# Patient Record
Sex: Female | Born: 1945
Health system: Southern US, Community
[De-identification: ages and names within clinical notes are randomized; demographics above are authoritative.]

## PROBLEM LIST (undated history)

## (undated) DIAGNOSIS — E785 Hyperlipidemia, unspecified: Secondary | ICD-10-CM

## (undated) DIAGNOSIS — I728 Aneurysm of other specified arteries: Secondary | ICD-10-CM

## (undated) DIAGNOSIS — E119 Type 2 diabetes mellitus without complications: Secondary | ICD-10-CM

## (undated) DIAGNOSIS — H269 Unspecified cataract: Secondary | ICD-10-CM

## (undated) DIAGNOSIS — G2581 Restless legs syndrome: Secondary | ICD-10-CM

## (undated) DIAGNOSIS — T7840XA Allergy, unspecified, initial encounter: Secondary | ICD-10-CM

## (undated) DIAGNOSIS — I10 Essential (primary) hypertension: Secondary | ICD-10-CM

## (undated) HISTORY — DX: Hyperlipidemia, unspecified: E78.5

## (undated) HISTORY — DX: Type 2 diabetes mellitus without complications: E11.9

## (undated) HISTORY — DX: Allergy, unspecified, initial encounter: T78.40XA

## (undated) HISTORY — DX: Aneurysm of other specified arteries: I72.8

## (undated) HISTORY — DX: Restless legs syndrome: G25.81

## (undated) HISTORY — DX: Unspecified cataract: H26.9

## (undated) HISTORY — DX: Essential (primary) hypertension: I10

## (undated) HISTORY — PX: OVARIAN CYST REMOVAL: SHX89

---

## 2012-11-11 ENCOUNTER — Ambulatory Visit (INDEPENDENT_AMBULATORY_CARE_PROVIDER_SITE_OTHER): Payer: BC Managed Care – PPO | Admitting: Family Medicine

## 2012-11-11 ENCOUNTER — Encounter: Payer: Self-pay | Admitting: Family Medicine

## 2012-11-11 VITALS — BP 124/76 | HR 94 | Temp 97.6°F | Ht 62.5 in | Wt 179.6 lb

## 2012-11-11 DIAGNOSIS — E785 Hyperlipidemia, unspecified: Secondary | ICD-10-CM | POA: Insufficient documentation

## 2012-11-11 DIAGNOSIS — I152 Hypertension secondary to endocrine disorders: Secondary | ICD-10-CM | POA: Insufficient documentation

## 2012-11-11 DIAGNOSIS — K219 Gastro-esophageal reflux disease without esophagitis: Secondary | ICD-10-CM | POA: Insufficient documentation

## 2012-11-11 DIAGNOSIS — G2581 Restless legs syndrome: Secondary | ICD-10-CM | POA: Insufficient documentation

## 2012-11-11 DIAGNOSIS — I10 Essential (primary) hypertension: Secondary | ICD-10-CM

## 2012-11-11 DIAGNOSIS — E119 Type 2 diabetes mellitus without complications: Secondary | ICD-10-CM

## 2012-11-11 DIAGNOSIS — E1159 Type 2 diabetes mellitus with other circulatory complications: Secondary | ICD-10-CM | POA: Insufficient documentation

## 2012-11-11 LAB — BASIC METABOLIC PANEL WITH GFR
BUN: 16 mg/dL (ref 6–23)
CO2: 26 mEq/L (ref 19–32)
Calcium: 9.9 mg/dL (ref 8.4–10.5)
Chloride: 104 mEq/L (ref 96–112)
Creat: 0.87 mg/dL (ref 0.50–1.10)
GFR, Est African American: 81 mL/min
GFR, Est Non African American: 71 mL/min
Glucose, Bld: 90 mg/dL (ref 70–99)
Potassium: 4.4 mEq/L (ref 3.5–5.3)
Sodium: 140 mEq/L (ref 135–145)

## 2012-11-11 LAB — HEPATIC FUNCTION PANEL
ALT: 18 U/L (ref 0–35)
AST: 17 U/L (ref 0–37)
Albumin: 4.3 g/dL (ref 3.5–5.2)
Alkaline Phosphatase: 61 U/L (ref 39–117)
Bilirubin, Direct: <0.1 mg/dL (ref 0.0–0.3)
Total Bilirubin: 0.3 mg/dL (ref 0.3–1.2)
Total Protein: 7 g/dL (ref 6.0–8.3)

## 2012-11-11 LAB — POCT UA - MICROALBUMIN: Microalbumin Ur, POC: NEGATIVE mg/dL

## 2012-11-11 LAB — POCT GLYCOSYLATED HEMOGLOBIN (HGB A1C): Hemoglobin A1C: 6.1

## 2012-11-11 MED ORDER — LISINOPRIL 5 MG PO TABS: 5.0000 mg | ORAL_TABLET | Freq: Every day | ORAL | Status: DC

## 2012-11-11 MED ORDER — PRAMIPEXOLE DIHYDROCHLORIDE 0.25 MG PO TABS: 0.2500 mg | ORAL_TABLET | Freq: Every day | ORAL | Status: DC

## 2012-11-11 MED ORDER — METFORMIN HCL 500 MG PO TABS: 500.0000 mg | ORAL_TABLET | Freq: Every day | ORAL | Status: DC

## 2012-11-11 MED ORDER — RANITIDINE HCL 150 MG PO TABS: 150.0000 mg | ORAL_TABLET | Freq: Two times a day (BID) | ORAL | Status: DC

## 2012-11-11 MED ORDER — TRAZODONE HCL 50 MG PO TABS: 50.0000 mg | ORAL_TABLET | Freq: Every day | ORAL | Status: DC

## 2012-11-11 MED ORDER — SIMVASTATIN 20 MG PO TABS: 20.0000 mg | ORAL_TABLET | Freq: Every evening | ORAL | Status: DC

## 2012-11-11 NOTE — Progress Notes (Signed)
Patient ID: Theresa Wilson, female   DOB: 02/03/1946, 67 y.o.   MRN: [id number] SUBJECTIVE: HPI: Patient is here for follow up of hypertension, hyperlipidemia/ diabetes/restless legs syndrome/GERD: denies Headache;deniesChest Pain;denies weakness;denies Shortness of Breath or Orthopnea;denies Visual changes;denies palpitations;denies cough;denies pedal edema;denies symptoms of TIA or stroke; admits to Compliance with medications. admits to Problems with medications.Blood pressure was low SBP was 80s. D/c the hctz. And  Reduced the lisinopril. Feels fine now. Came to get labs, and recheck BP.   PMH/PSH: reviewed/updated in Epic  SH/FH: reviewed/updated in Epic  Allergies: reviewed/updated in Epic  Medications: reviewed/updated in Epic  Immunizations: reviewed/updated in Epic  ROS: As above in the HPI. All other systems are stable or negative.  OBJECTIVE: APPEARANCE:  Patient in no acute distress.The patient appeared well nourished and normally developed. Acyanotic. Waist: VITAL SIGNS:BP 124/76  Pulse 94  Temp(Src) 97.6 F (36.4 C) (Oral)  Ht 5' 2.5" (1.588 m)  Wt 179 lb 9.6 oz (81.466 kg)  BMI 32.31 kg/m2  BP is tilt negative  SKIN: warm and  Dry without overt rashes, tattoos and scars  HEAD and Neck: without JVD, Head and scalp: normal Eyes:No scleral icterus. Fundi normal, eye movements normal. Ears: Auricle normal, canal normal, Tympanic membranes normal, insufflation normal. Nose: normal Throat: normal Neck & thyroid: normal  CHEST & LUNGS: Chest wall: normal Lungs: Clear  CVS: Reveals the PMI to be normally located. Regular rhythm, First and Second Heart sounds are normal,  absence of murmurs, rubs or gallops. Peripheral vasculature: Radial pulses: normal Dorsal pedis pulses: normal Posterior pulses: normal  ABDOMEN:  Appearance: normal Benign,, no organomegaly, no masses, no Abdominal Aortic enlargement. No Guarding , no rebound. No Bruits. Bowel  sounds: normal  RECTAL: N/A GU: N/A  EXTREMETIES: nonedematous. Both Femoral and Pedal pulses are normal.  MUSCULOSKELETAL:  Spine: normal Joints: intact  NEUROLOGIC: oriented to time,place and person; nonfocal. Strength is normal Sensory is normal Reflexes are normal Cranial Nerves are normal.  ASSESSMENT: HTN (hypertension) - Plan: lisinopril (PRINIVIL,ZESTRIL) 5 MG tablet, BASIC METABOLIC PANEL WITH GFR  HLD (hyperlipidemia) - Plan: simvastatin (ZOCOR) 20 MG tablet, Hepatic function panel, NMR Lipoprofile with Lipids  DM (diabetes mellitus) - Plan: metFORMIN (GLUCOPHAGE) 500 MG tablet, POCT glycosylated hemoglobin (Hb A1C), POCT UA - Microalbumin  Restless legs syndrome - Plan: traZODone (DESYREL) 50 MG tablet, pramipexole (MIRAPEX) 0.25 MG tablet  GERD (gastroesophageal reflux disease) - Plan: ranitidine (ZANTAC) 150 MG tablet    PLAN:      Dr Woodroe Mode Recommendations  Diet and Exercise discussed with patient.  For nutrition information, I recommend books:  1).Eat to Live by Dr Monico Hoar. 2).Prevent and Reverse Heart Disease by Dr Suzzette Righter.  Exercise recommendations are:  If unable to walk, then the patient can exercise in a chair 3 times a day. By flapping arms like a bird gently and raising legs outwards to the front.  If ambulatory, the patient can go for walks for 30 minutes 3 times a week. Then increase the intensity and duration as tolerated.  Goal is to try to attain exercise frequency to 5 times a week.  If applicable: Best to perform resistance exercises (machines or weights) 2 days a week and cardio type exercises 3 days per week. Orders Placed This Encounter  Procedures  . BASIC METABOLIC PANEL WITH GFR  . Hepatic function panel  . NMR Lipoprofile with Lipids  . POCT glycosylated hemoglobin (Hb A1C)  . POCT UA - Microalbumin  Results for orders placed in visit on 11/11/12 (from the past 24 hour(s))  POCT GLYCOSYLATED  HEMOGLOBIN (HGB A1C)     Status: None   Collection Time    11/11/12  4:57 PM      Result Value Range   Hemoglobin A1C 6.1    POCT UA - MICROALBUMIN     Status: None   Collection Time    11/11/12  4:58 PM      Result Value Range   Microalbumin Ur, POC neg     Meds ordered this encounter  Medications  . DISCONTD: lisinopril (PRINIVIL,ZESTRIL) 10 MG tablet    Sig: Take 5 mg by mouth daily.  Marland Kitchen DISCONTD: traZODone (DESYREL) 50 MG tablet    Sig: Take 50 mg by mouth at bedtime.  Marland Kitchen DISCONTD: pramipexole (MIRAPEX) 0.25 MG tablet    Sig: Take 0.25 mg by mouth at bedtime.  Marland Kitchen DISCONTD: ranitidine (ZANTAC) 150 MG tablet    Sig: Take 150 mg by mouth 2 (two) times daily.  Marland Kitchen DISCONTD: simvastatin (ZOCOR) 20 MG tablet    Sig: Take 20 mg by mouth every evening.  Marland Kitchen DISCONTD: metFORMIN (GLUCOPHAGE) 500 MG tablet    Sig: Take 500 mg by mouth at bedtime.  . traZODone (DESYREL) 50 MG tablet    Sig: Take 1 tablet (50 mg total) by mouth at bedtime.    Dispense:  30 tablet    Refill:  5  . simvastatin (ZOCOR) 20 MG tablet    Sig: Take 1 tablet (20 mg total) by mouth every evening.    Dispense:  30 tablet    Refill:  11  . ranitidine (ZANTAC) 150 MG tablet    Sig: Take 1 tablet (150 mg total) by mouth 2 (two) times daily.    Dispense:  30 tablet    Refill:  11  . pramipexole (MIRAPEX) 0.25 MG tablet    Sig: Take 1 tablet (0.25 mg total) by mouth at bedtime.    Dispense:  30 tablet    Refill:  11  . metFORMIN (GLUCOPHAGE) 500 MG tablet    Sig: Take 1 tablet (500 mg total) by mouth at bedtime.    Dispense:  30 tablet    Refill:  11  . lisinopril (PRINIVIL,ZESTRIL) 5 MG tablet    Sig: Take 1 tablet (5 mg total) by mouth daily.    Dispense:  90 tablet    Refill:  3  RTC in 4 months.  Coraleigh Sheeran P. Modesto Charon, M.D.

## 2012-11-11 NOTE — Patient Instructions (Addendum)
      Dr Yashica Sterbenz's Recommendations  Diet and Exercise discussed with patient.  For nutrition information, I recommend books:  1).Eat to Live by Dr Joel Fuhrman. 2).Prevent and Reverse Heart Disease by Dr Caldwell Esselstyn.  Exercise recommendations are:  If unable to walk, then the patient can exercise in a chair 3 times a day. By flapping arms like a bird gently and raising legs outwards to the front.  If ambulatory, the patient can go for walks for 30 minutes 3 times a week. Then increase the intensity and duration as tolerated.  Goal is to try to attain exercise frequency to 5 times a week.  If applicable: Best to perform resistance exercises (machines or weights) 2 days a week and cardio type exercises 3 days per week.  

## 2012-11-12 LAB — NMR LIPOPROFILE WITH LIPIDS
Cholesterol, Total: 178 mg/dL (ref <200)
HDL Particle Number: 36.6 umol/L (ref >=30.5)
HDL Size: 8.4 nm — ABNORMAL LOW (ref >=9.2)
HDL-C: 51 mg/dL (ref >=40)
LDL (calc): 77 mg/dL (ref <100)
LDL Particle Number: 1608 nmol/L — ABNORMAL HIGH (ref <1000)
LDL Size: 20 nm — ABNORMAL LOW (ref >20.5)
LP-IR Score: 84 — ABNORMAL HIGH (ref <=45)
Large HDL-P: 1.6 umol/L — ABNORMAL LOW (ref >=4.8)
Large VLDL-P: 9.7 nmol/L — ABNORMAL HIGH (ref <=2.7)
Small LDL Particle Number: 1125 nmol/L — ABNORMAL HIGH (ref <=527)
Triglycerides: 252 mg/dL — ABNORMAL HIGH (ref <150)
VLDL Size: 53.1 nm — ABNORMAL HIGH (ref <=46.6)

## 2012-12-13 ENCOUNTER — Telehealth: Payer: Self-pay | Admitting: Family Medicine

## 2012-12-13 ENCOUNTER — Ambulatory Visit (INDEPENDENT_AMBULATORY_CARE_PROVIDER_SITE_OTHER): Payer: BC Managed Care – PPO | Admitting: General Practice

## 2012-12-13 ENCOUNTER — Encounter: Payer: Self-pay | Admitting: General Practice

## 2012-12-13 VITALS — BP 130/76 | HR 86 | Temp 97.1°F | Ht 63.0 in | Wt 179.0 lb

## 2012-12-13 DIAGNOSIS — L259 Unspecified contact dermatitis, unspecified cause: Secondary | ICD-10-CM

## 2012-12-13 DIAGNOSIS — L237 Allergic contact dermatitis due to plants, except food: Secondary | ICD-10-CM

## 2012-12-13 DIAGNOSIS — L255 Unspecified contact dermatitis due to plants, except food: Secondary | ICD-10-CM

## 2012-12-13 MED ORDER — PREDNISONE (PAK) 10 MG PO TABS: ORAL_TABLET | ORAL | Status: DC

## 2012-12-13 MED ORDER — METHYLPREDNISOLONE ACETATE 40 MG/ML IJ SUSP
40.0000 mg | Freq: Once | INTRAMUSCULAR | Status: AC
  Administered 2012-12-13: 40 mg via INTRAMUSCULAR

## 2012-12-13 NOTE — Telephone Encounter (Signed)
appt given  

## 2012-12-13 NOTE — Progress Notes (Signed)
  Subjective:    Patient ID: Theresa Wilson, female    DOB: Apr 02, 1946, 67 y.o.   MRN: [id number]  Poison [place] This is a new problem. The current episode started in the past 7 days. The problem has been gradually worsening since onset. The affected locations include the left upper leg, right upper leg, left arm and right arm. The rash is characterized by itchiness, redness and blistering. She was exposed to plant contact. Pertinent negatives include no congestion, cough, fever, rhinorrhea, shortness of breath or sore throat. Past treatments include antihistamine. There is no history of allergies or asthma.  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 Itching and rash was noticed on Tuesday or Wednesday.     Review of Systems  Constitutional: Negative for fever and chills.  HENT: Negative for congestion, sore throat and rhinorrhea.   Respiratory: Negative for cough, chest tightness and shortness of breath.   Cardiovascular: Negative for chest pain and palpitations.  Skin: Positive for rash.       Red rash to upper legs and upper arms  Neurological: Negative for dizziness and headaches.  Psychiatric/Behavioral: Negative.        Objective:   Physical Exam  Constitutional: She is oriented to person, place, and time. She appears well-developed and well-nourished.  Cardiovascular: Normal rate, regular rhythm and normal heart sounds.   Pulmonary/Chest: Effort normal and breath sounds normal. No respiratory distress. She exhibits no tenderness.  Neurological: She is alert and oriented to person, place, and time.  Skin: Skin is warm and dry. Rash noted.  Macular rash noted to upper legs and arms  Psychiatric: She has a normal mood and affect.          Assessment & Plan:  1. Poison oak dermatitis  - methylPREDNISolone acetate (DEPO-MEDROL) injection 40 mg; Inject 1 mL (40 mg total) into the muscle once. - predniSONE (STERAPRED UNI-PAK) 10 MG tablet; Take as directed. Start on  tomorrow, 12/14/12  Dispense: 21 tablet; Refill: 0  Avoid irritants Discussed prevention of spreading RTO is symptoms worsen or go to nearest emergency department Patient verbalized understanding Coralie Keens, FNP-C

## 2012-12-13 NOTE — Patient Instructions (Signed)
Poison Oak Poison oak is an inflammation of the skin (contact dermatitis). It is caused by contact with the allergens on the leaves of the oak (toxicodendron) plants. Depending on your sensitivity, the rash may consist simply of redness and itching, or it may also progress to blisters which may break open (rupture). These must be well cared for to prevent secondary germ (bacterial) infection as these infections can lead to scarring. The eyes may also get puffy. The puffiness is worst in the morning and gets better as the day progresses. Healing is best accomplished by keeping any open areas dry, clean, covered with a bandage, and covered with an antibacterial ointment if needed. Without secondary infection, this dermatitis usually heals without scarring within 2 to 3 weeks without treatment. HOME CARE INSTRUCTIONS When you have been exposed to poison oak, it is very important to thoroughly wash with soap and water as soon as the exposure has been discovered. You have about one half hour to remove the plant resin before it will cause the rash. This cleaning will quickly destroy the oil or antigen on the skin (the antigen is what causes the rash). Wash aggressively under the fingernails as any plant resin still there will continue to spread the rash. Do not rub skin vigorously when washing affected area. Poison oak cannot spread if no oil from the plant remains on your body. Rash that has progressed to weeping sores (lesions) will not spread the rash unless you have not washed thoroughly. It is also important to clean any clothes you have been wearing as they may carry active allergens which will spread the rash, even several days later. Avoidance of the plant in the future is the best measure. Poison oak plants can be recognized by the number of leaves. Generally, poison oak has three leaves with flowering branches on a single stem. Diphenhydramine may be purchased over the counter and used as needed for  itching. Do not drive with this medication if it makes you drowsy. Ask your caregiver about medication for children. SEEK IMMEDIATE MEDICAL CARE IF:   Open areas of the rash develop.  You notice redness extending beyond the area of the rash.  There is a pus like discharge.  There is increased pain.  Other signs of infection develop (such as fever). Document Released: 01/14/2003 Document Revised: 10/02/2011 Document Reviewed: 05/26/2009 [employer] Patient Information 2014 [employer].  

## 2013-03-13 ENCOUNTER — Encounter: Payer: Self-pay | Admitting: Family Medicine

## 2013-03-13 ENCOUNTER — Ambulatory Visit (INDEPENDENT_AMBULATORY_CARE_PROVIDER_SITE_OTHER): Payer: BC Managed Care – PPO

## 2013-03-13 ENCOUNTER — Ambulatory Visit (INDEPENDENT_AMBULATORY_CARE_PROVIDER_SITE_OTHER): Payer: BC Managed Care – PPO | Admitting: Family Medicine

## 2013-03-13 VITALS — BP 128/76 | HR 100 | Temp 98.1°F | Wt 178.0 lb

## 2013-03-13 DIAGNOSIS — I10 Essential (primary) hypertension: Secondary | ICD-10-CM

## 2013-03-13 DIAGNOSIS — Z7722 Contact with and (suspected) exposure to environmental tobacco smoke (acute) (chronic): Secondary | ICD-10-CM

## 2013-03-13 DIAGNOSIS — G2581 Restless legs syndrome: Secondary | ICD-10-CM

## 2013-03-13 DIAGNOSIS — E785 Hyperlipidemia, unspecified: Secondary | ICD-10-CM

## 2013-03-13 DIAGNOSIS — E119 Type 2 diabetes mellitus without complications: Secondary | ICD-10-CM

## 2013-03-13 DIAGNOSIS — M549 Dorsalgia, unspecified: Secondary | ICD-10-CM

## 2013-03-13 DIAGNOSIS — K219 Gastro-esophageal reflux disease without esophagitis: Secondary | ICD-10-CM

## 2013-03-13 DIAGNOSIS — Z9189 Other specified personal risk factors, not elsewhere classified: Secondary | ICD-10-CM

## 2013-03-13 LAB — POCT GLYCOSYLATED HEMOGLOBIN (HGB A1C): Hemoglobin A1C: 5.9

## 2013-03-13 MED ORDER — TRAZODONE HCL 50 MG PO TABS: 50.0000 mg | ORAL_TABLET | Freq: Every day | ORAL | Status: DC

## 2013-03-13 MED ORDER — MELOXICAM 15 MG PO TABS: 15.0000 mg | ORAL_TABLET | Freq: Every day | ORAL | Status: DC

## 2013-03-13 NOTE — Progress Notes (Signed)
Patient ID: Theresa Wilson, female   DOB: 03-19-1946, 67 y.o.   MRN: [id number] SUBJECTIVE: CC: Chief Complaint  Patient presents with  . Follow-up    4 month follow up refill trazadone  now sewing and c/o back pain and wants to discuss metformin    HPI:  Patient is here for follow up of Diabetes Mellitus: Symptoms evaluated: Denies Nocturia ,Denies Urinary Frequency , denies Blurred vision ,deniesDizziness,denies.Dysuria,denies paresthesias, denies extremity pain or ulcers.Marland Kitchendenies chest pain. has had an annual eye exam. do check the feet. Does check CBGs. Average CBG:98 Denies episodes of hypoglycemia. Does have an emergency hypoglycemic plan. admits toCompliance with medications. Denies Problems with medications.  Past Medical History  Diagnosis Date  . Diabetes mellitus without complication   . Hypertension   . Hyperlipidemia   . Restless leg syndrome    Past Surgical History  Procedure Laterality Date  . Ovarian cyst removal     History   Social History  . Marital Status: Divorced    Spouse Name: N/A    Number of Children: N/A  . Years of Education: N/A   Occupational History  . Not on file.   Social History Main Topics  . Smoking status: Former Smoker    Quit date: 11/11/2008  . Smokeless tobacco: Not on file  . Alcohol Use: No  . Drug Use: No  . Sexual Activity: Not on file   Other Topics Concern  . Not on file   Social History Narrative  . No narrative on file   Family History  Problem Relation Age of Onset  . Heart attack Mother   . Hypertension Father   . Heart disease Father    Current Outpatient Prescriptions on File Prior to Visit  Medication Sig Dispense Refill  . lisinopril (PRINIVIL,ZESTRIL) 5 MG tablet Take 1 tablet (5 mg total) by mouth daily.  90 tablet  3  . metFORMIN (GLUCOPHAGE) 500 MG tablet Take 1 tablet (500 mg total) by mouth at bedtime.  30 tablet  11  . pramipexole (MIRAPEX) 0.25 MG tablet Take 1 tablet (0.25 mg total) by  mouth at bedtime.  30 tablet  11  . ranitidine (ZANTAC) 150 MG tablet Take 1 tablet (150 mg total) by mouth 2 (two) times daily.  30 tablet  11  . simvastatin (ZOCOR) 20 MG tablet Take 1 tablet (20 mg total) by mouth every evening.  30 tablet  11  . traZODone (DESYREL) 50 MG tablet Take 1 tablet (50 mg total) by mouth at bedtime.  30 tablet  5   No current facility-administered medications on file prior to visit.   Allergies  Allergen Reactions  . Sulfa Antibiotics     There is no immunization history on file for this patient. Prior to Admission medications   Medication Sig Start Date End Date Taking? Authorizing Provider  lisinopril (PRINIVIL,ZESTRIL) 5 MG tablet Take 1 tablet (5 mg total) by mouth daily. 11/11/12  Yes Ileana Ladd, MD  metFORMIN (GLUCOPHAGE) 500 MG tablet Take 1 tablet (500 mg total) by mouth at bedtime. 11/11/12  Yes Ileana Ladd, MD  pramipexole (MIRAPEX) 0.25 MG tablet Take 1 tablet (0.25 mg total) by mouth at bedtime. 11/11/12  Yes Ileana Ladd, MD  ranitidine (ZANTAC) 150 MG tablet Take 1 tablet (150 mg total) by mouth 2 (two) times daily. 11/11/12  Yes Ileana Ladd, MD  simvastatin (ZOCOR) 20 MG tablet Take 1 tablet (20 mg total) by mouth every evening. 11/11/12  Yes  Ileana Ladd, MD  traZODone (DESYREL) 50 MG tablet Take 1 tablet (50 mg total) by mouth at bedtime. 11/11/12  Yes Ileana Ladd, MD     ROS: As above in the HPI. All other systems are stable or negative.  OBJECTIVE: APPEARANCE:  Patient in no acute distress.The patient appeared well nourished and normally developed. Acyanotic. Waist: VITAL SIGNS:BP 128/76  Pulse 100  Temp(Src) 98.1 F (36.7 C) (Oral)  Wt 178 lb (80.74 kg)  BMI 31.54 kg/m2  WF  SKIN: warm and  Dry without overt rashes, tattoos and scars  HEAD and Neck: without JVD, Head and scalp: normal Eyes:No scleral icterus. Fundi normal, eye movements normal. Ears: Auricle normal, canal normal, Tympanic membranes normal,  insufflation normal. Nose: normal Throat: normal Neck & thyroid: normal  CHEST & LUNGS: Chest wall: normal Lungs: Clear  CVS: Reveals the PMI to be normally located. Regular rhythm, First and Second Heart sounds are normal,  absence of murmurs, rubs or gallops. Peripheral vasculature: Radial pulses: normal Dorsal pedis pulses: normal Posterior pulses: normal  ABDOMEN:  Appearance: overweight Benign, no organomegaly, no masses, no Abdominal Aortic enlargement. No Guarding , no rebound. No Bruits. Bowel sounds: normal  RECTAL: N/A GU: N/A  EXTREMETIES: nonedematous.  MUSCULOSKELETAL:  Spine: normal Joints: intact  NEUROLOGIC: oriented to time,place and person; nonfocal. Strength is normal Sensory is normal Reflexes are normal Cranial Nerves are normal.  ASSESSMENT: Second hand smoke exposure - Plan: DG Chest 2 View  GERD (gastroesophageal reflux disease)  HLD (hyperlipidemia) - Plan: CMP14+EGFR, NMR, lipoprofile  HTN (hypertension)  Restless legs syndrome - Plan: traZODone (DESYREL) 50 MG tablet  DM (diabetes mellitus) - Plan: POCT glycosylated hemoglobin (Hb A1C), CMP14+EGFR  Back pain - Plan: meloxicam (MOBIC) 15 MG tablet  PLAN: [facility] reading (PRIMARY) by  Dr. Modesto Charon: no acute disease seen.  Orders Placed This Encounter  Procedures  . DG Chest 2 View    Standing Status: Future     Number of Occurrences: 1     Standing Expiration Date: 05/13/2014    Order Specific Question:  Reason for Exam (SYMPTOM  OR DIAGNOSIS REQUIRED)    Answer:  second hand smoke    Order Specific Question:  Preferred imaging location?    Answer:  Internal  . CMP14+EGFR  . NMR, lipoprofile  . POCT glycosylated hemoglobin (Hb A1C)    Meds ordered this encounter  Medications  . meloxicam (MOBIC) 15 MG tablet    Sig: Take 1 tablet (15 mg total) by mouth daily.    Dispense:  30 tablet    Refill:  0  . traZODone (DESYREL) 50 MG tablet    Sig: Take 1 tablet (50 mg total) by  mouth at bedtime.    Dispense:  30 tablet    Refill:  5    Medications Discontinued During This Encounter  Medication Reason  . predniSONE (STERAPRED UNI-PAK) 10 MG tablet Completed Course  . traZODone (DESYREL) 50 MG tablet Reorder    Results for orders placed in visit on 03/13/13  POCT GLYCOSYLATED HEMOGLOBIN (HGB A1C)      Result Value Range   Hemoglobin A1C 5.9%           Dr Woodroe Mode Recommendations  For nutrition information, I recommend books:  1).Eat to Live by Dr Monico Hoar. 2).Prevent and Reverse Heart Disease by Dr Suzzette Righter. 3) Dr Katherina Right Book:  Program to Reverse Diabetes  Exercise recommendations are:  If unable to walk, then the patient  can exercise in a chair 3 times a day. By flapping arms like a bird gently and raising legs outwards to the front.  If ambulatory, the patient can go for walks for 30 minutes 3 times a week. Then increase the intensity and duration as tolerated.  Goal is to try to attain exercise frequency to 5 times a week.  If applicable: Best to perform resistance exercises (machines or weights) 2 days a week and cardio type exercises 3 days per week.  Return in about 4 months (around 07/13/2013) for Recheck medical problems.  Jesi Jurgens P. Modesto Charon, M.D.

## 2013-03-13 NOTE — Patient Instructions (Addendum)
      Dr Francis Wong's Recommendations  For nutrition information, I recommend books:  1).Eat to Live by Dr Joel Fuhrman. 2).Prevent and Reverse Heart Disease by Dr Caldwell Esselstyn. 3) Dr Neal Barnard's Book:  Program to Reverse Diabetes  Exercise recommendations are:  If unable to walk, then the patient can exercise in a chair 3 times a day. By flapping arms like a bird gently and raising legs outwards to the front.  If ambulatory, the patient can go for walks for 30 minutes 3 times a week. Then increase the intensity and duration as tolerated.  Goal is to try to attain exercise frequency to 5 times a week.  If applicable: Best to perform resistance exercises (machines or weights) 2 days a week and cardio type exercises 3 days per week.  

## 2013-03-14 LAB — CMP14+EGFR
ALT: 19 IU/L (ref 0–32)
AST: 18 IU/L (ref 0–40)
Albumin/Globulin Ratio: 2.3 (ref 1.1–2.5)
Albumin: 4.5 g/dL (ref 3.6–4.8)
Alkaline Phosphatase: 70 IU/L (ref 39–117)
BUN/Creatinine Ratio: 19 (ref 11–26)
BUN: 16 mg/dL (ref 8–27)
CO2: 23 mmol/L (ref 18–29)
Calcium: 9.9 mg/dL (ref 8.6–10.2)
Chloride: 100 mmol/L (ref 97–108)
Creatinine, Ser: 0.85 mg/dL (ref 0.57–1.00)
GFR calc Af Amer: 84 mL/min/{1.73_m2} (ref >59)
GFR calc non Af Amer: 73 mL/min/{1.73_m2} (ref >59)
Globulin, Total: 2 g/dL (ref 1.5–4.5)
Glucose: 136 mg/dL — ABNORMAL HIGH (ref 65–99)
Potassium: 4.2 mmol/L (ref 3.5–5.2)
Sodium: 139 mmol/L (ref 134–144)
Total Bilirubin: 0.2 mg/dL (ref 0.0–1.2)
Total Protein: 6.5 g/dL (ref 6.0–8.5)

## 2013-03-14 LAB — NMR, LIPOPROFILE
Cholesterol: 178 mg/dL (ref <200)
HDL Cholesterol by NMR: 48 mg/dL (ref >=40)
HDL Particle Number: 29.9 umol/L — ABNORMAL LOW (ref >=30.5)
LDL Particle Number: 1364 nmol/L — ABNORMAL HIGH (ref <1000)
LDL Size: 20 nm — ABNORMAL LOW (ref >20.5)
LDLC SERPL CALC-MCNC: 78 mg/dL (ref <100)
LP-IR Score: 78 — ABNORMAL HIGH (ref <=45)
Small LDL Particle Number: 986 nmol/L — ABNORMAL HIGH (ref <=527)
Triglycerides by NMR: 259 mg/dL — ABNORMAL HIGH (ref <150)

## 2013-03-14 NOTE — Progress Notes (Signed)
Quick Note:  Lab result at or close to goal. Can reduce the metformin to 1/2 tablet daily The triglycerides were very high and that is due to carbohydrates and sugars. We can wait to see the effect of dieting does or we can start on omega-3 fish oil 2 gm twice a Day OTC. No Change in plans for follow up. ______

## 2013-03-18 ENCOUNTER — Telehealth: Payer: Self-pay | Admitting: Family Medicine

## 2013-03-19 NOTE — Telephone Encounter (Signed)
Called several times and called this am and left her VM on her cell phone with Dr Nash Dimmer recommendations and copy of labs mailed to pt. Advised to return call after receiving labs if any questions

## 2013-04-08 ENCOUNTER — Other Ambulatory Visit: Payer: Self-pay | Admitting: Family Medicine

## 2013-04-09 NOTE — Telephone Encounter (Signed)
LAST OV 03/13/13

## 2013-04-09 NOTE — Telephone Encounter (Signed)
Prescription renewed in EPIC. 

## 2013-05-07 ENCOUNTER — Other Ambulatory Visit: Payer: Self-pay | Admitting: Family Medicine

## 2013-05-29 ENCOUNTER — Other Ambulatory Visit: Payer: Self-pay

## 2013-07-15 ENCOUNTER — Ambulatory Visit (INDEPENDENT_AMBULATORY_CARE_PROVIDER_SITE_OTHER): Payer: Medicare PPO | Admitting: Family Medicine

## 2013-07-15 ENCOUNTER — Encounter: Payer: Self-pay | Admitting: Family Medicine

## 2013-07-15 VITALS — BP 149/84 | HR 82 | Temp 97.4°F | Ht 63.0 in | Wt 171.2 lb

## 2013-07-15 DIAGNOSIS — G2581 Restless legs syndrome: Secondary | ICD-10-CM

## 2013-07-15 DIAGNOSIS — Z23 Encounter for immunization: Secondary | ICD-10-CM | POA: Insufficient documentation

## 2013-07-15 DIAGNOSIS — E785 Hyperlipidemia, unspecified: Secondary | ICD-10-CM

## 2013-07-15 DIAGNOSIS — K219 Gastro-esophageal reflux disease without esophagitis: Secondary | ICD-10-CM

## 2013-07-15 DIAGNOSIS — E119 Type 2 diabetes mellitus without complications: Secondary | ICD-10-CM

## 2013-07-15 DIAGNOSIS — I10 Essential (primary) hypertension: Secondary | ICD-10-CM

## 2013-07-15 LAB — POCT GLYCOSYLATED HEMOGLOBIN (HGB A1C): Hemoglobin A1C: 5.7

## 2013-07-15 MED ORDER — PNEUMOCOCCAL 13-VAL CONJ VACC IM SUSP: 0.5000 mL | Freq: Once | INTRAMUSCULAR | Status: DC

## 2013-07-15 NOTE — Patient Instructions (Signed)

## 2013-07-15 NOTE — Progress Notes (Signed)
Patient ID: Theresa Wilson, female   DOB: October 25, 1945, 67 y.o.   MRN: [id number] SUBJECTIVE: CC: Chief Complaint  Patient presents with  . Follow-up    states stopped taking bp med  ck fiingers     HPI: Patient is here for follow up of Diabetes Mellitus/HLD/HTN: Symptoms evaluated: Denies Nocturia ,Denies Urinary Frequency , denies Blurred vision ,deniesDizziness,denies.Dysuria,denies paresthesias, denies extremity pain or ulcers.Marland Kitchendenies chest pain. has had an annual eye exam. do check the feet. Does check CBGs. Average CBG:106 Denies episodes of hypoglycemia. Does have an emergency hypoglycemic plan. admits toCompliance with medications. Problems with medications.was dizzy and sick every morning with a low BP, so she stopped the lisinopril   Past Medical History  Diagnosis Date  . Diabetes mellitus without complication   . Hypertension   . Hyperlipidemia   . Restless leg syndrome    Past Surgical History  Procedure Laterality Date  . Ovarian cyst removal     History   Social History  . Marital Status: Divorced    Spouse Name: N/A    Number of Children: N/A  . Years of Education: N/A   Occupational History  . Not on file.   Social History Main Topics  . Smoking status: Former Smoker    Quit date: 11/11/2008  . Smokeless tobacco: Not on file  . Alcohol Use: No  . Drug Use: No  . Sexual Activity: Not on file   Other Topics Concern  . Not on file   Social History Narrative  . No narrative on file   Family History  Problem Relation Age of Onset  . Heart attack Mother   . Hypertension Father   . Heart disease Father    Current Outpatient Prescriptions on File Prior to Visit  Medication Sig Dispense Refill  . meloxicam (MOBIC) 15 MG tablet TAKE 1 TABLET (15 MG TOTAL) BY MOUTH DAILY.  30 tablet  3  . pramipexole (MIRAPEX) 0.25 MG tablet Take 1 tablet (0.25 mg total) by mouth at bedtime.  30 tablet  11  . ranitidine (ZANTAC) 150 MG tablet Take 1 tablet (150 mg  total) by mouth 2 (two) times daily.  30 tablet  11  . simvastatin (ZOCOR) 20 MG tablet Take 1 tablet (20 mg total) by mouth every evening.  30 tablet  11  . traZODone (DESYREL) 50 MG tablet Take 1 tablet (50 mg total) by mouth at bedtime.  30 tablet  5   No current facility-administered medications on file prior to visit.   Allergies  Allergen Reactions  . Sulfa Antibiotics    Immunization History  Administered Date(s) Administered  . Influenza-Unspecified 04/23/2013  . Pneumococcal Conjugate-13 07/15/2013   Prior to Admission medications   Medication Sig Start Date End Date Taking? Authorizing Provider  lisinopril (PRINIVIL,ZESTRIL) 5 MG tablet Take 1 tablet (5 mg total) by mouth daily. 11/11/12   Ileana Ladd, MD  meloxicam (MOBIC) 15 MG tablet TAKE 1 TABLET (15 MG TOTAL) BY MOUTH DAILY. 04/08/13   Ileana Ladd, MD  metFORMIN (GLUCOPHAGE) 500 MG tablet Take 1 tablet (500 mg total) by mouth at bedtime. 11/11/12   Ileana Ladd, MD  pramipexole (MIRAPEX) 0.25 MG tablet Take 1 tablet (0.25 mg total) by mouth at bedtime. 11/11/12   Ileana Ladd, MD  ranitidine (ZANTAC) 150 MG tablet Take 1 tablet (150 mg total) by mouth 2 (two) times daily. 11/11/12   Ileana Ladd, MD  simvastatin (ZOCOR) 20 MG tablet Take 1 tablet (  20 mg total) by mouth every evening. 11/11/12   Ileana Ladd, MD  traZODone (DESYREL) 50 MG tablet Take 1 tablet (50 mg total) by mouth at bedtime. 03/13/13   Ileana Ladd, MD     ROS: As above in the HPI. All other systems are stable or negative.  OBJECTIVE: APPEARANCE:  Patient in no acute distress.The patient appeared well nourished and normally developed. Acyanotic. Waist: VITAL SIGNS:BP 149/84  Pulse 82  Temp(Src) 97.4 F (36.3 C) (Oral)  Ht 5\' 3"  (1.6 m)  Wt 171 lb 3.2 oz (77.656 kg)  BMI 30.33 kg/m2 WF  SKIN: warm and  Dry without overt rashes, tattoos and scars  HEAD and Neck: without JVD, Head and scalp: normal Eyes:No scleral icterus. Fundi  normal, eye movements normal. Ears: Auricle normal, canal normal, Tympanic membranes normal, insufflation normal. Nose: normal Throat: normal Neck & thyroid: normal  CHEST & LUNGS: Chest wall: normal Lungs: Clear  CVS: Reveals the PMI to be normally located. Regular rhythm, First and Second Heart sounds are normal,  absence of murmurs, rubs or gallops. Peripheral vasculature: Radial pulses: normal Dorsal pedis pulses: normal Posterior pulses: normal  ABDOMEN:  Appearance: obese Benign, no organomegaly, no masses, no Abdominal Aortic enlargement. No Guarding , no rebound. No Bruits. Bowel sounds: normal  RECTAL: N/A GU: N/A  EXTREMETIES: nonedematous.  MUSCULOSKELETAL:  Spine: normal Joints: intact  NEUROLOGIC: oriented to time,place and person; nonfocal. Strength is normal Sensory is normal Reflexes are normal Cranial Nerves are normal.  ASSESSMENT: DM (diabetes mellitus) - Plan: POCT glycosylated hemoglobin (Hb A1C)  HTN (hypertension) - Plan: CMP14+EGFR  HLD (hyperlipidemia) - Plan: CMP14+EGFR, NMR, lipoprofile  Restless legs syndrome  GERD (gastroesophageal reflux disease)  Need for pneumococcal vaccination - Plan: pneumococcal 13-valent conjugate vaccine (PREVNAR 13) injection 0.5 mL  Off of lisinopril.  PLAN: Discussed diet and exercise.  Monitor BP daily.  Orders Placed This Encounter  Procedures  . CMP14+EGFR  . NMR, lipoprofile  . POCT glycosylated hemoglobin (Hb A1C)   Meds ordered this encounter  Medications  . metFORMIN (GLUCOPHAGE) 500 MG tablet    Sig: Take 250 mg by mouth.  . pneumococcal 13-valent conjugate vaccine (PREVNAR 13) injection 0.5 mL    Sig:    Medications Discontinued During This Encounter  Medication Reason  . metFORMIN (GLUCOPHAGE) 500 MG tablet   . lisinopril (PRINIVIL,ZESTRIL) 5 MG tablet Error   Return in about 4 months (around 11/13/2013), or if symptoms worsen or fail to improve, for Recheck medical  problems.  Linzy Darling P. [place] Charon, M.D.

## 2013-07-16 LAB — NMR, LIPOPROFILE
Cholesterol: 175 mg/dL (ref <200)
HDL Cholesterol by NMR: 52 mg/dL (ref >=40)
HDL Particle Number: 30.6 umol/L (ref >=30.5)
LDL Particle Number: 1337 nmol/L — ABNORMAL HIGH (ref <1000)
LDL Size: 20.4 nm — ABNORMAL LOW (ref >20.5)
LDLC SERPL CALC-MCNC: 95 mg/dL (ref <100)
LP-IR Score: 57 — ABNORMAL HIGH (ref <=45)
Small LDL Particle Number: 716 nmol/L — ABNORMAL HIGH (ref <=527)
Triglycerides by NMR: 140 mg/dL (ref <150)

## 2013-07-16 LAB — CMP14+EGFR
ALT: 18 IU/L (ref 0–32)
AST: 16 IU/L (ref 0–40)
Albumin/Globulin Ratio: 2.1 (ref 1.1–2.5)
Albumin: 4.8 g/dL (ref 3.6–4.8)
Alkaline Phosphatase: 99 IU/L (ref 39–117)
BUN/Creatinine Ratio: 15 (ref 11–26)
BUN: 14 mg/dL (ref 8–27)
CO2: 24 mmol/L (ref 18–29)
Calcium: 10.1 mg/dL (ref 8.6–10.2)
Chloride: 98 mmol/L (ref 97–108)
Creatinine, Ser: 0.93 mg/dL (ref 0.57–1.00)
GFR calc Af Amer: 75 mL/min/{1.73_m2} (ref >59)
GFR calc non Af Amer: 65 mL/min/{1.73_m2} (ref >59)
Globulin, Total: 2.3 g/dL (ref 1.5–4.5)
Glucose: 81 mg/dL (ref 65–99)
Potassium: 4.1 mmol/L (ref 3.5–5.2)
Sodium: 140 mmol/L (ref 134–144)
Total Bilirubin: 0.3 mg/dL (ref 0.0–1.2)
Total Protein: 7.1 g/dL (ref 6.0–8.5)

## 2013-08-08 ENCOUNTER — Other Ambulatory Visit: Payer: Self-pay | Admitting: Family Medicine

## 2013-09-30 ENCOUNTER — Other Ambulatory Visit: Payer: Self-pay | Admitting: Family Medicine

## 2013-10-27 ENCOUNTER — Other Ambulatory Visit: Payer: Self-pay | Admitting: Family Medicine

## 2013-10-28 LAB — HM DIABETES EYE EXAM

## 2013-10-31 NOTE — Telephone Encounter (Signed)
Call patient : Prescription refilled & sent to pharmacy in [place]. 

## 2013-11-17 ENCOUNTER — Other Ambulatory Visit: Payer: Self-pay | Admitting: Family Medicine

## 2013-11-18 NOTE — Telephone Encounter (Signed)
Last seen and last lipid 07/15/13  [facility]

## 2013-11-19 NOTE — Telephone Encounter (Signed)
Call patient : Prescription refilled & sent to pharmacy in [place]. 

## 2014-01-20 ENCOUNTER — Telehealth: Payer: Self-pay | Admitting: Family Medicine

## 2014-01-20 NOTE — Telephone Encounter (Signed)
Pt aware.

## 2014-01-20 NOTE — Telephone Encounter (Signed)
Patient states that her insurance sent her a letter stating that Dr.moore would take over her care she wants an appointment with him and all of her medicines she is out of. I called  To an appointment for diabetes intaitive. And she wanted it with Dr.Moore because of this letter.

## 2014-01-21 ENCOUNTER — Ambulatory Visit (INDEPENDENT_AMBULATORY_CARE_PROVIDER_SITE_OTHER): Payer: Medicare PPO | Admitting: Family Medicine

## 2014-01-21 ENCOUNTER — Encounter: Payer: Self-pay | Admitting: Family Medicine

## 2014-01-21 VITALS — BP 138/83 | HR 90 | Temp 98.1°F | Ht 63.0 in | Wt 182.0 lb

## 2014-01-21 DIAGNOSIS — M25549 Pain in joints of unspecified hand: Secondary | ICD-10-CM

## 2014-01-21 DIAGNOSIS — Z13820 Encounter for screening for osteoporosis: Secondary | ICD-10-CM

## 2014-01-21 DIAGNOSIS — I10 Essential (primary) hypertension: Secondary | ICD-10-CM

## 2014-01-21 DIAGNOSIS — M6530 Trigger finger, unspecified finger: Secondary | ICD-10-CM | POA: Insufficient documentation

## 2014-01-21 DIAGNOSIS — E119 Type 2 diabetes mellitus without complications: Secondary | ICD-10-CM

## 2014-01-21 DIAGNOSIS — M67919 Unspecified disorder of synovium and tendon, unspecified shoulder: Secondary | ICD-10-CM

## 2014-01-21 DIAGNOSIS — K219 Gastro-esophageal reflux disease without esophagitis: Secondary | ICD-10-CM

## 2014-01-21 DIAGNOSIS — E559 Vitamin D deficiency, unspecified: Secondary | ICD-10-CM

## 2014-01-21 DIAGNOSIS — M778 Other enthesopathies, not elsewhere classified: Secondary | ICD-10-CM

## 2014-01-21 DIAGNOSIS — Z87891 Personal history of nicotine dependence: Secondary | ICD-10-CM

## 2014-01-21 DIAGNOSIS — M719 Bursopathy, unspecified: Secondary | ICD-10-CM

## 2014-01-21 DIAGNOSIS — M7581 Other shoulder lesions, right shoulder: Secondary | ICD-10-CM

## 2014-01-21 DIAGNOSIS — M25541 Pain in joints of right hand: Secondary | ICD-10-CM | POA: Insufficient documentation

## 2014-01-21 DIAGNOSIS — M25542 Pain in joints of left hand: Secondary | ICD-10-CM

## 2014-01-21 DIAGNOSIS — Z801 Family history of malignant neoplasm of trachea, bronchus and lung: Secondary | ICD-10-CM

## 2014-01-21 DIAGNOSIS — E785 Hyperlipidemia, unspecified: Secondary | ICD-10-CM

## 2014-01-21 LAB — POCT CBC
GRANULOCYTE PERCENT: 54.9 % (ref 37–80)
HCT, POC: 45.5 % (ref 37.7–47.9)
Hemoglobin: 15.1 g/dL (ref 12.2–16.2)
LYMPH, POC: 3.3 (ref 0.6–3.4)
MCH, POC: 29.5 pg (ref 27–31.2)
MCHC: 33.1 g/dL (ref 31.8–35.4)
MCV: 89 fL (ref 80–97)
MPV: 7.7 fL (ref 0–99.8)
POC GRANULOCYTE: 4.3 (ref 2–6.9)
POC LYMPH %: 42 % (ref 10–50)
Platelet Count, POC: 171 10*3/uL (ref 142–424)
RBC: 5.1 M/uL (ref 4.04–5.48)
RDW, POC: 13.2 %
WBC: 7.9 10*3/uL (ref 4.6–10.2)

## 2014-01-21 LAB — POCT GLYCOSYLATED HEMOGLOBIN (HGB A1C): Hemoglobin A1C: 7.3

## 2014-01-21 LAB — POCT UA - MICROALBUMIN: Microalbumin Ur, POC: POSITIVE mg/L

## 2014-01-21 MED ORDER — MELOXICAM 15 MG PO TABS: ORAL_TABLET | ORAL | Status: DC

## 2014-01-21 MED ORDER — RANITIDINE HCL 150 MG PO TABS: 150.0000 mg | ORAL_TABLET | Freq: Two times a day (BID) | ORAL | Status: DC

## 2014-01-21 MED ORDER — TRAZODONE HCL 50 MG PO TABS
ORAL_TABLET | ORAL | Status: DC
Start: 2014-01-21 — End: 2014-06-17

## 2014-01-21 MED ORDER — PRAMIPEXOLE DIHYDROCHLORIDE 0.25 MG PO TABS: ORAL_TABLET | ORAL | Status: DC

## 2014-01-21 NOTE — Patient Instructions (Addendum)
Medicare Annual Wellness Visit  [facility] and the medical providers at [facility] strive to bring you the best medical care.  In doing so we not only want to address your current medical conditions and concerns but also to detect new conditions early and prevent illness, disease and health-related problems.    Medicare offers a yearly Wellness Visit which allows our clinical staff to assess your need for preventative services including immunizations, lifestyle education, counseling to decrease risk of preventable diseases and screening for fall risk and other medical concerns.    This visit is provided free of charge (no copay) for all Medicare recipients. The clinical pharmacists at [facility] have begun to conduct these Wellness Visits which will also include a thorough review of all your medications.    As you primary medical provider recommend that you make an appointment for your Annual Wellness Visit if you have not done so already this year.  You may set up this appointment before you leave today or you may call back ([phone]) and schedule an appointment.  Please make sure when you call that you mention that you are scheduling your Annual Wellness Visit with the clinical pharmacist so that the appointment may be made for the proper length of time.      Continue current medications. Continue good therapeutic lifestyle changes which include good diet and exercise. Fall precautions discussed with patient. If an FOBT was given today- please return it to our front desk. If you are over 74 years old - you may need Prevnar 108 or the adult Pneumonia vaccine.  Check blood sugars regularly Walk and exercise as much as possible Drink plenty of water Avoid NSAIDs as much as possible--- Tylenol is safer  Return the FOBT  Return to clinic for pelvic and Pap exam with Shelah Lewandowsky We will arrange to get a chest CT because of  your smoking history and family history.  continue to decrease sodium intake as much as possible Wear a Band-Aid on the trigger finger at nighttime as directed Use warm wet compresses to painful area right arm 20 minutes 3 or 4 times daily

## 2014-01-21 NOTE — Progress Notes (Signed)
Subjective:    Patient ID: Theresa Wilson, female    DOB: [birth year], 68 y.o.   MRN: [id number]  HPI Pt here for follow up and management of chronic medical problems. See the patient's active problem list. She complains today of right arm pain and anxiety. Home health maintenance issues she is due to get a mammogram and a pelvic exam. A DEXA scan will be ordered. She is to return in FOBT. She will get lab work today and a urine microalbumin. Patient has a family history of lung cancer. Her sister expired from lung cancer. The patient is a past  smoker.        Patient Active Problem List   Diagnosis Date Noted  . Need for pneumococcal vaccination 07/15/2013  . HTN (hypertension) 11/11/2012  . HLD (hyperlipidemia) 11/11/2012  . DM (diabetes mellitus) 11/11/2012  . Restless legs syndrome 11/11/2012  . GERD (gastroesophageal reflux disease) 11/11/2012   Outpatient Encounter Prescriptions as of 01/21/2014  Medication Sig  . meloxicam (MOBIC) 15 MG tablet TAKE ONE TABLET BY MOUTH ONCE EVERY DAY  . pramipexole (MIRAPEX) 0.25 MG tablet TAKE ONE(1) TABLET BY MOUTH AT BEDTIME  . ranitidine (ZANTAC) 150 MG tablet Take 1 tablet (150 mg total) by mouth 2 (two) times daily.  . simvastatin (ZOCOR) 20 MG tablet TAKE ONE TABLET BY MOUTH DAILY IN THE EVENING  . traZODone (DESYREL) 50 MG tablet TAKE ONE(1) TABLET BY MOUTH AT BEDTIME  . metFORMIN (GLUCOPHAGE) 500 MG tablet Take 250 mg by mouth.  . [DISCONTINUED] pneumococcal 13-valent conjugate vaccine (PREVNAR 13) injection 0.5 mL     Review of Systems  Constitutional: Negative.   HENT: Negative.   Eyes: Negative.   Respiratory: Negative.   Cardiovascular: Negative.   Gastrointestinal: Negative.   Endocrine: Negative.   Genitourinary: Negative.   Musculoskeletal: Myalgias: right upper arm pain - at times.  Skin: Negative.   Allergic/Immunologic: Negative.   Neurological: Negative.   Hematological: Negative.   Psychiatric/Behavioral: The  patient is nervous/anxious (loss of job).        Objective:   Physical Exam  Nursing note and vitals reviewed. Constitutional: She is oriented to person, place, and time. She appears well-developed and well-nourished. No distress.  HENT:  Head: Normocephalic and atraumatic.  Right Ear: External ear normal.  Left Ear: External ear normal.  Mouth/Throat: Oropharynx is clear and moist.  Nasal congestion  Eyes: Conjunctivae and EOM are normal. Pupils are equal, round, and reactive to light. Right eye exhibits no discharge. Left eye exhibits no discharge. No scleral icterus.  Neck: Normal range of motion. Neck supple. No thyromegaly present.  Cardiovascular: Normal rate, regular rhythm, normal heart sounds and intact distal pulses.  Exam reveals no gallop and no friction rub.   No murmur heard. At 72 per minute  Pulmonary/Chest: Effort normal and breath sounds normal. No respiratory distress. She has no wheezes. She has no rales. She exhibits no tenderness.  Abdominal: Soft. Bowel sounds are normal. She exhibits no mass. There is no tenderness. There is no rebound and no guarding.  Musculoskeletal: Normal range of motion. She exhibits no edema and no tenderness.  Tenderness distal deltoid insertion right arm Trigger finger ring finger left hand  Lymphadenopathy:    She has no cervical adenopathy.  Neurological: She is alert and oriented to person, place, and time. She has normal reflexes. No cranial nerve deficit.  Skin: Skin is warm and dry. No rash noted. No erythema.  Psychiatric: She has a normal  mood and affect. Her behavior is normal. Judgment and thought content normal.   BP 157/87  Pulse 90  Temp(Src) 98.1 F (36.7 C) (Oral)  Ht 5' 3" (1.6 m)  Wt 182 lb (82.555 kg)  BMI 32.25 kg/m2        Assessment & Plan:  1. Type 2 diabetes mellitus without complication - POCT UA - Microalbumin - POCT CBC - POCT glycosylated hemoglobin (Hb A1C) - Microalbumin, urine  2. HLD  (hyperlipidemia) - POCT CBC - NMR, lipoprofile  3. Essential hypertension - POCT CBC - BMP8+EGFR - Hepatic function panel  4. Gastroesophageal reflux disease, esophagitis presence not specified - POCT CBC  5. Vitamin D deficiency - POCT CBC - Vit D  25 hydroxy (rtn osteoporosis monitoring)  6. Screening for osteoporosis - DG Bone Density; Future  7. Trigger finger of left hand  8. Arthralgia of hands, bilateral  9. Deltoid tendinitis of right shoulder  All prescriptions will be refilled Patient Instructions                       Medicare Annual Wellness Visit  [facility] and the medical providers at [facility] strive to bring you the best medical care.  In doing so we not only want to address your current medical conditions and concerns but also to detect new conditions early and prevent illness, disease and health-related problems.    Medicare offers a yearly Wellness Visit which allows our clinical staff to assess your need for preventative services including immunizations, lifestyle education, counseling to decrease risk of preventable diseases and screening for fall risk and other medical concerns.    This visit is provided free of charge (no copay) for all Medicare recipients. The clinical pharmacists at [facility] have begun to conduct these Wellness Visits which will also include a thorough review of all your medications.    As you primary medical provider recommend that you make an appointment for your Annual Wellness Visit if you have not done so already this year.  You may set up this appointment before you leave today or you may call back [phone]) and schedule an appointment.  Please make sure when you call that you mention that you are scheduling your Annual Wellness Visit with the clinical pharmacist so that the appointment may be made for the proper length of time.      Continue current medications. Continue good  therapeutic lifestyle changes which include good diet and exercise. Fall precautions discussed with patient. If an FOBT was given today- please return it to our front desk. If you are over 37 years old - you may need Prevnar 81 or the adult Pneumonia vaccine.  Check blood sugars regularly Walk and exercise as much as possible Drink plenty of water Avoid NSAIDs as much as possible--- Tylenol is safer  Return the FOBT  Return to clinic for pelvic and Pap exam with Shelah Lewandowsky We will arrange to get a chest CT because of your smoking history and family history.  continue to decrease sodium intake as much as possible Wear a Band-Aid on the trigger finger at nighttime as directed Use warm wet compresses to painful area right arm 20 minutes 3 or 4 times daily     Arrie Senate MD

## 2014-01-22 LAB — BMP8+EGFR
BUN / CREAT RATIO: 18 (ref 11–26)
BUN: 14 mg/dL (ref 8–27)
CO2: 17 mmol/L — ABNORMAL LOW (ref 18–29)
CREATININE: 0.8 mg/dL (ref 0.57–1.00)
Calcium: 9.7 mg/dL (ref 8.7–10.3)
Chloride: 101 mmol/L (ref 97–108)
GFR calc Af Amer: 89 mL/min/{1.73_m2} (ref >59)
GFR, EST NON AFRICAN AMERICAN: 78 mL/min/{1.73_m2} (ref >59)
Glucose: 112 mg/dL — ABNORMAL HIGH (ref 65–99)
Potassium: 4.3 mmol/L (ref 3.5–5.2)
Sodium: 141 mmol/L (ref 134–144)

## 2014-01-22 LAB — NMR, LIPOPROFILE
CHOLESTEROL: 324 mg/dL — AB (ref 100–199)
HDL CHOLESTEROL BY NMR: 51 mg/dL (ref >39)
HDL Particle Number: 33.1 umol/L (ref >=30.5)
LDL PARTICLE NUMBER: 3069 nmol/L — AB (ref <1000)
LDL SIZE: 20.9 nm (ref >20.5)
LDLC SERPL CALC-MCNC: 221 mg/dL — AB (ref 0–99)
LP-IR Score: 84 — ABNORMAL HIGH (ref <=45)
Small LDL Particle Number: 1409 nmol/L — ABNORMAL HIGH (ref <=527)
TRIGLYCERIDES BY NMR: 262 mg/dL — AB (ref 0–149)

## 2014-01-22 LAB — HEPATIC FUNCTION PANEL
ALBUMIN: 4.5 g/dL (ref 3.6–4.8)
ALT: 15 IU/L (ref 0–32)
AST: 21 IU/L (ref 0–40)
Alkaline Phosphatase: 110 IU/L (ref 39–117)
Bilirubin, Direct: 0.07 mg/dL (ref 0.00–0.40)
Total Bilirubin: 0.3 mg/dL (ref 0.0–1.2)
Total Protein: 7 g/dL (ref 6.0–8.5)

## 2014-01-22 LAB — VITAMIN D 25 HYDROXY (VIT D DEFICIENCY, FRACTURES): Vit D, 25-Hydroxy: 21.8 ng/mL — ABNORMAL LOW (ref 30.0–100.0)

## 2014-01-22 LAB — MICROALBUMIN, URINE: MICROALBUM., U, RANDOM: 61.3 ug/mL — AB (ref 0.0–17.0)

## 2014-01-22 MED ORDER — VITAMIN D (ERGOCALCIFEROL) 1.25 MG (50000 UNIT) PO CAPS: 50000.0000 [IU] | ORAL_CAPSULE | ORAL | Status: DC

## 2014-01-22 MED ORDER — SIMVASTATIN 20 MG PO TABS: ORAL_TABLET | ORAL | Status: DC

## 2014-01-22 MED ORDER — LISINOPRIL 5 MG PO TABS: 5.0000 mg | ORAL_TABLET | Freq: Every day | ORAL | Status: DC

## 2014-01-22 MED ORDER — METFORMIN HCL 500 MG PO TABS: 250.0000 mg | ORAL_TABLET | Freq: Every day | ORAL | Status: DC

## 2014-01-22 NOTE — Addendum Note (Signed)
Addended by: Zannie Cove on: 01/22/2014 02:30 PM   Modules accepted: Orders

## 2014-02-06 ENCOUNTER — Encounter: Payer: Self-pay | Admitting: Pharmacist

## 2014-02-06 ENCOUNTER — Other Ambulatory Visit: Payer: Medicare PPO

## 2014-02-06 ENCOUNTER — Ambulatory Visit (INDEPENDENT_AMBULATORY_CARE_PROVIDER_SITE_OTHER): Payer: Medicare PPO | Admitting: Pharmacist

## 2014-02-06 VITALS — BP 110/78 | HR 85 | Ht 63.0 in | Wt 184.5 lb

## 2014-02-06 DIAGNOSIS — I10 Essential (primary) hypertension: Secondary | ICD-10-CM

## 2014-02-06 DIAGNOSIS — E785 Hyperlipidemia, unspecified: Secondary | ICD-10-CM

## 2014-02-06 DIAGNOSIS — Z1212 Encounter for screening for malignant neoplasm of rectum: Secondary | ICD-10-CM

## 2014-02-06 DIAGNOSIS — E119 Type 2 diabetes mellitus without complications: Secondary | ICD-10-CM

## 2014-02-06 MED ORDER — METFORMIN HCL ER 500 MG PO TB24: ORAL_TABLET | ORAL | Status: DC

## 2014-02-06 NOTE — Patient Instructions (Signed)
Diabetes and Standards of Medical Care  Diabetes is complicated. You may find that your diabetes team includes a dietitian, nurse, diabetes educator, eye doctor, and more. To help everyone know what is going on and to help you get the care you deserve, the following schedule of care was developed to help keep you on track. Below are the tests, exams, vaccines, medicines, education, and plans you will need.  Blood Glucose Goals Prior to meals = 80 - 130 Within 2 hours of the start of a meal = less than 180  HbA1c test (goal is less than 7.0% - your last value was 7.3%) This test shows how well you have controlled your glucose over the past 2 3 months. It is used to see if your diabetes management plan needs to be adjusted.   It is performed at least 2 times a year if you are meeting treatment goals.  It is performed 4 times a year if therapy has changed or if you are not meeting treatment goals.   Blood pressure test  This test is performed at every routine medical visit. The goal is less than 140/90 mmHg for most people, but 130/80 mmHg in some cases. Ask your health care provider about your goal. Dental exam  Follow up with the dentist regularly. Eye exam  If you are diagnosed with type 1 diabetes as a child, get an exam upon reaching the age of 10 years or older and have had diabetes for 3 5 years. Yearly eye exams are recommended after that initial eye exam.  If you are diagnosed with type 1 diabetes as an adult, get an exam within 5 years of diagnosis and then yearly.  If you are diagnosed with type 2 diabetes, get an exam as soon as possible after the diagnosis and then yearly. Foot care exam  Visual foot exams are performed at every routine medical visit. The exams check for cuts, injuries, or other problems with the feet.  A comprehensive foot exam should be done yearly. This includes visual inspection as well as assessing foot pulses and testing for loss of sensation.  Check  your feet nightly for cuts, injuries, or other problems with your feet. Tell your health care provider if anything is not healing. Kidney function test (urine microalbumin)  This test is performed once a year.  Type 1 diabetes: The first test is performed 5 years after diagnosis.  Type 2 diabetes: The first test is performed at the time of diagnosis.  A serum creatinine and estimated glomerular filtration rate (eGFR) test is done once a year to assess the level of chronic kidney disease (CKD), if present. Lipid profile (cholesterol, HDL, LDL, triglycerides)  Performed every 5 years for most people.  The goal for LDL is less than 100 mg/dL. If you are at high risk, the goal is less than 70 mg/dL.  The goal for HDL is 40 mg/dL 50 mg/dL for men and 50 mg/dL 60 mg/dL for women. An HDL cholesterol of 60 mg/dL or higher gives some protection against heart disease.  The goal for triglycerides is less than 150 mg/dL. Influenza vaccine, pneumococcal vaccine, and hepatitis B vaccine  The influenza vaccine is recommended yearly.  The pneumococcal vaccine is generally given once in a lifetime. However, there are some instances when another vaccination is recommended. Check with your health care provider.  The hepatitis B vaccine is also recommended for adults with diabetes. Diabetes self-management education  Education is recommended at diagnosis and ongoing   as needed. Treatment plan  Your treatment plan is reviewed at every medical visit. Document Released: 05/07/2009 Document Revised: 03/12/2013 Document Reviewed: 12/10/2012 [facility] Patient Information 2014 [place].

## 2014-02-06 NOTE — Progress Notes (Signed)
Subjective:    Theresa Wilson is a 68 y.o. female who presents for an initial evaluation of Type 2 diabetes mellitus and diabetes education. Current symptoms/problems include hyperglycemia and have been worsening. Symptoms have been present for 2 months.  The patient was initially diagnosed with Type 2 diabetes mellitus 3-4 years ago  Known diabetic complications: none Cardiovascular risk factors: advanced age (older than 68 for men, 9 for women), diabetes mellitus, dyslipidemia, family history of premature cardiovascular disease, hypertension and obesity (BMI >= 30 kg/m2) Current diabetic medications include oral agent (monotherapy): metformin (generic). 250mg  daily.  Patient states she occasionally has gas and bloating she relates to metformin  Eye exam current (within one year): unknown Weight trend: stable Prior visit with dietician: no Current diet: in general, an "unhealthy" diet - 6 months ago patient was eating better b/c she was living with her daughter whol eats very little meat and lots of vegetables. Current exercise: none  Current monitoring regimen: home blood tests - one times daily Home blood sugar records: fasting range: 140 - 170 Any episodes of hypoglycemia? no  Is She on ACE inhibitor or angiotensin II receptor blocker?  Yes  lisinopril (Prinivil) 5mg  daily - started 2 weeks ago    The following portions of the patient's history were reviewed and updated as appropriate: allergies, current medications, past family history, past medical history, past social history, past surgical history and problem list.  Review of Systems Pertinent items are noted in HPI.    Objective:    BP 110/78  Pulse 85  Ht 5\' 3"  (1.6 m)  Wt 184 lb 8 oz (83.689 kg)  BMI 32.69 kg/m2  General:  alert, cooperative, appears stated age, mild distress and mildly obese  Oropharynx: lips, mucosa, and tongue normal; teeth and gums normal   Lab Review Glucose (mg/dL)  Date Value  01/21/2014  112*  07/15/2013 81   03/13/2013 136*     Glucose, Bld (mg/dL)  Date Value  11/11/2012 90      CO2 (mmol/L)  Date Value  01/21/2014 17*  07/15/2013 24   03/13/2013 23      BUN (mg/dL)  Date Value  01/21/2014 14   07/15/2013 14   03/13/2013 16   11/11/2012 16      Creat (mg/dL)  Date Value  11/11/2012 0.87      Creatinine, Ser (mg/dL)  Date Value  01/21/2014 0.80   07/15/2013 0.93   03/13/2013 0.85    A1c was 7.3% (01/21/2014)    Assessment:    Diabetes Mellitus type II, under fair control.  Hyperlipidemia - LDL has doubled since move from daughter's home - key is better nutrition / diet microabluminuria   Plan:    1.  Rx changes: Change metformin XR 500mg  to 1 tablet daily with evening meal. 2.  Education: Reviewed 'ABCs' of diabetes management (respective goals in parentheses):  A1C (<7), blood pressure (<130/80), and cholesterol (LDL <100). 3.  Compliance at present is estimated to be good. Efforts to improve compliance (if necessary) will be directed at dietary modifications:  Increase non-starchy vegetables - carrots, green bean, squash, zucchini, tomatoes, onions, peppers, spinach and other green leafy vegetables, cabbage, lettuce, cucumbers, asparagus, okra (not fried), eggplant limit sugar and processed foods (cakes, cookies, ice cream, crackers and chips) Increase fresh fruit but limit serving sizes 1/2 cup or about the size of tennis or baseball limit red meat to now more than 1-2 times per week (serving size about the size of  your palm) Choose whole grains / leans proteins - whole wheat bread, quinoa, whole grain rice (1/2 cup), fish, chicken, [place] , increased exercise and regular blood sugar monitoring: check once daily at varing times - reviewed BG goal and also printed for patient. 4. Follow up: 1 month   Cherre Robins, PharmD, CPP, CDE

## 2014-02-06 NOTE — Progress Notes (Signed)
Pt came in for lab  only 

## 2014-02-07 LAB — BMP8+EGFR
BUN / CREAT RATIO: 15 (ref 11–26)
BUN: 13 mg/dL (ref 8–27)
CO2: 21 mmol/L (ref 18–29)
CREATININE: 0.87 mg/dL (ref 0.57–1.00)
Calcium: 9.4 mg/dL (ref 8.7–10.3)
Chloride: 97 mmol/L (ref 97–108)
GFR, EST AFRICAN AMERICAN: 81 mL/min/{1.73_m2} (ref >59)
GFR, EST NON AFRICAN AMERICAN: 70 mL/min/{1.73_m2} (ref >59)
Glucose: 114 mg/dL — ABNORMAL HIGH (ref 65–99)
Potassium: 4.2 mmol/L (ref 3.5–5.2)
Sodium: 138 mmol/L (ref 134–144)

## 2014-02-08 LAB — FECAL OCCULT BLOOD, IMMUNOCHEMICAL: Fecal Occult Bld: NEGATIVE

## 2014-02-09 ENCOUNTER — Encounter: Payer: Self-pay | Admitting: Pharmacist

## 2014-02-10 ENCOUNTER — Ambulatory Visit (HOSPITAL_COMMUNITY)
Admission: RE | Admit: 2014-02-10 | Discharge: 2014-02-10 | Disposition: A | Discharge status: Home / Self Care | Payer: Medicare PPO | Source: Ambulatory Visit | Attending: Family Medicine | Admitting: Family Medicine

## 2014-02-10 DIAGNOSIS — Z122 Encounter for screening for malignant neoplasm of respiratory organs: Secondary | ICD-10-CM | POA: Insufficient documentation

## 2014-02-10 DIAGNOSIS — Z801 Family history of malignant neoplasm of trachea, bronchus and lung: Secondary | ICD-10-CM | POA: Diagnosis not present

## 2014-02-10 DIAGNOSIS — Z87891 Personal history of nicotine dependence: Secondary | ICD-10-CM | POA: Diagnosis not present

## 2014-02-10 DIAGNOSIS — I10 Essential (primary) hypertension: Secondary | ICD-10-CM | POA: Diagnosis present

## 2014-02-10 DIAGNOSIS — E785 Hyperlipidemia, unspecified: Secondary | ICD-10-CM | POA: Diagnosis not present

## 2014-02-10 DIAGNOSIS — I728 Aneurysm of other specified arteries: Secondary | ICD-10-CM | POA: Insufficient documentation

## 2014-02-12 ENCOUNTER — Telehealth: Payer: Self-pay

## 2014-02-12 ENCOUNTER — Other Ambulatory Visit: Payer: Self-pay | Admitting: *Deleted

## 2014-02-12 DIAGNOSIS — I722 Aneurysm of renal artery: Secondary | ICD-10-CM

## 2014-02-12 NOTE — Telephone Encounter (Signed)
Message copied by Koren Bound on Thu Feb 12, 2014  9:36 AM ------      Message from: Chipper Herb      Created: Wed Feb 11, 2014  8:53 PM       As per radiology report------- please repeat this x-ray with a low dose chest CT  without contrast in 12 months----please inform radiologist that this patient's sister had a lung cancer and make sure that a 12 month followup is appropriate versus 6 months      Please confirm with radiologist what further evaluation is needed for the splenic artery aneurysm and when ------

## 2014-02-12 NOTE — Telephone Encounter (Signed)
Pt aware of CT results and need for 1) 6 month f/u and 2) Vascular Surgeon referral

## 2014-02-12 NOTE — Telephone Encounter (Signed)
[facility] to X-ray 

## 2014-03-10 ENCOUNTER — Encounter: Payer: Medicare PPO | Admitting: Vascular Surgery

## 2014-04-07 ENCOUNTER — Other Ambulatory Visit: Payer: Medicare PPO | Admitting: Nurse Practitioner

## 2014-04-13 ENCOUNTER — Encounter: Payer: Self-pay | Admitting: Vascular Surgery

## 2014-04-14 ENCOUNTER — Encounter: Payer: Self-pay | Admitting: Vascular Surgery

## 2014-04-14 ENCOUNTER — Ambulatory Visit (INDEPENDENT_AMBULATORY_CARE_PROVIDER_SITE_OTHER): Payer: Medicare PPO | Admitting: Vascular Surgery

## 2014-04-14 VITALS — BP 119/67 | HR 74 | Ht 63.0 in | Wt 188.0 lb

## 2014-04-14 DIAGNOSIS — I728 Aneurysm of other specified arteries: Secondary | ICD-10-CM

## 2014-04-14 NOTE — Progress Notes (Signed)
Patient name: DEMAYA Wilson MRN: [id number] DOB: 08/27/1945 Sex: female   Referred by: Laurance Flatten  Reason for referral:  Chief Complaint  Patient presents with  . New Evaluation    splenic artery aneurysm     HISTORY OF PRESENT ILLNESS: Patient reports today for discussion of splenic artery aneurysm seen as an incidental finding for recent CT scan of her chest. She for a pleasant 68 year old female who had recent death of her sister with lung cancer. She had a screening CT scan of her chest in the lower cuts of this to her abdomen revealed a 1.3 cm partially calcified splenic artery aneurysm. She is seeing me today for further discussion of this. She had no prior knowledge of this and her prior imaging studies of this area  Past Medical History  Diagnosis Date  . Diabetes mellitus without complication   . Hypertension   . Hyperlipidemia   . Restless leg syndrome     Past Surgical History  Procedure Laterality Date  . Ovarian cyst removal      History   Social History  . Marital Status: Divorced    Spouse Name: N/A    Number of Children: N/A  . Years of Education: N/A   Occupational History  . Not on file.   Social History Main Topics  . Smoking status: Former Smoker    Quit date: 11/11/2008  . Smokeless tobacco: Not on file  . Alcohol Use: No  . Drug Use: No  . Sexual Activity: Not on file   Other Topics Concern  . Not on file   Social History Narrative  . No narrative on file    Family History  Problem Relation Age of Onset  . Heart attack Mother   . Heart disease Mother   . Hypertension Mother   . Hypertension Father   . Heart disease Father   . Cancer Sister   . Hyperlipidemia Sister   . Hypertension Sister     Allergies as of 04/14/2014 - Review Complete 04/14/2014  Allergen Reaction Noted  . Sulfa antibiotics  11/11/2012    Current Outpatient Prescriptions on File Prior to Visit  Medication Sig Dispense Refill  . lisinopril  (PRINIVIL,ZESTRIL) 5 MG tablet Take 1 tablet (5 mg total) by mouth daily.  90 tablet  3  . meloxicam (MOBIC) 15 MG tablet TAKE ONE TABLET BY MOUTH ONCE EVERY DAY  30 tablet  4  . metFORMIN (GLUCOPHAGE XR) 500 MG 24 hr tablet Take 1 tablet daily with evening meal  30 tablet  1  . pramipexole (MIRAPEX) 0.25 MG tablet TAKE ONE(1) TABLET BY MOUTH AT BEDTIME  30 tablet  4  . ranitidine (ZANTAC) 150 MG tablet Take 1 tablet (150 mg total) by mouth 2 (two) times daily.  60 tablet  4  . simvastatin (ZOCOR) 20 MG tablet TAKE ONE TABLET BY MOUTH DAILY IN THE EVENING  30 tablet  4  . traZODone (DESYREL) 50 MG tablet TAKE ONE(1) TABLET BY MOUTH AT BEDTIME  30 tablet  4  . Vitamin D, Ergocalciferol, (DRISDOL) 50000 UNITS CAPS capsule Take 1 capsule (50,000 Units total) by mouth every 7 (seven) days.  12 capsule  1   No current facility-administered medications on file prior to visit.     REVIEW OF SYSTEMS:  Positives indicated with an "X"  CARDIOVASCULAR:  [ ]  chest pain   [ ]  chest pressure   [ ]  palpitations   [ ]  orthopnea   [ ]   dyspnea on exertion   [ ]  claudication   [ ]  rest pain   [ ]  DVT   [ ]  phlebitis PULMONARY:   [ ]  productive cough   [ ]  asthma   [ ]  wheezing NEUROLOGIC:   [ ]  weakness  [ ]  paresthesias  [ ]  aphasia  [ ]  amaurosis  [ ]  dizziness HEMATOLOGIC:   [ ]  bleeding problems   [ ]  clotting disorders MUSCULOSKELETAL:  [ ]  joint pain   [ ]  joint swelling GASTROINTESTINAL: [ ]   blood in stool  [ ]   hematemesis GENITOURINARY:  [ ]   dysuria  [ ]   hematuria PSYCHIATRIC:  [ ]  history of major depression INTEGUMENTARY:  [ ]  rashes  [ ]  ulcers CONSTITUTIONAL:  [ ]  fever   [ ]  chills  PHYSICAL EXAMINATION:  General: The patient is a well-nourished female, in no acute distress. Vital signs are BP 119/67  Pulse 74  Ht 5\' 3"  (1.6 m)  Wt 188 lb (85.276 kg)  BMI 33.31 kg/m2  SpO2 96% Pulmonary: There is a good air exchange  Abdomen: Soft and non-tender with normal pitch bowel sounds. No  abdominal bruits are present Musculoskeletal: There are no major deformities.  There is no significant extremity pain. Neurologic: No focal weakness or paresthesias are detected, Skin: There are no ulcer or rashes noted. Psychiatric: The patient has normal affect. Cardiovascular: There is a regular rate and rhythm without significant murmur appreciated. Pulse status: 2+ radial and 2+ dorsalis pedis pulses bilaterally  I did review her CT scan. This does show 1.3 cm splenic artery aneurysm with partial calcification.  Impression and Plan:  Had long discussion with the patient and her family present. Explained this is an incidental finding. Explained that this output or any significant risk for rupture currently with a small splenic artery aneurysm. I did explain that there is potential for this to grow and would potentially need treatment if it does. I did explain most likely lifelong with slow gross and can be watched with observation only. She tells me that she is going to have a screening CT scan of her chest on annual basis and if so can have continued evaluation of her splenic artery aneurysm at that time with Dr. Laurance Flatten. If the CT scan is discontinued and she would require specific abdominal CT for further evaluation of this. Recommend repeat imaging of the splenic artery aneurysm again in one to 2 years.    EARLY, TODD [facility] Office: [phone]

## 2014-04-15 NOTE — Progress Notes (Signed)
Pt has been put on recall  List at [facility]

## 2014-04-22 ENCOUNTER — Encounter: Payer: Self-pay | Admitting: Pharmacist

## 2014-04-22 ENCOUNTER — Ambulatory Visit (INDEPENDENT_AMBULATORY_CARE_PROVIDER_SITE_OTHER): Payer: Medicare PPO

## 2014-04-22 ENCOUNTER — Ambulatory Visit (INDEPENDENT_AMBULATORY_CARE_PROVIDER_SITE_OTHER): Payer: Medicare PPO | Admitting: Pharmacist

## 2014-04-22 VITALS — Ht 62.5 in | Wt 186.0 lb

## 2014-04-22 DIAGNOSIS — E119 Type 2 diabetes mellitus without complications: Secondary | ICD-10-CM

## 2014-04-22 DIAGNOSIS — Z23 Encounter for immunization: Secondary | ICD-10-CM

## 2014-04-22 DIAGNOSIS — Z13820 Encounter for screening for osteoporosis: Secondary | ICD-10-CM

## 2014-04-22 LAB — HM DEXA SCAN: HM DEXA SCAN: NORMAL

## 2014-04-22 NOTE — Progress Notes (Signed)
Patient ID: Theresa Wilson, female   DOB: 03-01-1946, 68 y.o.   MRN: [id number] Subjective:    Theresa Wilson is a 68 y.o. female who presents for follow up type 2 DM and review of DEXA results. She was last seen by clinical pharmacist 01/2014 at which time metformin XR 500mg  1 tablet daily was started.  Patient states she is tolerating metformin well. The patient was initially diagnosed with Type 2 diabetes mellitus 3-4 years ago  Known diabetic complications: none Cardiovascular risk factors: advanced age (older than 108 for men, 55 for women), diabetes mellitus, dyslipidemia, family history of premature cardiovascular disease, hypertension and obesity (BMI >= 30 kg/m2)  Eye exam current (within one year): yes Weight trend: stable Prior visit with dietician: no Current diet: patient has recently started 2nd shift job.  She is eating late at night.  Some days follows CHO counting diet and other days does not.  Current exercise: none  Current monitoring regimen: home blood tests - one times daily Home blood sugar records:  reports most readings in 130' to 140's which has improved from 140 - 180. Any episodes of hypoglycemia? no  Is She on ACE inhibitor or angiotensin II receptor blocker?  Yes  lisinopril (Prinivil) 5mg  daily   OSTEOPOROSIS:   First DEXA today Back Pain?  Yes       Kyphosis?  No Prior fracture?  No Med(s) for Osteoporosis/Osteopenia:  none Med(s) previously tried for Osteoporosis/Osteopenia:  None  PMH  Age at menopause:  68yo Hysterectomy?  No Oophorectomy?  No HRT? Yes - Former.  Type/duration: less 1 year - premarin Steroid Use?  No Thyroid med?  No History of cancer?  No History of digestive disorders (ie Crohn's)?  Yes - GERD on H2antagonist Current or previous eating disorders?  No Last Vitamin D Result:  21.8 (01/21/2014) - starting Vitamin D supplementation 01/2014 Last GFR Result:  70 (01/21/2014)   The following portions of the patient's history  were reviewed and updated as appropriate: allergies, current medications, past family history, past medical history, past social history, past surgical history and problem list.  Review of Systems Pertinent items are noted in HPI.    Objective:    Ht 5' 2.5" (1.588 m)  Wt 186 lb (84.369 kg)  BMI 33.46 kg/m2   Lab Review Glucose (mg/dL)  Date Value  02/06/2014 114*  01/21/2014 112*  07/15/2013 81      Glucose, Bld (mg/dL)  Date Value  11/11/2012 90      CO2 (mmol/L)  Date Value  02/06/2014 21   01/21/2014 17*  07/15/2013 24      BUN (mg/dL)  Date Value  02/06/2014 13   01/21/2014 14   07/15/2013 14   11/11/2012 16      Creat (mg/dL)  Date Value  11/11/2012 0.87      Creatinine, Ser (mg/dL)  Date Value  02/06/2014 0.87   01/21/2014 0.80   07/15/2013 0.93    A1c was 7.3% (01/21/2014)   Calcium Assessment Calcium Intake  # of servings/day  Calcium mg  Milk (8 oz) 0  x  300  = 0  Yogurt (4 oz) 1 x  200 = 200mg   Cheese (1 oz) 1 x  200 = 200mg   Other Calcium sources   250mg   Ca supplement 0 = 0   Estimated calcium intake per day 650mg     DEXA Results Date of Test T-Score for AP Spine L1-L4 T-Score for Total Left Hip T-Score for  Total Right Hip  04/22/2014 0.6 0.3 0.7                   Assessment:    Diabetes Mellitus type II - improved Normal BMD   Plan:    1.  Rx changes: Continue metformin XR 500mg  to 1 tablet daily with evening meal. 2.  Reviewed BMD / DEXA and discussed fracture risk 3.  recommend calcium 1200mg  daily through supplementation or diet.  4.  recommend weight bearing exercise - 30 minutes at least 4 days per week.   5.  Counseled and educated about fall risk and prevention. 6.   Influenza vaccine given in office today 7. Made appt for today for mammogram  Recheck DEXA:  2 years  Time spent counseling patient:  30 minutes   Theresa Wilson, PharmD, CPP, CDE

## 2014-04-22 NOTE — Patient Instructions (Signed)
Fall Prevention and Home Safety Falls cause injuries and can affect all age groups. It is possible to use preventive measures to significantly decrease the likelihood of falls. There are many simple measures which can make your home safer and prevent falls. OUTDOORS  Repair cracks and edges of walkways and driveways.  Remove high doorway thresholds.  Trim shrubbery on the main path into your home.  Have good outside lighting.  Clear walkways of tools, rocks, debris, and clutter.  Check that handrails are not broken and are securely fastened. Both sides of steps should have handrails.  Have leaves, snow, and ice cleared regularly.  Use sand or salt on walkways during winter months.  In the garage, clean up grease or oil spills. BATHROOM  Install night lights.  Install grab bars by the toilet and in the tub and shower.  Use non-skid mats or decals in the tub or shower.  Place a plastic non-slip stool in the shower to sit on, if needed.  Keep floors dry and clean up all water on the floor immediately.  Remove soap buildup in the tub or shower on a regular basis.  Secure bath mats with non-slip, double-sided rug tape.  Remove throw rugs and tripping hazards from the floors. BEDROOMS  Install night lights.  Make sure a bedside light is easy to reach.  Do not use oversized bedding.  Keep a telephone by your bedside.  Have a firm chair with side arms to use for getting dressed.  Remove throw rugs and tripping hazards from the floor. KITCHEN  Keep handles on pots and pans turned toward the center of the stove. Use back burners when possible.  Clean up spills quickly and allow time for drying.  Avoid walking on wet floors.  Avoid hot utensils and knives.  Position shelves so they are not too high or low.  Place commonly used objects within easy reach.  If necessary, use a sturdy step stool with a grab bar when reaching.  Keep electrical cables out of the  way.  Do not use floor polish or wax that makes floors slippery. If you must use wax, use non-skid floor wax.  Remove throw rugs and tripping hazards from the floor. STAIRWAYS  Never leave objects on stairs.  Place handrails on both sides of stairways and use them. Fix any loose handrails. Make sure handrails on both sides of the stairways are as long as the stairs.  Check carpeting to make sure it is firmly attached along stairs. Make repairs to worn or loose carpet promptly.  Avoid placing throw rugs at the top or bottom of stairways, or properly secure the rug with carpet tape to prevent slippage. Get rid of throw rugs, if possible.  Have an electrician put in a light switch at the top and bottom of the stairs. OTHER FALL PREVENTION TIPS  Wear low-heel or rubber-soled shoes that are supportive and fit well. Wear closed toe shoes.  When using a stepladder, make sure it is fully opened and both spreaders are firmly locked. Do not climb a closed stepladder.  Add color or contrast paint or tape to grab bars and handrails in your home. Place contrasting color strips on first and last steps.  Learn and use mobility aids as needed. Install an electrical emergency response system.  Turn on lights to avoid dark areas. Replace light bulbs that burn out immediately. Get light switches that glow.  Arrange furniture to create clear pathways. Keep furniture in the same place.    Firmly attach carpet with non-skid or double-sided tape.  Eliminate uneven floor surfaces.  Select a carpet pattern that does not visually hide the edge of steps.  Be aware of all pets. OTHER HOME SAFETY TIPS  Set the water temperature for 120 F (48.8 C).  Keep emergency numbers on or near the telephone.  Keep smoke detectors on every level of the home and near sleeping areas. Document Released: 06/30/2002 Document Revised: 01/09/2012 Document Reviewed: 09/29/2011 [facility] Patient Information 2015  [place], [place]. This information is not intended to replace advice given to you by your health care provider. Make sure you discuss any questions you have with your health care provider.               Exercise for Strong Bones  Exercise is important to build and maintain strong bones / bone density.  There are 2 types of exercises that are important to building and maintaining strong bones:  Weight- bearing and muscle-stregthening.  Weight-bearing Exercises  These exercises include activities that make you move against gravity while staying upright. Weight-bearing exercises can be high-impact or low-impact.  High-impact weight-bearing exercises help build bones and keep them strong. If you have broken a bone due to osteoporosis or are at risk of breaking a bone, you may need to avoid high-impact exercises. If you're not sure, you should check with your healthcare provider.  Examples of high-impact weight-bearing exercises are: Dancing  Doing high-impact aerobics  Hiking  Jogging/running  Jumping Rope  Stair climbing  Tennis  Low-impact weight-bearing exercises can also help keep bones strong and are a safe alternative if you cannot do high-impact exercises.   Examples of low-impact weight-bearing exercises are: Using elliptical training machines  Doing low-impact aerobics  Using stair-step machines  Fast walking on a treadmill or outside   Muscle-Strengthening Exercises These exercises include activities where you move your body, a weight or some other resistance against gravity. They are also known as resistance exercises and include: Lifting weights  Using elastic exercise bands  Using weight machines  Lifting your own body weight  Functional movements, such as standing and rising up on your toes  Yoga and Pilates can also improve strength, balance and flexibility. However, certain positions may not be safe for people with osteoporosis or those at increased risk of broken  bones. For example, exercises that have you bend forward may increase the chance of breaking a bone in the spine.   Non-Impact Exercises There are other types of exercises that can help prevent falls.  Non-impact exercises can help you to improve balance, posture and how well you move in everyday activities. Some of these exercises include: Balance exercises that strengthen your legs and test your balance, such as Tai Chi, can decrease your risk of falls.  Posture exercises that improve your posture and reduce rounded or "sloping" shoulders can help you decrease the chance of breaking a bone, especially in the spine.  Functional exercises that improve how well you move can help you with everyday activities and decrease your chance of falling and breaking a bone. For example, if you have trouble getting up from a chair or climbing stairs, you should do these activities as exercises.   **A physical therapist can teach you balance, posture and functional exercises. He/she can also help you learn which exercises are safe and appropriate for you.   has a physical therapy office in [place] in front of our office and referrals can be made for assessments and  treatment as needed and strength and balance training.  If you would like to have an assessment with [place] and our physical therapy team please let a nurse or provider know.

## 2014-04-28 ENCOUNTER — Telehealth: Payer: Self-pay | Admitting: Family Medicine

## 2014-05-01 ENCOUNTER — Other Ambulatory Visit: Payer: Self-pay | Admitting: *Deleted

## 2014-05-01 MED ORDER — METFORMIN HCL ER 500 MG PO TB24: ORAL_TABLET | ORAL | Status: DC

## 2014-05-04 ENCOUNTER — Telehealth: Payer: Self-pay | Admitting: Family Medicine

## 2014-05-04 NOTE — Telephone Encounter (Signed)
Faxed [facility] record per patient's request

## 2014-05-21 ENCOUNTER — Encounter: Payer: Self-pay | Admitting: Family Medicine

## 2014-05-21 ENCOUNTER — Ambulatory Visit (INDEPENDENT_AMBULATORY_CARE_PROVIDER_SITE_OTHER): Payer: Medicare PPO | Admitting: Family Medicine

## 2014-05-21 VITALS — BP 117/75 | HR 80 | Temp 97.7°F | Ht 62.5 in | Wt 189.0 lb

## 2014-05-21 DIAGNOSIS — I10 Essential (primary) hypertension: Secondary | ICD-10-CM

## 2014-05-21 DIAGNOSIS — R141 Gas pain: Secondary | ICD-10-CM

## 2014-05-21 DIAGNOSIS — K219 Gastro-esophageal reflux disease without esophagitis: Secondary | ICD-10-CM

## 2014-05-21 DIAGNOSIS — E119 Type 2 diabetes mellitus without complications: Secondary | ICD-10-CM

## 2014-05-21 DIAGNOSIS — E785 Hyperlipidemia, unspecified: Secondary | ICD-10-CM

## 2014-05-21 DIAGNOSIS — E559 Vitamin D deficiency, unspecified: Secondary | ICD-10-CM

## 2014-05-21 LAB — POCT GLYCOSYLATED HEMOGLOBIN (HGB A1C): Hemoglobin A1C: 6.7

## 2014-05-21 LAB — POCT CBC
Granulocyte percent: 59.5 %G (ref 37–80)
HEMATOCRIT: 41.6 % (ref 37.7–47.9)
Hemoglobin: 13.5 g/dL (ref 12.2–16.2)
Lymph, poc: 1.9 (ref 0.6–3.4)
MCH, POC: 29.8 pg (ref 27–31.2)
MCHC: 32.5 g/dL (ref 31.8–35.4)
MCV: 91.6 fL (ref 80–97)
MPV: 7.9 fL (ref 0–99.8)
POC Granulocyte: 3.3 (ref 2–6.9)
POC LYMPH PERCENT: 34 %L (ref 10–50)
Platelet Count, POC: 262 10*3/uL (ref 142–424)
RBC: 4.5 M/uL (ref 4.04–5.48)
RDW, POC: 14 %
WBC: 5.6 10*3/uL (ref 4.6–10.2)

## 2014-05-21 MED ORDER — METFORMIN HCL ER 500 MG PO TB24: ORAL_TABLET | ORAL | Status: DC

## 2014-05-21 NOTE — Patient Instructions (Addendum)
Medicare Annual Wellness Visit  [facility] and the medical providers at [facility] strive to bring you the best medical care.  In doing so we not only want to address your current medical conditions and concerns but also to detect new conditions early and prevent illness, disease and health-related problems.    Medicare offers a yearly Wellness Visit which allows our clinical staff to assess your need for preventative services including immunizations, lifestyle education, counseling to decrease risk of preventable diseases and screening for fall risk and other medical concerns.    This visit is provided free of charge (no copay) for all Medicare recipients. The clinical pharmacists at [facility] have begun to conduct these Wellness Visits which will also include a thorough review of all your medications.    As you primary medical provider recommend that you make an appointment for your Annual Wellness Visit if you have not done so already this year.  You may set up this appointment before you leave today or you may call back ([phone]) and schedule an appointment.  Please make sure when you call that you mention that you are scheduling your Annual Wellness Visit with the clinical pharmacist so that the appointment may be made for the proper length of time.      Continue current medications. Continue good therapeutic lifestyle changes which include good diet and exercise. Fall precautions discussed with patient. If an FOBT was given today- please return it to our front desk. If you are over 73 years old - you may need Prevnar 79 or the adult Pneumonia vaccine.  Flu Shots will be available at our office starting mid- September. Please call and schedule a FLU CLINIC APPOINTMENT.  Limit milk, cheese, and dairy products We will switch your metformin back to the XR at [facility] monitoring blood sugars closely and blood  pressures when possible This winter drink plenty of fluids

## 2014-05-21 NOTE — Progress Notes (Signed)
Subjective:    Patient ID: Theresa Wilson, female    DOB: 09/11/1945, 68 y.o.   MRN: [id number]  HPI Pt here for follow up and management of chronic medical problems. The patient has a history of smoking and a sister that died with lung cancer. She had a recent CT scan which showed a very small pulmonary nodule in the CT scan will be repeated at some point in the future and she is aware of this. The patient is concerned with increased gas problems and is concerned that this could be coming from one of her medications. She otherwise has no complaints.         Patient Active Problem List   Diagnosis Date Noted  . Splenic artery aneurysm 04/14/2014  . Arthralgia of hands, bilateral 01/21/2014  . Trigger finger of left hand 01/21/2014  . Vitamin D deficiency 01/21/2014  . Need for pneumococcal vaccination 07/15/2013  . HTN (hypertension) 11/11/2012  . HLD (hyperlipidemia) 11/11/2012  . Type 2 diabetes mellitus 11/11/2012  . Restless legs syndrome 11/11/2012  . GERD (gastroesophageal reflux disease) 11/11/2012   Outpatient Encounter Prescriptions as of 05/21/2014  Medication Sig  . lisinopril (PRINIVIL,ZESTRIL) 5 MG tablet Take 1 tablet (5 mg total) by mouth daily.  . meloxicam (MOBIC) 15 MG tablet TAKE ONE TABLET BY MOUTH ONCE EVERY DAY  . metFORMIN (GLUCOPHAGE XR) 500 MG 24 hr tablet Take 1 tablet daily with evening meal  . pramipexole (MIRAPEX) 0.25 MG tablet TAKE ONE(1) TABLET BY MOUTH AT BEDTIME  . ranitidine (ZANTAC) 150 MG tablet Take 1 tablet (150 mg total) by mouth 2 (two) times daily.  . simvastatin (ZOCOR) 20 MG tablet TAKE ONE TABLET BY MOUTH DAILY IN THE EVENING  . traZODone (DESYREL) 50 MG tablet TAKE ONE(1) TABLET BY MOUTH AT BEDTIME  . Vitamin D, Ergocalciferol, (DRISDOL) 50000 UNITS CAPS capsule Take 1 capsule (50,000 Units total) by mouth every 7 (seven) days.     Review of Systems  Constitutional: Negative.   HENT: Negative.   Eyes: Negative.   Respiratory:  Negative.   Cardiovascular: Negative.   Gastrointestinal: Negative.        Increase in gas -? From meds  Endocrine: Negative.   Genitourinary: Negative.   Musculoskeletal: Negative.   Skin: Negative.   Allergic/Immunologic: Negative.   Neurological: Negative.   Hematological: Negative.   Psychiatric/Behavioral: Negative.        Objective:   Physical Exam  Nursing note and vitals reviewed. Constitutional: She is oriented to person, place, and time. She appears well-developed and well-nourished. No distress.  HENT:  Head: Normocephalic and atraumatic.  Right Ear: External ear normal.  Left Ear: External ear normal.  Nose: Nose normal.  Mouth/Throat: Oropharynx is clear and moist.  Eyes: Conjunctivae and EOM are normal. Pupils are equal, round, and reactive to light. Right eye exhibits no discharge. Left eye exhibits no discharge. No scleral icterus.  Neck: Normal range of motion. Neck supple. No JVD present. No thyromegaly present.  No carotid bruits  Cardiovascular: Normal rate, regular rhythm, normal heart sounds and intact distal pulses.  Exam reveals no gallop and no friction rub.   No murmur heard. At 72/m  Pulmonary/Chest: Effort normal and breath sounds normal. No respiratory distress. She has no wheezes. She has no rales. She exhibits no tenderness.  The lungs are clear anteriorly and posteriorly  Abdominal: Soft. Bowel sounds are normal. She exhibits no mass. There is no tenderness. There is no rebound and no guarding.  No abdominal tenderness or organ enlargement  Musculoskeletal: Normal range of motion. She exhibits no edema and no tenderness.  Lymphadenopathy:    She has no cervical adenopathy.  Neurological: She is alert and oriented to person, place, and time. She has normal reflexes. No cranial nerve deficit.  Skin: Skin is warm and dry. No rash noted. No erythema. No pallor.  Psychiatric: She has a normal mood and affect. Her behavior is normal. Judgment and  thought content normal.   BP 117/75  Pulse 80  Temp(Src) 97.7 F (36.5 C) (Oral)  Ht 5' 2.5" (1.588 m)  Wt 189 lb (85.73 kg)  BMI 34.00 kg/m2        Assessment & Plan:  1. Gastroesophageal reflux disease, esophagitis presence not specified - POCT CBC  2. HLD (hyperlipidemia) - POCT CBC - NMR, lipoprofile  3. Essential hypertension - POCT CBC - BMP8+EGFR - Hepatic function panel  4. Type 2 diabetes mellitus without complication - POCT CBC - POCT glycosylated hemoglobin (Hb A1C)  5. Vitamin D deficiency - POCT CBC - Vit D  25 hydroxy (rtn osteoporosis monitoring)  6. Abdominal gas pain -Change metformin preparation to the extended release -Call us in 2-3 weeks if gaseous distention is not improved  Patient Instructions                       Medicare Annual Wellness Visit  [facility] and the medical providers at [facility] strive to bring you the best medical care.  In doing so we not only want to address your current medical conditions and concerns but also to detect new conditions early and prevent illness, disease and health-related problems.    Medicare offers a yearly Wellness Visit which allows our clinical staff to assess your need for preventative services including immunizations, lifestyle education, counseling to decrease risk of preventable diseases and screening for fall risk and other medical concerns.    This visit is provided free of charge (no copay) for all Medicare recipients. The clinical pharmacists at [facility] have begun to conduct these Wellness Visits which will also include a thorough review of all your medications.    As you primary medical provider recommend that you make an appointment for your Annual Wellness Visit if you have not done so already this year.  You may set up this appointment before you leave today or you may call back ([phone]) and schedule an appointment.  Please make sure when  you call that you mention that you are scheduling your Annual Wellness Visit with the clinical pharmacist so that the appointment may be made for the proper length of time.      Continue current medications. Continue good therapeutic lifestyle changes which include good diet and exercise. Fall precautions discussed with patient. If an FOBT was given today- please return it to our front desk. If you are over 60 years old - you may need Prevnar 44 or the adult Pneumonia vaccine.  Flu Shots will be available at our office starting mid- September. Please call and schedule a FLU CLINIC APPOINTMENT.  Limit milk, cheese, and dairy products We will switch your metformin back to the XR at [facility] monitoring blood sugars closely and blood pressures when possible This winter drink plenty of fluids   Arrie Senate MD

## 2014-05-22 LAB — HEPATIC FUNCTION PANEL
ALBUMIN: 4.2 g/dL (ref 3.6–4.8)
ALK PHOS: 88 IU/L (ref 39–117)
ALT: 19 IU/L (ref 0–32)
AST: 16 IU/L (ref 0–40)
Bilirubin, Direct: 0.09 mg/dL (ref 0.00–0.40)
Total Bilirubin: 0.3 mg/dL (ref 0.0–1.2)
Total Protein: 6.6 g/dL (ref 6.0–8.5)

## 2014-05-22 LAB — BMP8+EGFR
BUN/Creatinine Ratio: 15 (ref 11–26)
BUN: 13 mg/dL (ref 8–27)
CO2: 22 mmol/L (ref 18–29)
Calcium: 9.5 mg/dL (ref 8.7–10.3)
Chloride: 99 mmol/L (ref 97–108)
Creatinine, Ser: 0.85 mg/dL (ref 0.57–1.00)
GFR calc non Af Amer: 72 mL/min/{1.73_m2} (ref >59)
GFR, EST AFRICAN AMERICAN: 83 mL/min/{1.73_m2} (ref >59)
GLUCOSE: 155 mg/dL — AB (ref 65–99)
POTASSIUM: 4.6 mmol/L (ref 3.5–5.2)
Sodium: 136 mmol/L (ref 134–144)

## 2014-05-22 LAB — NMR, LIPOPROFILE
CHOLESTEROL: 191 mg/dL (ref 100–199)
HDL Cholesterol by NMR: 50 mg/dL (ref >39)
HDL Particle Number: 33.8 umol/L (ref >=30.5)
LDL PARTICLE NUMBER: 1540 nmol/L — AB (ref <1000)
LDL Size: 20.8 nm (ref >20.5)
LDL-C: 98 mg/dL (ref 0–99)
LP-IR SCORE: 89 — AB (ref <=45)
Small LDL Particle Number: 641 nmol/L — ABNORMAL HIGH (ref <=527)
Triglycerides by NMR: 213 mg/dL — ABNORMAL HIGH (ref 0–149)

## 2014-05-22 LAB — VITAMIN D 25 HYDROXY (VIT D DEFICIENCY, FRACTURES): Vit D, 25-Hydroxy: 40.6 ng/mL (ref 30.0–100.0)

## 2014-05-29 ENCOUNTER — Telehealth: Payer: Self-pay | Admitting: *Deleted

## 2014-05-29 ENCOUNTER — Encounter: Payer: Self-pay | Admitting: Nurse Practitioner

## 2014-05-29 ENCOUNTER — Ambulatory Visit (INDEPENDENT_AMBULATORY_CARE_PROVIDER_SITE_OTHER): Payer: Medicare PPO | Admitting: Nurse Practitioner

## 2014-05-29 DIAGNOSIS — Z01419 Encounter for gynecological examination (general) (routine) without abnormal findings: Secondary | ICD-10-CM

## 2014-05-29 NOTE — Telephone Encounter (Signed)
Pt came by today for appt with MMM -  she came by to let us know that the change in metformin from plain to XR - has not helped her flatulence or excessive loose stools.  She states that her last colon was 5 yrs ago and that it was WNL - (repeat in 10 yrs) She uses [facility] in [place], [place]  Do you think this med can be changed to something else- or do we need further GI workup? She has been on Metformin for years.

## 2014-05-29 NOTE — Telephone Encounter (Signed)
Please have Tammy discussed this issue with the patient

## 2014-05-29 NOTE — Patient Instructions (Signed)
Pap Test A Pap test is a procedure done in a clinic office to evaluate cells that are on the surface of the cervix. The cervix is the lower portion of the uterus and upper portion of the vagina. For some women, the cervical region has the potential to form cancer. With consistent evaluations by your caregiver, this type of cancer can be prevented.  If a Pap test is abnormal, it is most often a result of a previous exposure to human papillomavirus (HPV). HPV is a virus that can infect the cells of the cervix and cause dysplasia. Dysplasia is where the cells no longer look normal. If a woman has been diagnosed with high-grade or severe dysplasia, they are at higher risk of developing cervical cancer. People diagnosed with low-grade dysplasia should still be seen by their caregiver because there is a small chance that low-grade dysplasia could develop into cancer.  LET YOUR CAREGIVER KNOW ABOUT:  Recent sexually transmitted infection (STI) you have had.  Any new sex partners you have had.  History of previous abnormal Pap tests results.  History of previous cervical procedures you have had (colposcopy, biopsy, loop electrosurgical excision procedure [LEEP]).  Concerns you have had regarding unusual vaginal discharge.  History of pelvic pain.  Your use of birth control. BEFORE THE PROCEDURE  Ask your caregiver when to schedule your Pap test. It is best not to be on your period if your caregiver uses a wooden spatula to collect cells or applies cells to a glass slide. Newer techniques are not so sensitive to the timing of a menstrual cycle.  Do not douche or have sexual intercourse for 24 hours before the test.   Do not use vaginal creams or tampons for 24 hours before the test.   Empty your bladder just before the test to lessen any discomfort.  PROCEDURE You will lie on an exam table with your feet in stirrups. A warm metal or plastic instrument (speculum) is placed in your vagina. This  instrument allows your caregiver to see the inside of your vagina and look at your cervix. A small, plastic brush or wooden spatula is then used to collect cervical cells. These cells are placed in a lab specimen container. The cells are looked at under a microscope. A specialist will determine if the cells are normal.  AFTER THE PROCEDURE Make sure to get your test results.If your results come back abnormal, you may need further testing.  Document Released: 09/30/2002 Document Revised: 10/02/2011 Document Reviewed: 07/06/2011 Exit[employer] Patient Information 2015 [employer]. This information is not intended to replace advice given to you by your health care provider. Make sure you discuss any questions you have with your health care provider.  

## 2014-05-29 NOTE — Progress Notes (Signed)
   Subjective:    Patient ID: Theresa Wilson, female    DOB: 11-Apr-1946, 67 y.o.   MRN: [id number]  HPI Ms. Boot is a regular patient of Dr. Laurance Flatten that is here today for pelvic and breast exam only- SHe is doing well today without complaints.    Review of Systems  Constitutional: Negative.   HENT: Negative.   Respiratory: Negative.   Cardiovascular: Negative.   Genitourinary: Negative.   Neurological: Negative.   Psychiatric/Behavioral: Negative.   All other systems reviewed and are negative.      Objective:   Physical Exam  Constitutional: She is oriented to person, place, and time. She appears well-developed and well-nourished. No distress.  HENT:  Head: Normocephalic.  Right Ear: Hearing, tympanic membrane, external ear and ear canal normal.  Left Ear: Hearing, tympanic membrane, external ear and ear canal normal.  Nose: Nose normal.  Mouth/Throat: Uvula is midline and oropharynx is clear and moist.  Eyes: Conjunctivae and EOM are normal. Pupils are equal, round, and reactive to light.  Neck: Normal range of motion and full passive range of motion without pain. Neck supple. No JVD present. Carotid bruit is not present. No thyroid mass and no thyromegaly present.  Cardiovascular: Normal rate, normal heart sounds and intact distal pulses.   No murmur heard. Pulmonary/Chest: Effort normal and breath sounds normal. Right breast exhibits no inverted nipple, no mass, no nipple discharge, no skin change and no tenderness. Left breast exhibits no inverted nipple, no mass, no nipple discharge, no skin change and no tenderness.  Abdominal: Soft. Bowel sounds are normal. She exhibits no mass. There is no tenderness.  Genitourinary: Vagina normal and uterus normal. No breast swelling, tenderness, discharge or bleeding.  bimanual exam-No adnexal masses or tenderness. Cervix parous and pink no discharge  Musculoskeletal: Normal range of motion.  Lymphadenopathy:    She has no cervical  adenopathy.  Neurological: She is alert and oriented to person, place, and time.  Skin: Skin is warm and dry.  Psychiatric: She has a normal mood and affect. Her behavior is normal. Judgment and thought content normal.    BP 133/70 mmHg  Pulse 82  Temp(Src) 98.2 F (36.8 C) (Oral)  Ht 5' 2.5" (1.588 m)  Wt 189 lb 9.6 oz (86.002 kg)  BMI 34.10 kg/m2       Assessment & Plan:   1. Screening   2. Encounter for routine gynecological examination    Keep follow up appointment with Dr. Laurance Flatten Discuss Metformin with Dr. Mayra Neer, FNP

## 2014-06-01 NOTE — Telephone Encounter (Signed)
Last a1c on 05/21/2014 was 6.7%.  Recommend patient hold metformin for next 1-2 weeks.  Continue to monitor BG daily. If you see BG increase to over 120 fasting or 180 after meals contact office for addition of new diabetic medication. She currently has appt with me in early January 2016.

## 2014-06-02 LAB — PAP IG (IMAGE GUIDED): PAP SMEAR COMMENT: 0

## 2014-06-17 ENCOUNTER — Other Ambulatory Visit: Payer: Self-pay | Admitting: Family Medicine

## 2014-07-08 ENCOUNTER — Other Ambulatory Visit: Payer: Self-pay | Admitting: Family Medicine

## 2014-07-08 NOTE — Telephone Encounter (Signed)
Last seen 05/29/14 MMM   Last Vit D 05/20/14 40.6 normal

## 2014-07-14 ENCOUNTER — Ambulatory Visit (INDEPENDENT_AMBULATORY_CARE_PROVIDER_SITE_OTHER): Payer: Medicare PPO | Admitting: Family Medicine

## 2014-07-14 ENCOUNTER — Encounter: Payer: Self-pay | Admitting: Family Medicine

## 2014-07-14 VITALS — BP 162/85 | HR 89 | Temp 97.4°F | Wt 187.0 lb

## 2014-07-14 DIAGNOSIS — J0101 Acute recurrent maxillary sinusitis: Secondary | ICD-10-CM

## 2014-07-14 MED ORDER — HYDROCODONE-HOMATROPINE 5-1.5 MG/5ML PO SYRP: 5.0000 mL | ORAL_SOLUTION | Freq: Three times a day (TID) | ORAL | Status: DC | PRN

## 2014-07-14 MED ORDER — AMOXICILLIN-POT CLAVULANATE 875-125 MG PO TABS: 1.0000 | ORAL_TABLET | Freq: Two times a day (BID) | ORAL | Status: DC

## 2014-07-14 NOTE — Progress Notes (Signed)
   Subjective:    Patient ID: Theresa Wilson, female    DOB: 1945/12/02, 68 y.o.   MRN: [id number]  HPI  68 year old female complaining of productive cough and lateral-year-old pain drainage. Cough is productive of green sputum with blood-tinged.    Review of Systems  HENT: Positive for congestion and ear pain.        Objective:   Physical Exam  HENT:  Head: Normocephalic.  Right Ear: External ear normal.  Left Ear: External ear normal.  Mouth/Throat: Oropharynx is clear and moist.  Cardiovascular: Normal rate and regular rhythm.   Pulmonary/Chest: Effort normal and breath sounds normal.   BP 162/85 mmHg  Pulse 89  Temp(Src) 97.4 F (36.3 C) (Oral)  Wt 187 lb (84.823 kg)       Assessment & Plan:  1. Acute recurrent maxillary sinusitis Rx Augmentin 875 twice a day Hycodan for cough continue Mucinex in the daytime and drink lots of fluids  Wardell Honour MD

## 2014-07-29 ENCOUNTER — Other Ambulatory Visit: Payer: Self-pay | Admitting: Family Medicine

## 2014-07-29 DIAGNOSIS — R911 Solitary pulmonary nodule: Secondary | ICD-10-CM

## 2014-07-31 ENCOUNTER — Ambulatory Visit (INDEPENDENT_AMBULATORY_CARE_PROVIDER_SITE_OTHER): Payer: Medicare PPO | Admitting: Pharmacist

## 2014-07-31 ENCOUNTER — Encounter: Payer: Self-pay | Admitting: Pharmacist

## 2014-07-31 VITALS — BP 144/78 | HR 76 | Ht 63.0 in | Wt 189.0 lb

## 2014-07-31 DIAGNOSIS — Z0000 Encounter for general adult medical examination without abnormal findings: Secondary | ICD-10-CM

## 2014-07-31 DIAGNOSIS — E119 Type 2 diabetes mellitus without complications: Secondary | ICD-10-CM

## 2014-07-31 MED ORDER — ONETOUCH DELICA LANCETS 33G MISC: Status: DC

## 2014-07-31 MED ORDER — GLUCOSE BLOOD VI STRP: ORAL_STRIP | Status: DC

## 2014-07-31 MED ORDER — CANAGLIFLOZIN 100 MG PO TABS: 100.0000 mg | ORAL_TABLET | Freq: Every day | ORAL | Status: DC

## 2014-07-31 MED ORDER — SIMVASTATIN 20 MG PO TABS: ORAL_TABLET | ORAL | Status: DC

## 2014-07-31 MED ORDER — ASPIRIN 81 MG PO TBEC: 81.0000 mg | DELAYED_RELEASE_TABLET | Freq: Every day | ORAL | Status: AC

## 2014-07-31 NOTE — Patient Instructions (Signed)
Preventive Care for Adults A healthy lifestyle and preventive care can promote health and wellness. Preventive health guidelines for women include the following key practices.  A routine yearly physical is a good way to check with your health care provider about your health and preventive screening. It is a chance to share any concerns and updates on your health and to receive a thorough exam.  Visit your dentist for a routine exam and preventive care every 6 months. Brush your teeth twice a day and floss once a day. Good oral hygiene prevents tooth decay and gum disease.  The frequency of eye exams is based on your age, health, family medical history, use of contact lenses, and other factors. Follow your health care provider's recommendations for frequency of eye exams.  Eat a healthy diet. Foods like vegetables, fruits, whole grains, low-fat dairy products, and lean protein foods contain the nutrients you need without too many calories. Decrease your intake of foods high in solid fats, added sugars, and salt. Eat the right amount of calories for you.Get information about a proper diet from your health care provider, if necessary.  Regular physical exercise is one of the most important things you can do for your health. Most adults should get at least 150 minutes of moderate-intensity exercise (any activity that increases your heart rate and causes you to sweat) each week. In addition, most adults need muscle-strengthening exercises on 2 or more days a week.  Maintain a healthy weight. The body mass index (BMI) is a screening tool to identify possible weight problems. It provides an estimate of body fat based on height and weight. Your health care provider can find your BMI and can help you achieve or maintain a healthy weight.For adults 20 years and older:  A BMI below 18.5 is considered underweight.  A BMI of 18.5 to 24.9 is normal.  A BMI of 25 to 29.9 is considered overweight.  A BMI of  30 and above is considered obese.  Maintain normal blood lipids and cholesterol levels by exercising and minimizing your intake of saturated fat. Eat a balanced diet with plenty of fruit and vegetables. Blood tests for lipids and cholesterol should begin at age 76 and be repeated every 5 years. If your lipid or cholesterol levels are high, you are over 50, or you are at high risk for heart disease, you may need your cholesterol levels checked more frequently.Ongoing high lipid and cholesterol levels should be treated with medicines if diet and exercise are not working.  If you smoke, find out from your health care provider how to quit. If you do not use tobacco, do not start.  Lung cancer screening is recommended for adults aged 22-80 years who are at high risk for developing lung cancer because of a history of smoking. A yearly low-dose CT scan of the lungs is recommended for people who have at least a 30-pack-year history of smoking and are a current smoker or have quit within the past 15 years. A pack year of smoking is smoking an average of 1 pack of cigarettes a day for 1 year (for example: 1 pack a day for 30 years or 2 packs a day for 15 years). Yearly screening should continue until the smoker has stopped smoking for at least 15 years. Yearly screening should be stopped for people who develop a health problem that would prevent them from having lung cancer treatment.  If you are pregnant, do not drink alcohol. If you are breastfeeding,  be very cautious about drinking alcohol. If you are not pregnant and choose to drink alcohol, do not have more than 1 drink per day. One drink is considered to be 12 ounces (355 mL) of beer, 5 ounces (148 mL) of wine, or 1.5 ounces (44 mL) of liquor.  Avoid use of street drugs. Do not share needles with anyone. Ask for help if you need support or instructions about stopping the use of drugs.  High blood pressure causes heart disease and increases the risk of  stroke. Your blood pressure should be checked at least every 1 to 2 years. Ongoing high blood pressure should be treated with medicines if weight loss and exercise do not work.  If you are 75-52 years old, ask your health care provider if you should take aspirin to prevent strokes.  Diabetes screening involves taking a blood sample to check your fasting blood sugar level. This should be done once every 3 years, after age 15, if you are within normal weight and without risk factors for diabetes. Testing should be considered at a younger age or be carried out more frequently if you are overweight and have at least 1 risk factor for diabetes.  Breast cancer screening is essential preventive care for women. You should practice "breast self-awareness." This means understanding the normal appearance and feel of your breasts and may include breast self-examination. Any changes detected, no matter how small, should be reported to a health care provider. Women in their 58s and 30s should have a clinical breast exam (CBE) by a health care provider as part of a regular health exam every 1 to 3 years. After age 16, women should have a CBE every year. Starting at age 53, women should consider having a mammogram (breast X-ray test) every year. Women who have a family history of breast cancer should talk to their health care provider about genetic screening. Women at a high risk of breast cancer should talk to their health care providers about having an MRI and a mammogram every year.  Breast cancer gene (BRCA)-related cancer risk assessment is recommended for women who have family members with BRCA-related cancers. BRCA-related cancers include breast, ovarian, tubal, and peritoneal cancers. Having family members with these cancers may be associated with an increased risk for harmful changes (mutations) in the breast cancer genes BRCA1 and BRCA2. Results of the assessment will determine the need for genetic counseling and  BRCA1 and BRCA2 testing.  Routine pelvic exams to screen for cancer are no longer recommended for nonpregnant women who are considered low risk for cancer of the pelvic organs (ovaries, uterus, and vagina) and who do not have symptoms. Ask your health care provider if a screening pelvic exam is right for you.  If you have had past treatment for cervical cancer or a condition that could lead to cancer, you need Pap tests and screening for cancer for at least 20 years after your treatment. If Pap tests have been discontinued, your risk factors (such as having a new sexual partner) need to be reassessed to determine if screening should be resumed. Some women have medical problems that increase the chance of getting cervical cancer. In these cases, your health care provider may recommend more frequent screening and Pap tests.  The HPV test is an additional test that may be used for cervical cancer screening. The HPV test looks for the virus that can cause the cell changes on the cervix. The cells collected during the Pap test can be  tested for HPV. The HPV test could be used to screen women aged 30 years and older, and should be used in women of any age who have unclear Pap test results. After the age of 30, women should have HPV testing at the same frequency as a Pap test.  Colorectal cancer can be detected and often prevented. Most routine colorectal cancer screening begins at the age of 50 years and continues through age 75 years. However, your health care provider may recommend screening at an earlier age if you have risk factors for colon cancer. On a yearly basis, your health care provider may provide home test kits to check for hidden blood in the stool. Use of a small camera at the end of a tube, to directly examine the colon (sigmoidoscopy or colonoscopy), can detect the earliest forms of colorectal cancer. Talk to your health care provider about this at age 50, when routine screening begins. Direct  exam of the colon should be repeated every 5-10 years through age 75 years, unless early forms of pre-cancerous polyps or small growths are found.  People who are at an increased risk for hepatitis B should be screened for this virus. You are considered at high risk for hepatitis B if:  You were born in a country where hepatitis B occurs often. Talk with your health care provider about which countries are considered high risk.  Your parents were born in a high-risk country and you have not received a shot to protect against hepatitis B (hepatitis B vaccine).  You have HIV or AIDS.  You use needles to inject street drugs.  You live with, or have sex with, someone who has hepatitis B.  You get hemodialysis treatment.  You take certain medicines for conditions like cancer, organ transplantation, and autoimmune conditions.  Hepatitis C blood testing is recommended for all people born from 1945 through 1965 and any individual with known risks for hepatitis C.  Practice safe sex. Use condoms and avoid high-risk sexual practices to reduce the spread of sexually transmitted infections (STIs). STIs include gonorrhea, chlamydia, syphilis, trichomonas, herpes, HPV, and human immunodeficiency virus (HIV). Herpes, HIV, and HPV are viral illnesses that have no cure. They can result in disability, cancer, and death.  You should be screened for sexually transmitted illnesses (STIs) including gonorrhea and chlamydia if:  You are sexually active and are younger than 24 years.  You are older than 24 years and your health care provider tells you that you are at risk for this type of infection.  Your sexual activity has changed since you were last screened and you are at an increased risk for chlamydia or gonorrhea. Ask your health care provider if you are at risk.  If you are at risk of being infected with HIV, it is recommended that you take a prescription medicine daily to prevent HIV infection. This is  called preexposure prophylaxis (PrEP). You are considered at risk if:  You are a heterosexual woman, are sexually active, and are at increased risk for HIV infection.  You take drugs by injection.  You are sexually active with a partner who has HIV.  Talk with your health care provider about whether you are at high risk of being infected with HIV. If you choose to begin PrEP, you should first be tested for HIV. You should then be tested every 3 months for as long as you are taking PrEP.  Osteoporosis is a disease in which the bones lose minerals and strength   with aging. This can result in serious bone fractures or breaks. The risk of osteoporosis can be identified using a bone density scan. Women ages 65 years and over and women at risk for fractures or osteoporosis should discuss screening with their health care providers. Ask your health care provider whether you should take a calcium supplement or vitamin D to reduce the rate of osteoporosis.  Menopause can be associated with physical symptoms and risks. Hormone replacement therapy is available to decrease symptoms and risks. You should talk to your health care provider about whether hormone replacement therapy is right for you.  Use sunscreen. Apply sunscreen liberally and repeatedly throughout the day. You should seek shade when your shadow is shorter than you. Protect yourself by wearing long sleeves, pants, a wide-brimmed hat, and sunglasses year round, whenever you are outdoors.  Once a month, do a whole body skin exam, using a mirror to look at the skin on your back. Tell your health care provider of new moles, moles that have irregular borders, moles that are larger than a pencil eraser, or moles that have changed in shape or color.  Stay current with required vaccines (immunizations).  Influenza vaccine. All adults should be immunized every year.  Tetanus, diphtheria, and acellular pertussis (Td, Tdap) vaccine. Pregnant women should  receive 1 dose of Tdap vaccine during each pregnancy. The dose should be obtained regardless of the length of time since the last dose. Immunization is preferred during the 27th-36th week of gestation. An adult who has not previously received Tdap or who does not know her vaccine status should receive 1 dose of Tdap. This initial dose should be followed by tetanus and diphtheria toxoids (Td) booster doses every 10 years. Adults with an unknown or incomplete history of completing a 3-dose immunization series with Td-containing vaccines should begin or complete a primary immunization series including a Tdap dose. Adults should receive a Td booster every 10 years.  Varicella vaccine. An adult without evidence of immunity to varicella should receive 2 doses or a second dose if she has previously received 1 dose. Pregnant females who do not have evidence of immunity should receive the first dose after pregnancy. This first dose should be obtained before leaving the health care facility. The second dose should be obtained 4-8 weeks after the first dose.  Human papillomavirus (HPV) vaccine. Females aged 13-26 years who have not received the vaccine previously should obtain the 3-dose series. The vaccine is not recommended for use in pregnant females. However, pregnancy testing is not needed before receiving a dose. If a female is found to be pregnant after receiving a dose, no treatment is needed. In that case, the remaining doses should be delayed until after the pregnancy. Immunization is recommended for any person with an immunocompromised condition through the age of 26 years if she did not get any or all doses earlier. During the 3-dose series, the second dose should be obtained 4-8 weeks after the first dose. The third dose should be obtained 24 weeks after the first dose and 16 weeks after the second dose.  Zoster vaccine. One dose is recommended for adults aged 60 years or older unless certain conditions are  present.  Measles, mumps, and rubella (MMR) vaccine. Adults born before 1957 generally are considered immune to measles and mumps. Adults born in 1957 or later should have 1 or more doses of MMR vaccine unless there is a contraindication to the vaccine or there is laboratory evidence of immunity to   each of the three diseases. A routine second dose of MMR vaccine should be obtained at least 28 days after the first dose for students attending postsecondary schools, health care workers, or international travelers. People who received inactivated measles vaccine or an unknown type of measles vaccine during 1963-1967 should receive 2 doses of MMR vaccine. People who received inactivated mumps vaccine or an unknown type of mumps vaccine before 1979 and are at high risk for mumps infection should consider immunization with 2 doses of MMR vaccine. For females of childbearing age, rubella immunity should be determined. If there is no evidence of immunity, females who are not pregnant should be vaccinated. If there is no evidence of immunity, females who are pregnant should delay immunization until after pregnancy. Unvaccinated health care workers born before 1957 who lack laboratory evidence of measles, mumps, or rubella immunity or laboratory confirmation of disease should consider measles and mumps immunization with 2 doses of MMR vaccine or rubella immunization with 1 dose of MMR vaccine.  Pneumococcal 13-valent conjugate (PCV13) vaccine. When indicated, a person who is uncertain of her immunization history and has no record of immunization should receive the PCV13 vaccine. An adult aged 19 years or older who has certain medical conditions and has not been previously immunized should receive 1 dose of PCV13 vaccine. This PCV13 should be followed with a dose of pneumococcal polysaccharide (PPSV23) vaccine. The PPSV23 vaccine dose should be obtained at least 8 weeks after the dose of PCV13 vaccine. An adult aged 19  years or older who has certain medical conditions and previously received 1 or more doses of PPSV23 vaccine should receive 1 dose of PCV13. The PCV13 vaccine dose should be obtained 1 or more years after the last PPSV23 vaccine dose.  Pneumococcal polysaccharide (PPSV23) vaccine. When PCV13 is also indicated, PCV13 should be obtained first. All adults aged 65 years and older should be immunized. An adult younger than age 65 years who has certain medical conditions should be immunized. Any person who resides in a nursing home or long-term care facility should be immunized. An adult smoker should be immunized. People with an immunocompromised condition and certain other conditions should receive both PCV13 and PPSV23 vaccines. People with human immunodeficiency virus (HIV) infection should be immunized as soon as possible after diagnosis. Immunization during chemotherapy or radiation therapy should be avoided. Routine use of PPSV23 vaccine is not recommended for American Indians, [place] Natives, or people younger than 65 years unless there are medical conditions that require PPSV23 vaccine. When indicated, people who have unknown immunization and have no record of immunization should receive PPSV23 vaccine. One-time revaccination 5 years after the first dose of PPSV23 is recommended for people aged 19-64 years who have chronic kidney failure, nephrotic syndrome, asplenia, or immunocompromised conditions. People who received 1-2 doses of PPSV23 before age 65 years should receive another dose of PPSV23 vaccine at age 65 years or later if at least 5 years have passed since the previous dose. Doses of PPSV23 are not needed for people immunized with PPSV23 at or after age 65 years.  Meningococcal vaccine. Adults with asplenia or persistent complement component deficiencies should receive 2 doses of quadrivalent meningococcal conjugate (MenACWY-D) vaccine. The doses should be obtained at least 2 months apart.  Microbiologists working with certain meningococcal bacteria, military recruits, people at risk during an outbreak, and people who travel to or live in countries with a high rate of meningitis should be immunized. A first-year college student up through age   21 years who is living in a residence hall should receive a dose if she did not receive a dose on or after her 16th birthday. Adults who have certain high-risk conditions should receive one or more doses of vaccine.  Hepatitis A vaccine. Adults who wish to be protected from this disease, have certain high-risk conditions, work with hepatitis A-infected animals, work in hepatitis A research labs, or travel to or work in countries with a high rate of hepatitis A should be immunized. Adults who were previously unvaccinated and who anticipate close contact with an international adoptee during the first 60 days after arrival in the [place] from a country with a high rate of hepatitis A should be immunized.  Hepatitis B vaccine. Adults who wish to be protected from this disease, have certain high-risk conditions, may be exposed to blood or other infectious body fluids, are household contacts or sex partners of hepatitis B positive people, are clients or workers in certain care facilities, or travel to or work in countries with a high rate of hepatitis B should be immunized.  Haemophilus influenzae type b (Hib) vaccine. A previously unvaccinated person with asplenia or sickle cell disease or having a scheduled splenectomy should receive 1 dose of Hib vaccine. Regardless of previous immunization, a recipient of a hematopoietic stem cell transplant should receive a 3-dose series 6-12 months after her successful transplant. Hib vaccine is not recommended for adults with HIV infection. Preventive Services / Frequency Ages 64 to 68 years  Blood pressure check.** / Every 1 to 2 years.  Lipid and cholesterol check.** / Every 5 years beginning at age  22.  Clinical breast exam.** / Every 3 years for women in their 88s and 53s.  BRCA-related cancer risk assessment.** / For women who have family members with a BRCA-related cancer (breast, ovarian, tubal, or peritoneal cancers).  Pap test.** / Every 2 years from ages 90 through 51. Every 3 years starting at age 21 through age 56 or 3 with a history of 3 consecutive normal Pap tests.  HPV screening.** / Every 3 years from ages 24 through ages 1 to 46 with a history of 3 consecutive normal Pap tests.  Hepatitis C blood test.** / For any individual with known risks for hepatitis C.  Skin self-exam. / Monthly.  Influenza vaccine. / Every year.  Tetanus, diphtheria, and acellular pertussis (Tdap, Td) vaccine.** / Consult your health care provider. Pregnant women should receive 1 dose of Tdap vaccine during each pregnancy. 1 dose of Td every 10 years.  Varicella vaccine.** / Consult your health care provider. Pregnant females who do not have evidence of immunity should receive the first dose after pregnancy.  HPV vaccine. / 3 doses over 6 months, if 72 and younger. The vaccine is not recommended for use in pregnant females. However, pregnancy testing is not needed before receiving a dose.  Measles, mumps, rubella (MMR) vaccine.** / You need at least 1 dose of MMR if you were born in 1957 or later. You may also need a 2nd dose. For females of childbearing age, rubella immunity should be determined. If there is no evidence of immunity, females who are not pregnant should be vaccinated. If there is no evidence of immunity, females who are pregnant should delay immunization until after pregnancy.  Pneumococcal 13-valent conjugate (PCV13) vaccine.** / Consult your health care provider.  Pneumococcal polysaccharide (PPSV23) vaccine.** / 1 to 2 doses if you smoke cigarettes or if you have certain conditions.  Meningococcal vaccine.** /  1 dose if you are age 19 to 21 years and a first-year college  student living in a residence hall, or have one of several medical conditions, you need to get vaccinated against meningococcal disease. You may also need additional booster doses.  Hepatitis A vaccine.** / Consult your health care provider.  Hepatitis B vaccine.** / Consult your health care provider.  Haemophilus influenzae type b (Hib) vaccine.** / Consult your health care provider. Ages 40 to 64 years  Blood pressure check.** / Every 1 to 2 years.  Lipid and cholesterol check.** / Every 5 years beginning at age 20 years.  Lung cancer screening. / Every year if you are aged 55-80 years and have a 30-pack-year history of smoking and currently smoke or have quit within the past 15 years. Yearly screening is stopped once you have quit smoking for at least 15 years or develop a health problem that would prevent you from having lung cancer treatment.  Clinical breast exam.** / Every year after age 40 years.  BRCA-related cancer risk assessment.** / For women who have family members with a BRCA-related cancer (breast, ovarian, tubal, or peritoneal cancers).  Mammogram.** / Every year beginning at age 40 years and continuing for as long as you are in good health. Consult with your health care provider.  Pap test.** / Every 3 years starting at age 30 years through age 65 or 70 years with a history of 3 consecutive normal Pap tests.  HPV screening.** / Every 3 years from ages 30 years through ages 65 to 70 years with a history of 3 consecutive normal Pap tests.  Fecal occult blood test (FOBT) of stool. / Every year beginning at age 50 years and continuing until age 75 years. You may not need to do this test if you get a colonoscopy every 10 years.  Flexible sigmoidoscopy or colonoscopy.** / Every 5 years for a flexible sigmoidoscopy or every 10 years for a colonoscopy beginning at age 50 years and continuing until age 75 years.  Hepatitis C blood test.** / For all people born from 1945 through  1965 and any individual with known risks for hepatitis C.  Skin self-exam. / Monthly.  Influenza vaccine. / Every year.  Tetanus, diphtheria, and acellular pertussis (Tdap/Td) vaccine.** / Consult your health care provider. Pregnant women should receive 1 dose of Tdap vaccine during each pregnancy. 1 dose of Td every 10 years.  Varicella vaccine.** / Consult your health care provider. Pregnant females who do not have evidence of immunity should receive the first dose after pregnancy.  Zoster vaccine.** / 1 dose for adults aged 60 years or older.  Measles, mumps, rubella (MMR) vaccine.** / You need at least 1 dose of MMR if you were born in 1957 or later. You may also need a 2nd dose. For females of childbearing age, rubella immunity should be determined. If there is no evidence of immunity, females who are not pregnant should be vaccinated. If there is no evidence of immunity, females who are pregnant should delay immunization until after pregnancy.  Pneumococcal 13-valent conjugate (PCV13) vaccine.** / Consult your health care provider.  Pneumococcal polysaccharide (PPSV23) vaccine.** / 1 to 2 doses if you smoke cigarettes or if you have certain conditions.  Meningococcal vaccine.** / Consult your health care provider.  Hepatitis A vaccine.** / Consult your health care provider.  Hepatitis B vaccine.** / Consult your health care provider.  Haemophilus influenzae type b (Hib) vaccine.** / Consult your health care provider. Ages 65   years and over  Blood pressure check.** / Every 1 to 2 years.  Lipid and cholesterol check.** / Every 5 years beginning at age 22 years.  Lung cancer screening. / Every year if you are aged 73-80 years and have a 30-pack-year history of smoking and currently smoke or have quit within the past 15 years. Yearly screening is stopped once you have quit smoking for at least 15 years or develop a health problem that would prevent you from having lung cancer  treatment.  Clinical breast exam.** / Every year after age 4 years.  BRCA-related cancer risk assessment.** / For women who have family members with a BRCA-related cancer (breast, ovarian, tubal, or peritoneal cancers).  Mammogram.** / Every year beginning at age 40 years and continuing for as long as you are in good health. Consult with your health care provider.  Pap test.** / Every 3 years starting at age 9 years through age 34 or 91 years with 3 consecutive normal Pap tests. Testing can be stopped between 65 and 70 years with 3 consecutive normal Pap tests and no abnormal Pap or HPV tests in the past 10 years.  HPV screening.** / Every 3 years from ages 57 years through ages 64 or 45 years with a history of 3 consecutive normal Pap tests. Testing can be stopped between 65 and 70 years with 3 consecutive normal Pap tests and no abnormal Pap or HPV tests in the past 10 years.  Fecal occult blood test (FOBT) of stool. / Every year beginning at age 15 years and continuing until age 17 years. You may not need to do this test if you get a colonoscopy every 10 years.  Flexible sigmoidoscopy or colonoscopy.** / Every 5 years for a flexible sigmoidoscopy or every 10 years for a colonoscopy beginning at age 86 years and continuing until age 71 years.  Hepatitis C blood test.** / For all people born from [birth year] through 1963 and any individual with known risks for hepatitis C.  Osteoporosis screening.** / A one-time screening for women ages 83 years and over and women at risk for fractures or osteoporosis.  Skin self-exam. / Monthly.  Influenza vaccine. / Every year.  Tetanus, diphtheria, and acellular pertussis (Tdap/Td) vaccine.** / 1 dose of Td every 10 years.  Varicella vaccine.** / Consult your health care provider.  Zoster vaccine.** / 1 dose for adults aged 61 years or older.  Pneumococcal 13-valent conjugate (PCV13) vaccine.** / Consult your health care provider.  Pneumococcal  polysaccharide (PPSV23) vaccine.** / 1 dose for all adults aged 28 years and older.  Meningococcal vaccine.** / Consult your health care provider.  Hepatitis A vaccine.** / Consult your health care provider.  Hepatitis B vaccine.** / Consult your health care provider.  Haemophilus influenzae type b (Hib) vaccine.** / Consult your health care provider. ** Family history and personal history of risk and conditions may change your health care provider's recommendations. Document Released: 09/05/2001 Document Revised: 11/24/2013 Document Reviewed: 12/05/2010 [facility] Patient Information 2015 [place], [place]. This information is not intended to replace advice given to you by your health care provider. Make sure you discuss any questions you have with your health care provider.

## 2014-07-31 NOTE — Progress Notes (Signed)
Patient ID: Theresa Wilson, female   DOB: [birth year], 69 y.o.   MRN: [id number] Subjective:    Theresa Wilson is a 69 y.o. female who presents for Medicare Initial Wellness Visit and recheck diabetes.   I last saw patient about 2 months ago when metformin was started.  She states that she took the metformin for about 1 week but stopped due to diarrhea.  She feels that she is doing better without metformin but BG is still not at goal.  She would like to try something different.  She is eating better - more whole grains and vegetables. Fewer sweets and CHOs.   Also patient states she has not had an Rx for simvastatin for the last 3 weeks because Rx ran out.   Preventive Screening-Counseling & Management  Tobacco History  Smoking status  . Former Smoker  . Quit date: 11/11/2008  Smokeless tobacco  . Never Used     Current Problems (verified) Patient Active Problem List   Diagnosis Date Noted  . Splenic artery aneurysm 04/14/2014  . Arthralgia of hands, bilateral 01/21/2014  . Trigger finger of left hand 01/21/2014  . Vitamin D deficiency 01/21/2014  . HTN (hypertension) 11/11/2012  . HLD (hyperlipidemia) 11/11/2012  . Type 2 diabetes mellitus 11/11/2012  . Restless legs syndrome 11/11/2012  . GERD (gastroesophageal reflux disease) 11/11/2012    Medications Prior to Visit Current Outpatient Prescriptions on File Prior to Visit  Medication Sig Dispense Refill  . lisinopril (PRINIVIL,ZESTRIL) 5 MG tablet Take 1 tablet (5 mg total) by mouth daily. 90 tablet 3  . meloxicam (MOBIC) 15 MG tablet TAKE ONE(1) TABLET BY MOUTH DAILY 30 tablet 1  . pramipexole (MIRAPEX) 0.25 MG tablet TAKE ONE(1) TABLET BY MOUTH AT BEDTIME 30 tablet 3  . ranitidine (ZANTAC) 150 MG tablet TAKE ONE TABLET BY MOUTH TWICE DAILY 60 tablet 4  . traZODone (DESYREL) 50 MG tablet TAKE ONE(1) TABLET BY MOUTH AT BEDTIME 30 tablet 1  . Vitamin D, Ergocalciferol, (DRISDOL) 50000 UNITS CAPS capsule TAKE 1 CAPSULE (50,000  UNITS  TOTAL) BY MOUTH EVERY 7 (SEVEN) DAYS. 12 capsule 0  . Blood Glucose Calibration (EMBRACE CONTROL) LOW SOLN      No current facility-administered medications on file prior to visit.    Current Medications (verified) Current Outpatient Prescriptions  Medication Sig Dispense Refill  . Garcinia Cambogia-Chromium 500-200 MG-MCG TABS Take 1 tablet by mouth daily.    Marland Kitchen lisinopril (PRINIVIL,ZESTRIL) 5 MG tablet Take 1 tablet (5 mg total) by mouth daily. 90 tablet 3  . meloxicam (MOBIC) 15 MG tablet TAKE ONE(1) TABLET BY MOUTH DAILY 30 tablet 1  . pramipexole (MIRAPEX) 0.25 MG tablet TAKE ONE(1) TABLET BY MOUTH AT BEDTIME 30 tablet 3  . ranitidine (ZANTAC) 150 MG tablet TAKE ONE TABLET BY MOUTH TWICE DAILY 60 tablet 4  . traZODone (DESYREL) 50 MG tablet TAKE ONE(1) TABLET BY MOUTH AT BEDTIME 30 tablet 1  . Vitamin D, Ergocalciferol, (DRISDOL) 50000 UNITS CAPS capsule TAKE 1 CAPSULE (50,000 UNITS  TOTAL) BY MOUTH EVERY 7 (SEVEN) DAYS. 12 capsule 0  . aspirin 81 MG EC tablet Take 1 tablet (81 mg total) by mouth daily. Swallow whole. 30 tablet 12  . Blood Glucose Calibration (EMBRACE CONTROL) LOW SOLN     . canagliflozin (INVOKANA) 100 MG TABS tablet Take 1 tablet (100 mg total) by mouth daily. 30 tablet 0  . glucose blood (ONETOUCH VERIO) test strip Use to check BG qd to bid.  Dx:  E11.9 type 2 diabetes 200 each 2  . ONETOUCH DELICA LANCETS 69G MISC Use to check BG qd to bid.  Dx: E11.9 type 2 diabetes 200 each 2  . simvastatin (ZOCOR) 20 MG tablet TAKE ONE TABLET BY MOUTH DAILY IN THE EVENING 90 tablet 1   No current facility-administered medications for this visit.     Allergies (verified) Hydrocodone-homatropine and Sulfa antibiotics   PAST HISTORY  Family History Family History  Problem Relation Age of Onset  . Heart attack Mother 18  . Heart disease Mother   . Hypertension Mother   . Hypertension Father   . Heart disease Father   . Cancer Sister   . Hyperlipidemia Sister   .  Hypertension Sister   . Congestive Heart Failure Father     Social History History  Substance Use Topics  . Smoking status: Former Smoker    Quit date: 11/11/2008  . Smokeless tobacco: Never Used  . Alcohol Use: No     Comment: occassional     Are there smokers in your home (other than you)? No  Risk Factors Current exercise habits: The patient has a physically strenuous job, but has no regular exercise apart from work.   Dietary issues discussed: trying to limit CHO intake - has bee doing a better job recently.  Eating more salads.  Eating breakfast more regularly.   Cardiac risk factors: advanced age (older than 64 for men, 63 for women), diabetes mellitus, dyslipidemia, family history of premature cardiovascular disease, hypertension, obesity (BMI >= 30 kg/m2) and smoking/ tobacco exposure.  Depression Screen (Note: if answer to either of the following is "Yes", a more complete depression screening is indicated)   Over the past 2 weeks, have you felt down, depressed or hopeless? Yes  Over the past 2 weeks, have you felt little interest or pleasure in doing things? No  Have you lost interest or pleasure in daily life? No  Do you often feel hopeless? No  Do you cry easily over simple problems? No  Activities of Daily Living In your present state of health, do you have any difficulty performing the following activities?:  Driving? No Managing money?  No Feeding yourself? No Getting from bed to chair? No  Climbing a flight of stairs? No Preparing food and eating?: No Bathing or showering? No Getting dressed: No Getting to the toilet? No Using the toilet:No Moving around from place to place: No In the past year have you fallen or had a near fall?:No   Are you sexually active?  No  Do you have more than one partner?  No  Hearing Difficulties: Yes - hearing soft voices. Hearing last checked about 20 years ago. Do you often ask people to speak up or repeat themselves?  No Do you experience ringing or noises in your ears? No Do you have difficulty understanding soft or whispered voices? Yes   Do you feel that you have a problem with memory? No  Do you often misplace items? Yes  Do you feel safe at home?  Yes  Cognitive Testing  Alert? Yes  Normal Appearance?Yes  Oriented to person? Yes  Place? Yes   Time? Yes  Recall of three objects?  Yes  Can perform simple calculations? Yes  Displays appropriate judgment?Yes  Can read the correct time from a watch face?Yes   Advanced Directives have been discussed with the patient? Yes  List the Names of Other Physician/Practitioners you currently use: 1.  Vascular surgeon - Dr  Todd Early 2. Optometrist - [place]   Indicate any recent Medical Services you may have received from other than [facility] providers in the past year (date may be approximate).  Immunization History  Administered Date(s) Administered  . Influenza,inj,Quad PF,36+ Mos 04/22/2014  . Influenza-Unspecified 04/23/2013  . Pneumococcal Conjugate-13 07/15/2013    Screening Tests Health Maintenance  Topic Date Due  . PNEUMOCOCCAL POLYSACCHARIDE VACCINE AGE 7 AND OVER  08/21/2014 (Originally 05/23/2013)  . OPHTHALMOLOGY EXAM  10/29/2014  . HEMOGLOBIN A1C  11/20/2014  . URINE MICROALBUMIN  01/22/2015  . INFLUENZA VACCINE  02/22/2015  . FOOT EXAM  05/22/2015  . MAMMOGRAM  04/22/2016  . PAP SMEAR  05/29/2016  . TETANUS/TDAP  07/24/2017  . COLONOSCOPY  07/24/2018  . DEXA SCAN  Completed  . ZOSTAVAX  Completed  hemocult was negative this year Glaucoma Screening by tonometry / applanation (10/28/2013):  Rt = 66mmHg and Lt= 47mmHg Negative retinopathy  All answers were reviewed with the patient and necessary referrals were made:  Cherre Robins, [facility]   07/31/2014   History reviewed: allergies, current medications, past family history, past medical history, past social history, past surgical history and problem list   Objective:    Body mass index is 33.49 kg/(m^2). BP 144/78 mmHg  Pulse 76  Ht 5\' 3"  (1.6 m)  Wt 189 lb (85.73 kg)  BMI 33.49 kg/m2  Assessment:     Initial Annual Wellness Visit Type 2 Diabetes     Plan:     During the course of the visit the patient was educated and counseled about appropriate screening and preventive services including:    Pneumococcal vaccine - needs pneumonia 23 - patient wants to wait until appt with PCP next month  Influenza vaccine - UTD  Td vaccine - UTD  Screening mammography - UTD  Screening Pap smear and pelvic exam - UTD  Bone densitometry screening - UTD  Colorectal cancer screening - UTD  Glaucoma screening - UTd  Nutrition counseling - Discussed at this appt - patient is doing well  Advanced directives: no adv directive - information given  Start Invokana 100mg  1 tablet daily  Restart simvastatin - rx sent to [facility].   Start Asa 81mg  1 tablet daily  Patient has flight of steps to climb at her apartment.  She mentioned that she ties to "make every load count"  - discussed getting small rolling cart or bag that she can use so that she would not get overloaded and can still hold on to hand rails.    Patient Instructions (the written plan) was given to the patient.  Medicare Attestation I have personally reviewed: The patient's medical and social history Their use of alcohol, tobacco or illicit drugs Their current medications and supplements The patient's functional ability including ADLs,fall risks, home safety risks, cognitive, and hearing and visual impairment Diet and physical activities Evidence for depression or mood disorders  The patient's weight, height, BMI, and BP/HR have been recorded in the chart.  I have made referrals, counseling, and provided education to the patient based on review of the above and I have provided the patient with a written personalized care plan for preventive services.     Cherre Robins,  [facility]   07/31/2014

## 2014-08-19 ENCOUNTER — Ambulatory Visit (HOSPITAL_COMMUNITY): Payer: Medicare PPO

## 2014-09-08 ENCOUNTER — Other Ambulatory Visit: Payer: Self-pay | Admitting: Family Medicine

## 2014-09-09 NOTE — Telephone Encounter (Signed)
Please make sure that patient is up-to-date on her appointments All prescriptions are okay to refill Please remind the patient that taking meloxicam is not good for the kidneys especially when she has diabetes. She should take as little of the meloxicam as possible

## 2014-09-12 ENCOUNTER — Ambulatory Visit (INDEPENDENT_AMBULATORY_CARE_PROVIDER_SITE_OTHER): Payer: Medicare PPO | Admitting: Family Medicine

## 2014-09-12 ENCOUNTER — Encounter: Payer: Self-pay | Admitting: Family Medicine

## 2014-09-12 VITALS — BP 133/83 | HR 98 | Temp 98.4°F | Ht 63.0 in | Wt 183.2 lb

## 2014-09-12 DIAGNOSIS — K5732 Diverticulitis of large intestine without perforation or abscess without bleeding: Secondary | ICD-10-CM

## 2014-09-12 MED ORDER — CIPROFLOXACIN HCL 500 MG PO TABS
500.0000 mg | ORAL_TABLET | Freq: Two times a day (BID) | ORAL | Status: DC
Start: 2014-09-12 — End: 2015-01-27

## 2014-09-12 MED ORDER — METRONIDAZOLE 500 MG PO TABS: 500.0000 mg | ORAL_TABLET | Freq: Three times a day (TID) | ORAL | Status: DC

## 2014-09-12 NOTE — Progress Notes (Addendum)
Subjective:  Patient ID: Theresa Wilson, female    DOB: January 12, 1946  Age: 69 y.o. MRN: [id number]  CC: Diarrhea and Blood In Stools   HPI Theresa Wilson presents for patient presents for one day actually 2 days starting yesterday morning of blood in the stool. Actually she is passing blood without even stooling. Every time she goes to the bathroom to urinate she'll pass some gas and there will be a couple of tablespoons of blood. This started out yesterday morning when she woke nauseous and feeling faint and dizzy dizzy reasonable referred for that sensation are SYMPTOM past but after 2 hours of diarrhea she was left with recurrent blood from the rectum as she urinated. She is feeling malaise, wiped out  currently. And the bleeding continues to occur about once an hour.  History Theresa Wilson has a past medical history of Diabetes mellitus without complication; Hypertension; Hyperlipidemia; Restless leg syndrome; and Cataract.   She has past surgical history that includes Ovarian cyst removal.   Her family history includes Cancer in her sister; Congestive Heart Failure in her father; Heart attack (age of onset: 67) in her mother; Heart disease in her father and mother; Hyperlipidemia in her sister; Hypertension in her father, mother, and sister.She reports that she quit smoking about 5 years ago. She has never used smokeless tobacco. She reports that she does not drink alcohol or use illicit drugs.  Current Outpatient Prescriptions on File Prior to Visit  Medication Sig Dispense Refill  . aspirin 81 MG EC tablet Take 1 tablet (81 mg total) by mouth daily. Swallow whole. 30 tablet 12  . glucose blood (ONETOUCH VERIO) test strip Use to check BG qd to bid.  Dx:  E11.9 type 2 diabetes 200 each 2  . INVOKANA 100 MG TABS tablet TAKE ONE TABLET BY MOUTH ONCE EVERY DAY 30 tablet 2  . lisinopril (PRINIVIL,ZESTRIL) 5 MG tablet Take 1 tablet (5 mg total) by mouth daily. 90 tablet 3  . meloxicam (MOBIC) 15 MG  tablet TAKE ONE TABLET BY MOUTH ONCE EVERY DAY 30 tablet 0  . ONETOUCH DELICA LANCETS 74Q MISC Use to check BG qd to bid.  Dx: E11.9 type 2 diabetes 200 each 2  . pramipexole (MIRAPEX) 0.25 MG tablet TAKE ONE(1) TABLET BY MOUTH AT BEDTIME 30 tablet 3  . ranitidine (ZANTAC) 150 MG tablet TAKE ONE TABLET BY MOUTH TWICE DAILY 60 tablet 4  . simvastatin (ZOCOR) 20 MG tablet TAKE ONE TABLET BY MOUTH DAILY IN THE EVENING 90 tablet 1  . traZODone (DESYREL) 50 MG tablet TAKE ONE(1) TABLET BY MOUTH AT BEDTIME 30 tablet 0  . Vitamin D, Ergocalciferol, (DRISDOL) 50000 UNITS CAPS capsule TAKE 1 CAPSULE (50,000 UNITS  TOTAL) BY MOUTH EVERY 7 (SEVEN) DAYS. 12 capsule 0  . Blood Glucose Calibration (EMBRACE CONTROL) LOW SOLN      No current facility-administered medications on file prior to visit.    ROS Review of Systems  Constitutional: Negative for fever, chills, diaphoresis, appetite change, fatigue and unexpected weight change.  HENT: Negative for congestion, ear pain, hearing loss, postnasal drip, rhinorrhea, sneezing, sore throat and trouble swallowing.   Eyes: Negative for pain.  Respiratory: Negative for cough, chest tightness and shortness of breath.   Cardiovascular: Negative for chest pain and palpitations.  Gastrointestinal: Positive for nausea, abdominal pain (lower abd bilateral), diarrhea (lasted 2 hours yesterday morning), constipation (mild, a dew days ago. Stools are hard) and anal bleeding. Negative for vomiting.  Genitourinary: Negative  for dysuria, frequency, vaginal bleeding and menstrual problem.  Musculoskeletal: Negative for joint swelling and arthralgias.  Skin: Negative for rash.  Neurological: Negative for dizziness, weakness, numbness and headaches.  Psychiatric/Behavioral: Negative for dysphoric mood and agitation.    Objective:  BP 133/83 mmHg  Pulse 98  Temp(Src) 98.4 F (36.9 C) (Oral)  Ht 5\' 3"  (1.6 m)  Wt 183 lb 3.2 oz (83.099 kg)  BMI 32.46 kg/m2  BP Readings  from Last 3 Encounters:  09/12/14 133/83  07/31/14 144/78  07/14/14 162/85    Wt Readings from Last 3 Encounters:  09/12/14 183 lb 3.2 oz (83.099 kg)  07/31/14 189 lb (85.73 kg)  07/14/14 187 lb (84.823 kg)     Physical Exam  Constitutional: She is oriented to person, place, and time. She appears well-developed and well-nourished. No distress.  HENT:  Head: Normocephalic and atraumatic.  Right Ear: External ear normal.  Left Ear: External ear normal.  Nose: Nose normal.  Mouth/Throat: Oropharynx is clear and moist.  Eyes: Conjunctivae and EOM are normal. Pupils are equal, round, and reactive to light.  Neck: Normal range of motion. Neck supple. No thyromegaly present.  Cardiovascular: Normal rate, regular rhythm and normal heart sounds.   No murmur heard. Pulmonary/Chest: Effort normal and breath sounds normal. No respiratory distress. She has no wheezes. She has no rales.  Abdominal: Soft. Bowel sounds are normal. She exhibits no distension. There is no tenderness.  Lymphadenopathy:    She has no cervical adenopathy.  Neurological: She is alert and oriented to person, place, and time. She has normal reflexes.  Skin: Skin is warm and dry.  Psychiatric: She has a normal mood and affect. Her behavior is normal. Judgment and thought content normal.    Lab Results  Component Value Date   HGBA1C 6.7% 05/21/2014   HGBA1C 7.3% 01/21/2014   HGBA1C 5.7 07/15/2013    Lab Results  Component Value Date   WBC 5.6 05/21/2014   HGB 13.5 05/21/2014   HCT 41.6 05/21/2014   GLUCOSE 155* 05/21/2014   CHOL 191 05/21/2014   TRIG 213* 05/21/2014   HDL 50 05/21/2014   LDLCALC 221* 01/21/2014   ALT 19 05/21/2014   AST 16 05/21/2014   NA 136 05/21/2014   K 4.6 05/21/2014   CL 99 05/21/2014   CREATININE 0.85 05/21/2014   BUN 13 05/21/2014   CO2 22 05/21/2014   HGBA1C 6.7% 05/21/2014    Ct Chest Wo/cm Screening  02/11/2014   CLINICAL DATA:  Former smoker, lung cancer screening.   EXAM: CT CHEST SCREENING WITHOUT CONTRAST  TECHNIQUE: Multidetector CT imaging of the chest was performed following the standard low-dose protocol without IV contrast.  COMPARISON:  None.  FINDINGS: No pathologically enlarged mediastinal or axillary lymph nodes. Hilar regions are difficult to definitively evaluate without IV contrast but appear grossly unremarkable. Heart size normal. No pericardial effusion.  2 mm apical left upper lobe nodule (series 6, image 29). Calcified granuloma in the anterior right lung. No pleural fluid. Airway is unremarkable.  Incidental imaging of the upper abdomen shows the visualized portions of the liver, gallbladder, adrenal glands and kidneys are grossly unremarkable. Peripherally calcified splenic artery aneurysm measures 8 x 13 mm. Probable small adjacent splenule (series 2, image 54). Visualized portions of the spleen, pancreas, stomach and bowel are otherwise grossly unremarkable. No upper abdominal adenopathy. No worrisome lytic or sclerotic lesions.  IMPRESSION: 1. Lung-RADS Category 2, benign appearance or behavior. Continue annual screening with low-dose chest CT  without contrast in 12 months. 2. Splenic artery aneurysm.   Electronically Signed   By: Lorin Picket M.D.   On: 02/11/2014 10:31    Assessment & Plan:   Nickie was seen today for diarrhea and blood in stools.  Diagnoses and all orders for this visit:  Diverticulitis of colon  Other orders -     metroNIDAZOLE (FLAGYL) 500 MG tablet; Take 1 tablet (500 mg total) by mouth 3 (three) times daily. For infection. Take the entire course -     ciprofloxacin (CIPRO) 500 MG tablet; Take 1 tablet (500 mg total) by mouth 2 (two) times daily.  I have discontinued Ms. Kazmi's Garcinia Cambogia-Chromium. I am also having her start on metroNIDAZOLE and ciprofloxacin. Additionally, I am having her maintain her lisinopril, EMBRACE CONTROL, ranitidine, pramipexole, Vitamin D (Ergocalciferol), ONETOUCH DELICA LANCETS  61W, aspirin, simvastatin, glucose blood, meloxicam, traZODone, INVOKANA, and acetaminophen.  Meds ordered this encounter  Medications  . acetaminophen (TYLENOL) 500 MG tablet    Sig: Take 500 mg by mouth every 6 (six) hours as needed.  . metroNIDAZOLE (FLAGYL) 500 MG tablet    Sig: Take 1 tablet (500 mg total) by mouth 3 (three) times daily. For infection. Take the entire course    Dispense:  30 tablet    Refill:  0  . ciprofloxacin (CIPRO) 500 MG tablet    Sig: Take 1 tablet (500 mg total) by mouth 2 (two) times daily.    Dispense:  20 tablet    Refill:  0    Follow-up: Return in about 3 days (around 09/15/2014), or if symptoms worsen or fail to improve.  Claretta Fraise, M.D.

## 2014-09-15 ENCOUNTER — Telehealth: Payer: Self-pay | Admitting: Family Medicine

## 2014-09-15 NOTE — Telephone Encounter (Signed)
Pt needed work note extended [employer] per Dr Livia Snellen Letter entered in [place]

## 2014-09-15 NOTE — Telephone Encounter (Signed)
Okay WS 

## 2014-09-15 NOTE — Telephone Encounter (Signed)
Patient was seen on Saturday 09/12/2014 and needs work note extended through today. Is this ok to write?

## 2014-09-16 NOTE — Telephone Encounter (Signed)
Pt called in needing note faxed to employer at [phone] Note faxed

## 2014-09-21 ENCOUNTER — Encounter: Payer: Self-pay | Admitting: Family Medicine

## 2014-09-21 ENCOUNTER — Ambulatory Visit (INDEPENDENT_AMBULATORY_CARE_PROVIDER_SITE_OTHER): Payer: Medicare PPO | Admitting: Family Medicine

## 2014-09-21 VITALS — BP 134/84 | HR 93 | Temp 97.5°F | Ht 63.0 in | Wt 181.0 lb

## 2014-09-21 DIAGNOSIS — K5732 Diverticulitis of large intestine without perforation or abscess without bleeding: Secondary | ICD-10-CM | POA: Diagnosis not present

## 2014-09-21 DIAGNOSIS — E119 Type 2 diabetes mellitus without complications: Secondary | ICD-10-CM

## 2014-09-21 DIAGNOSIS — N898 Other specified noninflammatory disorders of vagina: Secondary | ICD-10-CM

## 2014-09-21 DIAGNOSIS — Z13820 Encounter for screening for osteoporosis: Secondary | ICD-10-CM | POA: Diagnosis not present

## 2014-09-21 DIAGNOSIS — Z801 Family history of malignant neoplasm of trachea, bronchus and lung: Secondary | ICD-10-CM | POA: Diagnosis not present

## 2014-09-21 DIAGNOSIS — E785 Hyperlipidemia, unspecified: Secondary | ICD-10-CM | POA: Diagnosis not present

## 2014-09-21 DIAGNOSIS — I10 Essential (primary) hypertension: Secondary | ICD-10-CM | POA: Diagnosis not present

## 2014-09-21 DIAGNOSIS — K219 Gastro-esophageal reflux disease without esophagitis: Secondary | ICD-10-CM | POA: Diagnosis not present

## 2014-09-21 DIAGNOSIS — E559 Vitamin D deficiency, unspecified: Secondary | ICD-10-CM | POA: Diagnosis not present

## 2014-09-21 LAB — POCT GLYCOSYLATED HEMOGLOBIN (HGB A1C): HEMOGLOBIN A1C: 7.4

## 2014-09-21 LAB — POCT CBC
Granulocyte percent: 71.7 %G (ref 37–80)
HCT, POC: 45.9 % (ref 37.7–47.9)
Hemoglobin: 14.5 g/dL (ref 12.2–16.2)
LYMPH, POC: 1.6 (ref 0.6–3.4)
MCH: 29.2 pg (ref 27–31.2)
MCHC: 31.7 g/dL — AB (ref 31.8–35.4)
MCV: 92.1 fL (ref 80–97)
MPV: 6.7 fL (ref 0–99.8)
POC Granulocyte: 5.2 (ref 2–6.9)
POC LYMPH PERCENT: 21.7 %L (ref 10–50)
Platelet Count, POC: 326 10*3/uL (ref 142–424)
RBC: 4.98 M/uL (ref 4.04–5.48)
RDW, POC: 13.9 %
WBC: 7.2 10*3/uL (ref 4.6–10.2)

## 2014-09-21 LAB — POCT WET PREP (WET MOUNT)
KOH WET PREP POC: POSITIVE
Trichomonas Wet Prep HPF POC: NEGATIVE

## 2014-09-21 MED ORDER — MELOXICAM 15 MG PO TABS: 15.0000 mg | ORAL_TABLET | Freq: Every day | ORAL | Status: DC

## 2014-09-21 MED ORDER — LISINOPRIL 5 MG PO TABS: 5.0000 mg | ORAL_TABLET | Freq: Every day | ORAL | Status: DC

## 2014-09-21 MED ORDER — PRAMIPEXOLE DIHYDROCHLORIDE 0.25 MG PO TABS: ORAL_TABLET | ORAL | Status: DC

## 2014-09-21 MED ORDER — CANAGLIFLOZIN 100 MG PO TABS: 100.0000 mg | ORAL_TABLET | Freq: Every day | ORAL | Status: DC

## 2014-09-21 MED ORDER — RANITIDINE HCL 150 MG PO TABS: 150.0000 mg | ORAL_TABLET | Freq: Two times a day (BID) | ORAL | Status: DC

## 2014-09-21 MED ORDER — TRAZODONE HCL 50 MG PO TABS: ORAL_TABLET | ORAL | Status: DC

## 2014-09-21 MED ORDER — SIMVASTATIN 20 MG PO TABS: ORAL_TABLET | ORAL | Status: DC

## 2014-09-21 MED ORDER — VITAMIN D (ERGOCALCIFEROL) 1.25 MG (50000 UNIT) PO CAPS: ORAL_CAPSULE | ORAL | Status: DC

## 2014-09-21 NOTE — Progress Notes (Signed)
Subjective:    Patient ID: Theresa Wilson, female    DOB: [birth year], 69 y.o.   MRN: [id number]  HPI Pt here for follow up and management of chronic medical problems which includes diabetes and hyperlipidemia. She is taking medications regularly. The patient is concerned that she may have a yeast infection because of some vaginal discharge. She needs all her [facility] prescriptions printed today. Since the last visit to this office she has been seen by a provider and treated for diverticulitis. She was unable to take the metronidazole and she is finishing the ciprofloxacin now. She is doing better with her diverticulitis. This all flared up and she was eating nuts and popcorn. She denies chest pain or shortness of breath but does complain of what she thinks may be used infection due to discharge because of taking the invokanna.         Patient Active Problem List   Diagnosis Date Noted  . Splenic artery aneurysm 04/14/2014  . Arthralgia of hands, bilateral 01/21/2014  . Trigger finger of left hand 01/21/2014  . Vitamin D deficiency 01/21/2014  . HTN (hypertension) 11/11/2012  . HLD (hyperlipidemia) 11/11/2012  . Type 2 diabetes mellitus 11/11/2012  . Restless legs syndrome 11/11/2012  . GERD (gastroesophageal reflux disease) 11/11/2012   Outpatient Encounter Prescriptions as of 09/21/2014  Medication Sig  . acetaminophen (TYLENOL) 500 MG tablet Take 500 mg by mouth every 6 (six) hours as needed.  Marland Kitchen aspirin 81 MG EC tablet Take 1 tablet (81 mg total) by mouth daily. Swallow whole.  . ciprofloxacin (CIPRO) 500 MG tablet Take 1 tablet (500 mg total) by mouth 2 (two) times daily.  Marland Kitchen glucose blood (ONETOUCH VERIO) test strip Use to check BG qd to bid.  Dx:  E11.9 type 2 diabetes  . INVOKANA 100 MG TABS tablet TAKE ONE TABLET BY MOUTH ONCE EVERY DAY  . lisinopril (PRINIVIL,ZESTRIL) 5 MG tablet Take 1 tablet (5 mg total) by mouth daily.  . meloxicam (MOBIC) 15 MG tablet TAKE ONE TABLET BY  MOUTH ONCE EVERY DAY  . metroNIDAZOLE (FLAGYL) 500 MG tablet Take 1 tablet (500 mg total) by mouth 3 (three) times daily. For infection. Take the entire course  . ONETOUCH DELICA LANCETS 40X MISC Use to check BG qd to bid.  Dx: E11.9 type 2 diabetes  . pramipexole (MIRAPEX) 0.25 MG tablet TAKE ONE(1) TABLET BY MOUTH AT BEDTIME  . ranitidine (ZANTAC) 150 MG tablet TAKE ONE TABLET BY MOUTH TWICE DAILY  . simvastatin (ZOCOR) 20 MG tablet TAKE ONE TABLET BY MOUTH DAILY IN THE EVENING  . traZODone (DESYREL) 50 MG tablet TAKE ONE(1) TABLET BY MOUTH AT BEDTIME  . Vitamin D, Ergocalciferol, (DRISDOL) 50000 UNITS CAPS capsule TAKE 1 CAPSULE (50,000 UNITS  TOTAL) BY MOUTH EVERY 7 (SEVEN) DAYS.  . [DISCONTINUED] Blood Glucose Calibration (EMBRACE CONTROL) LOW SOLN     Review of Systems  Constitutional: Negative.   HENT: Negative.   Eyes: Negative.   Respiratory: Negative.   Cardiovascular: Negative.   Gastrointestinal: Negative.   Endocrine: Negative.   Genitourinary: Positive for vaginal discharge.  Musculoskeletal: Negative.   Skin: Negative.   Allergic/Immunologic: Negative.   Neurological: Negative.   Hematological: Negative.   Psychiatric/Behavioral: Negative.        Objective:   Physical Exam  Constitutional: She is oriented to person, place, and time. She appears well-developed and well-nourished.  HENT:  Head: Normocephalic and atraumatic.  Right Ear: External ear normal.  Left Ear: External ear  normal.  Nose: Nose normal.  Mouth/Throat: Oropharynx is clear and moist.  Eyes: Conjunctivae and EOM are normal. Pupils are equal, round, and reactive to light. Right eye exhibits no discharge. Left eye exhibits no discharge. No scleral icterus.  Neck: Normal range of motion. Neck supple. No thyromegaly present.  No carotid bruits or anterior cervical adenopathy  Cardiovascular: Normal rate, regular rhythm, normal heart sounds and intact distal pulses.   No murmur heard. At 84/m    Pulmonary/Chest: Effort normal and breath sounds normal. No respiratory distress. She has no wheezes. She has no rales. She exhibits no tenderness.  Clear anteriorly and posteriorly  Abdominal: Soft. Bowel sounds are normal. She exhibits no mass. There is no tenderness. There is no rebound and no guarding.  There was no abdominal tenderness or masses. There was no left lower quadrant abdominal tenderness.  Musculoskeletal: Normal range of motion. She exhibits no edema.  Lymphadenopathy:    She has no cervical adenopathy.  Neurological: She is alert and oriented to person, place, and time. She has normal reflexes. No cranial nerve deficit.  Skin: Skin is warm and dry. No rash noted.  Psychiatric: She has a normal mood and affect. Her behavior is normal. Judgment and thought content normal.  Nursing note and vitals reviewed.  BP 134/84 mmHg  Pulse 93  Temp(Src) 97.5 F (36.4 C) (Oral)  Ht '5\' 3"'  (1.6 m)  Wt 181 lb (82.101 kg)  BMI 32.07 kg/m2        Assessment & Plan:  1. Type 2 diabetes mellitus without complication -The patient should continue with current treatment pending lab work results - POCT CBC - POCT glycosylated hemoglobin (Hb A1C)  2. HLD (hyperlipidemia) -She should continue with aggressive therapeutic lifestyle changes exercise and diet and try to do better than she has done in the past. - POCT CBC - NMR, lipoprofile  3. Essential hypertension -Blood pressure is good today continue with current treatment - POCT CBC - BMP8+EGFR - Hepatic function panel  4. Vitamin D deficiency -Continue current vitamin D and make any changes when lab work is returned - POCT CBC - Vit D  25 hydroxy (rtn osteoporosis monitoring)  5. Gastroesophageal reflux disease, esophagitis presence not specified -The patient has no epigastric pain. - POCT CBC - Hepatic function panel  6. Family history of lung cancer -We will continue to monitor this in the future with regular chest  x-rays. - POCT CBC  7. Osteoporosis screening -Continue vitamin D - POCT CBC  8. Diverticulitis of colon -She should continue and complete her antibiotic. If she has any flares with her stomach she should get back in touch with [place] immediately.  9. Vaginal discharge -We will look at a wet prep today and have told her to use Monistat vaginal cream for this discharge. - POCT Wet Prep [facility])  Meds ordered this encounter  Medications  . canagliflozin (INVOKANA) 100 MG TABS tablet    Sig: Take 1 tablet (100 mg total) by mouth daily.    Dispense:  90 tablet    Refill:  3  . lisinopril (PRINIVIL,ZESTRIL) 5 MG tablet    Sig: Take 1 tablet (5 mg total) by mouth daily.    Dispense:  90 tablet    Refill:  3  . meloxicam (MOBIC) 15 MG tablet    Sig: Take 1 tablet (15 mg total) by mouth daily.    Dispense:  90 tablet    Refill:  3  . simvastatin (ZOCOR)  20 MG tablet    Sig: TAKE ONE TABLET BY MOUTH DAILY IN THE EVENING    Dispense:  90 tablet    Refill:  3  . pramipexole (MIRAPEX) 0.25 MG tablet    Sig: TAKE ONE(1) TABLET BY MOUTH AT BEDTIME    Dispense:  90 tablet    Refill:  3  . Vitamin D, Ergocalciferol, (DRISDOL) 50000 UNITS CAPS capsule    Sig: TAKE 1 CAPSULE (50,000 UNITS  TOTAL) BY MOUTH EVERY 7 (SEVEN) DAYS.    Dispense:  12 capsule    Refill:  3  . traZODone (DESYREL) 50 MG tablet    Sig: TAKE ONE(1) TABLET BY MOUTH AT BEDTIME    Dispense:  90 tablet    Refill:  3  . ranitidine (ZANTAC) 150 MG tablet    Sig: Take 1 tablet (150 mg total) by mouth 2 (two) times daily.    Dispense:  180 tablet    Refill:  3   Patient Instructions                       Medicare Annual Wellness Visit  [place] and the medical providers at [facility] strive to bring you the best medical care.  In doing so we not only want to address your current medical conditions and concerns but also to detect new conditions early and prevent illness, disease and  health-related problems.    Medicare offers a yearly Wellness Visit which allows our clinical staff to assess your need for preventative services including immunizations, lifestyle education, counseling to decrease risk of preventable diseases and screening for fall risk and other medical concerns.    This visit is provided free of charge (no copay) for all Medicare recipients. The clinical pharmacists at [place] [facility] have begun to conduct these Wellness Visits which will also include a thorough review of all your medications.    As you primary medical provider recommend that you make an appointment for your Annual Wellness Visit if you have not done so already this year.  You may set up this appointment before you leave today or you may call back ([phone]) and schedule an appointment.  Please make sure when you call that you mention that you are scheduling your Annual Wellness Visit with the clinical pharmacist so that the appointment may be made for the proper length of time.      Continue current medications. Continue good therapeutic lifestyle changes which include good diet and exercise. Fall precautions discussed with patient. If an FOBT was given today- please return it to our front desk. If you are over 86 years old - you may need Prevnar 58 or the adult Pneumonia vaccine.  Flu Shots are still available at our office. If you still haven't had one please call to set up a nurse visit to get one.   After your visit with [place] today you will receive a survey in the mail or online from [employer] regarding your care with [place]. Please take a moment to fill this out. Your feedback is very important to [place] as you can help [place] better understand your patient needs as well as improve your experience and satisfaction. WE CARE ABOUT YOU!!!   Seizure still taking antibiotics for the diverticulitis, he should follow sore of a bland diet things are baked embroiled with no fiber until the  treatment is completed. Then he can have back more fiber which includes fresh vegetables  and fruits etc. once your antibiotic treatment is completed. Overall I would stay away from nuts popcorn etc. and thinks that you know that flareup your diverticulitis. If you continue to have trouble with this and with increasing pain when she finished the antibiotic, he should get back in touch with [place]      Diverticulosis Diverticulosis is the condition that develops when small pouches (diverticula) form in the wall of your colon. Your colon, or large intestine, is where water is absorbed and stool is formed. The pouches form when the inside layer of your colon pushes through weak spots in the outer layers of your colon. CAUSES  No one knows exactly what causes diverticulosis. RISK FACTORS  Being older than 40. Your risk for this condition increases with age. Diverticulosis is rare in people younger than 40 years. By age 1, almost everyone has it.  Eating a low-fiber diet.  Being frequently constipated.  Being overweight.  Not getting enough exercise.  Smoking.  Taking over-the-counter pain medicines, like aspirin and ibuprofen. SYMPTOMS  Most people with diverticulosis do not have symptoms. DIAGNOSIS  Because diverticulosis often has no symptoms, health care providers often discover the condition during an exam for other colon problems. In many cases, a health care provider will diagnose diverticulosis while using a flexible scope to examine the colon (colonoscopy). TREATMENT  If you have never developed an infection related to diverticulosis, you may not need treatment. If you have had an infection before, treatment may include:  Eating more fruits, vegetables, and grains.  Taking a fiber supplement.  Taking a live bacteria supplement (probiotic).  Taking medicine to relax your colon. HOME CARE INSTRUCTIONS   Drink at least 6-8 glasses of water each day to prevent  constipation.  Try not to strain when you have a bowel movement.  Keep all follow-up appointments. If you have had an infection before:  Increase the fiber in your diet as directed by your health care provider or dietitian.  Take a dietary fiber supplement if your health care provider approves.  Only take medicines as directed by your health care provider. SEEK MEDICAL CARE IF:   You have abdominal pain.  You have bloating.  You have cramps.  You have not gone to the bathroom in 3 days. SEEK IMMEDIATE MEDICAL CARE IF:   Your pain gets worse.  Yourbloating becomes very bad.  You have a fever or chills, and your symptoms suddenly get worse.  You begin vomiting.  You have bowel movements that are bloody or black. MAKE SURE YOU:  Understand these instructions.  Will watch your condition.  Will get help right away if you are not doing well or get worse. Document Released: 04/06/2004 Document Revised: 07/15/2013 Document Reviewed: 06/04/2013 [facility] Patient Information 2015 [place], [place]. This information is not intended to replace advice given to you by your health care provider. Make sure you discuss any questions you have with your health care provider.    Arrie Senate MD

## 2014-09-21 NOTE — Patient Instructions (Addendum)
Medicare Annual Wellness Visit  [place] and the medical providers at [facility] strive to bring you the best medical care.  In doing so we not only want to address your current medical conditions and concerns but also to detect new conditions early and prevent illness, disease and health-related problems.    Medicare offers a yearly Wellness Visit which allows our clinical staff to assess your need for preventative services including immunizations, lifestyle education, counseling to decrease risk of preventable diseases and screening for fall risk and other medical concerns.    This visit is provided free of charge (no copay) for all Medicare recipients. The clinical pharmacists at [place] have begun to conduct these Wellness Visits which will also include a thorough review of all your medications.    As you primary medical provider recommend that you make an appointment for your Annual Wellness Visit if you have not done so already this year.  You may set up this appointment before you leave today or you may call back [phone]) and schedule an appointment.  Please make sure when you call that you mention that you are scheduling your Annual Wellness Visit with the clinical pharmacist so that the appointment may be made for the proper length of time.      Continue current medications. Continue good therapeutic lifestyle changes which include good diet and exercise. Fall precautions discussed with patient. If an FOBT was given today- please return it to our front desk. If you are over 69 years old - you may need Prevnar 12 or the adult Pneumonia vaccine.  Flu Shots are still available at our office. If you still haven't had one please call to set up a nurse visit to get one.   After your visit with [place] today you will receive a survey in the mail or online from [employer] regarding your care with [place]. Please take a moment to  fill this out. Your feedback is very important to [place] as you can help [place] better understand your patient needs as well as improve your experience and satisfaction. WE CARE ABOUT YOU!!!   Seizure still taking antibiotics for the diverticulitis, he should follow sore of a bland diet things are baked embroiled with no fiber until the treatment is completed. Then he can have back more fiber which includes fresh vegetables and fruits etc. once your antibiotic treatment is completed. Overall I would stay away from nuts popcorn etc. and thinks that you know that flareup your diverticulitis. If you continue to have trouble with this and with increasing pain when she finished the antibiotic, he should get back in touch with [place]      Diverticulosis Diverticulosis is the condition that develops when small pouches (diverticula) form in the wall of your colon. Your colon, or large intestine, is where water is absorbed and stool is formed. The pouches form when the inside layer of your colon pushes through weak spots in the outer layers of your colon. CAUSES  No one knows exactly what causes diverticulosis. RISK FACTORS  Being older than 33. Your risk for this condition increases with age. Diverticulosis is rare in people younger than 40 years. By age 37, almost everyone has it.  Eating a low-fiber diet.  Being frequently constipated.  Being overweight.  Not getting enough exercise.  Smoking.  Taking over-the-counter pain medicines, like aspirin and ibuprofen. SYMPTOMS  Most people with diverticulosis do not have symptoms. DIAGNOSIS  Because diverticulosis often has  no symptoms, health care providers often discover the condition during an exam for other colon problems. In many cases, a health care provider will diagnose diverticulosis while using a flexible scope to examine the colon (colonoscopy). TREATMENT  If you have never developed an infection related to diverticulosis, you may not need  treatment. If you have had an infection before, treatment may include:  Eating more fruits, vegetables, and grains.  Taking a fiber supplement.  Taking a live bacteria supplement (probiotic).  Taking medicine to relax your colon. HOME CARE INSTRUCTIONS   Drink at least 6-8 glasses of water each day to prevent constipation.  Try not to strain when you have a bowel movement.  Keep all follow-up appointments. If you have had an infection before:  Increase the fiber in your diet as directed by your health care provider or dietitian.  Take a dietary fiber supplement if your health care provider approves.  Only take medicines as directed by your health care provider. SEEK MEDICAL CARE IF:   You have abdominal pain.  You have bloating.  You have cramps.  You have not gone to the bathroom in 3 days. SEEK IMMEDIATE MEDICAL CARE IF:   Your pain gets worse.  Yourbloating becomes very bad.  You have a fever or chills, and your symptoms suddenly get worse.  You begin vomiting.  You have bowel movements that are bloody or black. MAKE SURE YOU:  Understand these instructions.  Will watch your condition.  Will get help right away if you are not doing well or get worse. Document Released: 04/06/2004 Document Revised: 07/15/2013 Document Reviewed: 06/04/2013 [facility] Patient Information 2015 [place], [place]. This information is not intended to replace advice given to you by your health care provider. Make sure you discuss any questions you have with your health care provider.

## 2014-09-22 LAB — HEPATIC FUNCTION PANEL
ALBUMIN: 4.3 g/dL (ref 3.6–4.8)
ALT: 31 IU/L (ref 0–32)
AST: 31 IU/L (ref 0–40)
Alkaline Phosphatase: 89 IU/L (ref 39–117)
BILIRUBIN TOTAL: 0.3 mg/dL (ref 0.0–1.2)
Bilirubin, Direct: 0.09 mg/dL (ref 0.00–0.40)
Total Protein: 6.6 g/dL (ref 6.0–8.5)

## 2014-09-22 LAB — BMP8+EGFR
BUN/Creatinine Ratio: 10 — ABNORMAL LOW (ref 11–26)
BUN: 8 mg/dL (ref 8–27)
CALCIUM: 9.6 mg/dL (ref 8.7–10.3)
CO2: 19 mmol/L (ref 18–29)
Chloride: 104 mmol/L (ref 97–108)
Creatinine, Ser: 0.82 mg/dL (ref 0.57–1.00)
GFR calc Af Amer: 86 mL/min/{1.73_m2} (ref >59)
GFR, EST NON AFRICAN AMERICAN: 75 mL/min/{1.73_m2} (ref >59)
GLUCOSE: 141 mg/dL — AB (ref 65–99)
POTASSIUM: 4.2 mmol/L (ref 3.5–5.2)
Sodium: 139 mmol/L (ref 134–144)

## 2014-09-22 LAB — NMR, LIPOPROFILE
Cholesterol: 165 mg/dL (ref 100–199)
HDL Cholesterol by NMR: 47 mg/dL (ref >39)
HDL Particle Number: 30.5 umol/L (ref >=30.5)
LDL PARTICLE NUMBER: 1067 nmol/L — AB (ref <1000)
LDL Size: 20.7 nm (ref >20.5)
LDL-C: 86 mg/dL (ref 0–99)
LP-IR Score: 76 — ABNORMAL HIGH (ref <=45)
Small LDL Particle Number: 330 nmol/L (ref <=527)
TRIGLYCERIDES BY NMR: 158 mg/dL — AB (ref 0–149)

## 2014-09-22 LAB — VITAMIN D 25 HYDROXY (VIT D DEFICIENCY, FRACTURES): Vit D, 25-Hydroxy: 41.9 ng/mL (ref 30.0–100.0)

## 2014-09-23 ENCOUNTER — Telehealth: Payer: Self-pay | Admitting: *Deleted

## 2014-09-23 NOTE — Telephone Encounter (Signed)
-----   Message from Chipper Herb, MD sent at 09/22/2014  8:17 AM EST ----- The blood sugar is elevated at 141. The creatinine, the most important kidney function test is within normal limits. The electrolytes including potassium are within normal limits. All liver function tests are within normal limits. Cholesterol numbers with advanced lipid testing have improved and are not too far from the goal. The goal is to be less than 1000 and her number is 1067. The LDL C is good at 86 and the triglycerides remain slightly elevated but are improved from 4 months ago and are now 158. The vitamin D level is good and within normal limits and the patient should continue with her current treatment.

## 2014-09-23 NOTE — Telephone Encounter (Signed)
Lm - labs

## 2014-09-28 ENCOUNTER — Other Ambulatory Visit: Payer: Self-pay | Admitting: *Deleted

## 2014-09-28 MED ORDER — CANAGLIFLOZIN 100 MG PO TABS: 100.0000 mg | ORAL_TABLET | Freq: Every day | ORAL | Status: DC

## 2014-09-28 MED ORDER — RANITIDINE HCL 150 MG PO TABS: 150.0000 mg | ORAL_TABLET | Freq: Two times a day (BID) | ORAL | Status: DC

## 2014-09-28 MED ORDER — MELOXICAM 15 MG PO TABS: 15.0000 mg | ORAL_TABLET | Freq: Every day | ORAL | Status: DC

## 2014-09-28 MED ORDER — LISINOPRIL 5 MG PO TABS: 5.0000 mg | ORAL_TABLET | Freq: Every day | ORAL | Status: DC

## 2014-09-28 MED ORDER — PRAMIPEXOLE DIHYDROCHLORIDE 0.25 MG PO TABS: ORAL_TABLET | ORAL | Status: DC

## 2014-09-28 MED ORDER — SIMVASTATIN 20 MG PO TABS: ORAL_TABLET | ORAL | Status: DC

## 2014-09-28 NOTE — Addendum Note (Signed)
Addended by: Ilean China on: 09/28/2014 11:49 AM   Modules accepted: Orders

## 2014-09-28 NOTE — Telephone Encounter (Signed)
Mirapex, Zantac, and Zocor sent to pharmacy as well.

## 2014-09-28 NOTE — Telephone Encounter (Signed)
Invokana, Lisinopril, and Meloxicam sent to mail order pharmacy.

## 2014-09-29 ENCOUNTER — Other Ambulatory Visit: Payer: Self-pay

## 2014-09-29 MED ORDER — VITAMIN D (ERGOCALCIFEROL) 1.25 MG (50000 UNIT) PO CAPS: ORAL_CAPSULE | ORAL | Status: DC

## 2014-09-29 MED ORDER — TRAZODONE HCL 50 MG PO TABS: ORAL_TABLET | ORAL | Status: DC

## 2014-09-29 NOTE — Telephone Encounter (Signed)
Last seen 09/21/14 DWM  Last Vit D 09/21/14 41.9 normal  This is for mail order

## 2014-10-02 ENCOUNTER — Other Ambulatory Visit: Payer: Self-pay | Admitting: Nurse Practitioner

## 2014-10-05 NOTE — Telephone Encounter (Signed)
Last vit D level 41.9 on 08/2014

## 2014-10-06 ENCOUNTER — Other Ambulatory Visit: Payer: Self-pay | Admitting: Family Medicine

## 2014-11-10 LAB — HM DIABETES EYE EXAM

## 2014-12-02 ENCOUNTER — Telehealth: Payer: Self-pay | Admitting: Family Medicine

## 2014-12-02 MED ORDER — ACCU-CHEK SOFT TOUCH LANCETS MISC: Status: DC

## 2014-12-02 MED ORDER — GLUCOSE BLOOD VI STRP: ORAL_STRIP | Status: DC

## 2014-12-02 MED ORDER — ACCU-CHEK AVIVA DEVI: Status: DC

## 2014-12-02 NOTE — Telephone Encounter (Signed)
Done, pt aware.

## 2014-12-14 ENCOUNTER — Telehealth: Payer: Self-pay | Admitting: Family Medicine

## 2014-12-14 ENCOUNTER — Other Ambulatory Visit: Payer: Self-pay | Admitting: *Deleted

## 2014-12-14 MED ORDER — GLUCOSE BLOOD VI STRP: ORAL_STRIP | Status: DC

## 2014-12-14 MED ORDER — ACCU-CHEK AVIVA DEVI: Status: AC

## 2014-12-14 MED ORDER — ACCU-CHEK SOFT TOUCH LANCETS MISC: Status: DC

## 2015-01-27 ENCOUNTER — Encounter: Payer: Self-pay | Admitting: Family Medicine

## 2015-01-27 ENCOUNTER — Other Ambulatory Visit: Payer: Self-pay | Admitting: *Deleted

## 2015-01-27 ENCOUNTER — Ambulatory Visit (INDEPENDENT_AMBULATORY_CARE_PROVIDER_SITE_OTHER): Payer: Medicare PPO | Admitting: Family Medicine

## 2015-01-27 VITALS — BP 138/78 | HR 77 | Temp 97.2°F | Ht 63.0 in | Wt 180.0 lb

## 2015-01-27 DIAGNOSIS — E785 Hyperlipidemia, unspecified: Secondary | ICD-10-CM | POA: Diagnosis not present

## 2015-01-27 DIAGNOSIS — K219 Gastro-esophageal reflux disease without esophagitis: Secondary | ICD-10-CM

## 2015-01-27 DIAGNOSIS — G4730 Sleep apnea, unspecified: Secondary | ICD-10-CM | POA: Insufficient documentation

## 2015-01-27 DIAGNOSIS — I10 Essential (primary) hypertension: Secondary | ICD-10-CM

## 2015-01-27 DIAGNOSIS — E119 Type 2 diabetes mellitus without complications: Secondary | ICD-10-CM

## 2015-01-27 DIAGNOSIS — E559 Vitamin D deficiency, unspecified: Secondary | ICD-10-CM

## 2015-01-27 LAB — POCT CBC
GRANULOCYTE PERCENT: 65 % (ref 37–80)
HEMATOCRIT: 44 % (ref 37.7–47.9)
Hemoglobin: 15 g/dL (ref 12.2–16.2)
Lymph, poc: 2.1 (ref 0.6–3.4)
MCH, POC: 30.5 pg (ref 27–31.2)
MCHC: 34 g/dL (ref 31.8–35.4)
MCV: 89.6 fL (ref 80–97)
MPV: 7.6 fL (ref 0–99.8)
POC GRANULOCYTE: 5.1 (ref 2–6.9)
POC LYMPH PERCENT: 26.5 %L (ref 10–50)
Platelet Count, POC: 266 10*3/uL (ref 142–424)
RBC: 4.91 M/uL (ref 4.04–5.48)
RDW, POC: 13.2 %
WBC: 7.8 10*3/uL (ref 4.6–10.2)

## 2015-01-27 LAB — POCT GLYCOSYLATED HEMOGLOBIN (HGB A1C): Hemoglobin A1C: 7.4

## 2015-01-27 MED ORDER — VITAMIN D (ERGOCALCIFEROL) 1.25 MG (50000 UNIT) PO CAPS: ORAL_CAPSULE | ORAL | Status: DC

## 2015-01-27 NOTE — Progress Notes (Signed)
Subjective:    Patient ID: Theresa Wilson, female    DOB: [birth year], 69 y.o.   MRN: [id number]  HPI  Patient presents today for reevaluation of chronic medical problems which includes diabetes, hypertension and hyperlipidemia. She is taking medications regularly. This patient denies chest pain shortness of breath. She does have some problems with gas and indigestion. She is currently taking ranitidine 150 mg at bedtime. She denies any blood in the stool or black tarry bowel movements or diarrhea. She says she is passing her water without problems. She is up-to-date on all her health maintenance issues other than returning an FOBT and getting lab work today. Her blood sugars at home and been running in the 160s and 170s. 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 She is hoping to change positions and this schedule change will help her do better with controlling her blood sugar. She is intolerant of metformin because of the loose bowel movements it causes. She is passing her water without problems. The patient also has a history of sleep apnea and has not been using her CPAP machine and quite a while and needs a new prescription for 1. We will have to look into the process for doing this and whether she needs another sleep study or not. She also questions some skin lesions that have changed on her body and that are irritated.       Patient Active Problem List   Diagnosis Date Noted  . Splenic artery aneurysm 04/14/2014  . Arthralgia of hands, bilateral 01/21/2014  . Trigger finger of left hand 01/21/2014  . Vitamin D deficiency 01/21/2014  . HTN (hypertension) 11/11/2012  . HLD (hyperlipidemia) 11/11/2012  . Type 2 diabetes mellitus 11/11/2012  . Restless legs syndrome 11/11/2012  . GERD (gastroesophageal reflux disease) 11/11/2012   Outpatient Encounter Prescriptions as of 01/27/2015  Medication Sig  . acetaminophen (TYLENOL) 500 MG  tablet Take 500 mg by mouth every 6 (six) hours as needed.  Marland Kitchen aspirin 81 MG EC tablet Take 1 tablet (81 mg total) by mouth daily. Swallow whole.  . Blood Glucose Monitoring Suppl (ACCU-CHEK AVIVA) device Use to check blood sugar bid. Dx E11.9  . canagliflozin (INVOKANA) 100 MG TABS tablet Take 1 tablet (100 mg total) by mouth daily.  Marland Kitchen glucose blood (ACCU-CHEK AVIVA) test strip Test blood sugar bid . DX E.11.9  . Lancets (ACCU-CHEK SOFT TOUCH) lancets Test blood sugar bid. Dx E11.9  . lisinopril (PRINIVIL,ZESTRIL) 5 MG tablet Take 1 tablet (5 mg total) by mouth daily.  . meloxicam (MOBIC) 15 MG tablet TAKE ONE TABLET BY MOUTH ONCE EVERY DAY  . pramipexole (MIRAPEX) 0.25 MG tablet TAKE ONE(1) TABLET BY MOUTH AT BEDTIME  . ranitidine (ZANTAC) 150 MG tablet Take 1 tablet (150 mg total) by mouth 2 (two) times daily.  . simvastatin (ZOCOR) 20 MG tablet TAKE ONE TABLET BY MOUTH DAILY IN THE EVENING  . traZODone (DESYREL) 50 MG tablet TAKE ONE TABLET BY MOUTH NIGHTLY AT BEDTIME  . [DISCONTINUED] Vitamin D, Ergocalciferol, (DRISDOL) 50000 UNITS CAPS capsule TAKE 1 CAPSULE (50,000 UNITS   TOTAL) BY MOUTH EVERY 7 (SEVEN) DAYS.  . [DISCONTINUED] ciprofloxacin (CIPRO) 500 MG tablet Take 1 tablet (500 mg total) by mouth 2 (two) times daily.  . [DISCONTINUED] metroNIDAZOLE (FLAGYL) 500 MG tablet Take 1 tablet (500 mg total) by mouth 3 (three) times daily. For infection. Take the entire course   No  facility-administered encounter medications on file as of 01/27/2015.      Review of Systems  Constitutional: Negative.   HENT: Negative.   Eyes: Negative.   Respiratory: Negative.   Cardiovascular: Negative.   Gastrointestinal: Negative.   Endocrine: Negative.   Genitourinary: Negative.   Musculoskeletal: Negative.   Skin: Negative.   Allergic/Immunologic: Negative.   Neurological: Negative.   Hematological: Negative.   Psychiatric/Behavioral: Negative.        Objective:   Physical Exam    Constitutional: She is oriented to person, place, and time. She appears well-developed and well-nourished. No distress.  HENT:  Head: Normocephalic and atraumatic.  Left Ear: External ear normal.  Nose: Nose normal.  Mouth/Throat: Oropharynx is clear and moist.  Minimal ears cerumen right EAC  Eyes: Conjunctivae and EOM are normal. Pupils are equal, round, and reactive to light. Right eye exhibits no discharge. Left eye exhibits no discharge. No scleral icterus.  Neck: Normal range of motion. Neck supple. No thyromegaly present.  Cardiovascular: Normal rate, regular rhythm, normal heart sounds and intact distal pulses.   No murmur heard. At 84/m  Pulmonary/Chest: Effort normal and breath sounds normal. No respiratory distress. She has no wheezes. She has no rales. She exhibits no tenderness.  Clear anteriorly and posteriorly  Abdominal: Soft. Bowel sounds are normal. She exhibits no mass. There is no tenderness. There is no rebound and no guarding.  Musculoskeletal: Normal range of motion. She exhibits no edema or tenderness.  Lymphadenopathy:    She has no cervical adenopathy.  Neurological: She is alert and oriented to person, place, and time. She has normal reflexes. No cranial nerve deficit.  Skin: Skin is warm and dry. No rash noted.  Psychiatric: She has a normal mood and affect. Her behavior is normal. Judgment and thought content normal.  Nursing note and vitals reviewed.    BP 138/78 mmHg  Pulse 77  Temp(Src) 97.2 F (36.2 C) (Oral)  Ht '5\' 3"'  (1.6 m)  Wt 180 lb (81.647 kg)  BMI 31.89 kg/m2      Assessment & Plan:   1. HLD (hyperlipidemia) -Continue current treatment pending results of lab work - NMR, lipoprofile - POCT CBC  2. Type 2 diabetes mellitus without complication -The home blood sugars have been running higher recently and the patient attributes some of this to her work shift. When the A1c is returned we will consider adding Januvia if it is higher and we  need better control. - POCT glycosylated hemoglobin (Hb A1C) - BMP8+EGFR - POCT CBC  3. Essential hypertension -The blood pressure is good today and she should continue with current treatment - BMP8+EGFR - Hepatic function panel - POCT CBC  4. Vitamin D deficiency -Continue current treatment pending results of lab work - Vit D  25 hydroxy (rtn osteoporosis monitoring) - POCT CBC  5. Gastroesophageal reflux disease, esophagitis presence not specified -Increase the Zantac to 150 twice daily before breakfast and supper and avoid all NSAIDs including meloxicam - POCT CBC  6. Sleep apnea -We will look into helping this patient gets another CPAP machine and finding out whether she needs further testing before we can do that  7. Abnormal skin lesions -We'll schedule her for a follow-up visit with our mid-level provider to biopsy and remove these if necessary  No orders of the defined types were placed in this encounter.   Patient Instructions  Medicare Annual Wellness Visit  [facility] and the medical providers at [facility] strive to bring you the best medical care.  In doing so we not only want to address your current medical conditions and concerns but also to detect new conditions early and prevent illness, disease and health-related problems.    Medicare offers a yearly Wellness Visit which allows our clinical staff to assess your need for preventative services including immunizations, lifestyle education, counseling to decrease risk of preventable diseases and screening for fall risk and other medical concerns.    This visit is provided free of charge (no copay) for all Medicare recipients. The clinical pharmacists at [facility] have begun to conduct these Wellness Visits which will also include a thorough review of all your medications.    As you primary medical provider recommend that you make an appointment  for your Annual Wellness Visit if you have not done so already this year.  You may set up this appointment before you leave today or you may call back [phone]) and schedule an appointment.  Please make sure when you call that you mention that you are scheduling your Annual Wellness Visit with the clinical pharmacist so that the appointment may be made for the proper length of time.     Continue current medications. Continue good therapeutic lifestyle changes which include good diet and exercise. Fall precautions discussed with patient. If an FOBT was given today- please return it to our front desk. If you are over 45 years old - you may need Prevnar 71 or the adult Pneumonia vaccine.  Flu Shots are still available at our office. If you still haven't had one please call to set up a nurse visit to get one.   After your visit with [place] today you will receive a survey in the mail or online from [employer] regarding your care with [place]. Please take a moment to fill this out. Your feedback is very important to [place] as you can help [place] better understand your patient needs as well as improve your experience and satisfaction. WE CARE ABOUT YOU!!!   The patient should continue to follow her diet closely and get as much exercise as possible When her hemoglobin A1c is returned, we will consider adding another medication to her treatment regimen if the A1c is elevated. We are considering Januvia as she is intolerant of metformin. We will arrange for her to have an evaluation of her skin lesions by one of the providers in the office at a future visit We will look into helping her with getting a new CPAP machine Continue to monitor blood sugars closely Avoid all NSAIDs Increase ranitidine and taking one before breakfast and one before dinner every night   Arrie Senate MD

## 2015-01-27 NOTE — Patient Instructions (Addendum)
Medicare Annual Wellness Visit  [facility] and the medical providers at [facility] strive to bring you the best medical care.  In doing so we not only want to address your current medical conditions and concerns but also to detect new conditions early and prevent illness, disease and health-related problems.    Medicare offers a yearly Wellness Visit which allows our clinical staff to assess your need for preventative services including immunizations, lifestyle education, counseling to decrease risk of preventable diseases and screening for fall risk and other medical concerns.    This visit is provided free of charge (no copay) for all Medicare recipients. The clinical pharmacists at [facility] have begun to conduct these Wellness Visits which will also include a thorough review of all your medications.    As you primary medical provider recommend that you make an appointment for your Annual Wellness Visit if you have not done so already this year.  You may set up this appointment before you leave today or you may call back [phone]) and schedule an appointment.  Please make sure when you call that you mention that you are scheduling your Annual Wellness Visit with the clinical pharmacist so that the appointment may be made for the proper length of time.     Continue current medications. Continue good therapeutic lifestyle changes which include good diet and exercise. Fall precautions discussed with patient. If an FOBT was given today- please return it to our front desk. If you are over 64 years old - you may need Prevnar 27 or the adult Pneumonia vaccine.  Flu Shots are still available at our office. If you still haven't had one please call to set up a nurse visit to get one.   After your visit with [place] today you will receive a survey in the mail or online from [employer] regarding your care with [place]. Please take a moment to  fill this out. Your feedback is very important to [place] as you can help [place] better understand your patient needs as well as improve your experience and satisfaction. WE CARE ABOUT YOU!!!   The patient should continue to follow her diet closely and get as much exercise as possible When her hemoglobin A1c is returned, we will consider adding another medication to her treatment regimen if the A1c is elevated. We are considering Januvia as she is intolerant of metformin. We will arrange for her to have an evaluation of her skin lesions by one of the providers in the office at a future visit We will look into helping her with getting a new CPAP machine Continue to monitor blood sugars closely Avoid all NSAIDs Increase ranitidine and taking one before breakfast and one before dinner every night

## 2015-01-28 LAB — NMR, LIPOPROFILE
Cholesterol: 202 mg/dL — ABNORMAL HIGH (ref 100–199)
HDL Cholesterol by NMR: 47 mg/dL (ref >39)
HDL Particle Number: 33.4 umol/L (ref >=30.5)
LDL Particle Number: 1568 nmol/L — ABNORMAL HIGH (ref <1000)
LDL Size: 20.7 nm (ref >20.5)
LDL-C: 116 mg/dL — ABNORMAL HIGH (ref 0–99)
LP-IR Score: 75 — ABNORMAL HIGH (ref <=45)
Small LDL Particle Number: 928 nmol/L — ABNORMAL HIGH (ref <=527)
Triglycerides by NMR: 195 mg/dL — ABNORMAL HIGH (ref 0–149)

## 2015-01-28 LAB — HEPATIC FUNCTION PANEL
ALBUMIN: 4.5 g/dL (ref 3.6–4.8)
ALT: 17 IU/L (ref 0–32)
AST: 14 IU/L (ref 0–40)
Alkaline Phosphatase: 104 IU/L (ref 39–117)
BILIRUBIN TOTAL: 0.3 mg/dL (ref 0.0–1.2)
Bilirubin, Direct: 0.09 mg/dL (ref 0.00–0.40)
TOTAL PROTEIN: 7.1 g/dL (ref 6.0–8.5)

## 2015-01-28 LAB — VITAMIN D 25 HYDROXY (VIT D DEFICIENCY, FRACTURES): Vit D, 25-Hydroxy: 28.2 ng/mL — ABNORMAL LOW (ref 30.0–100.0)

## 2015-01-28 LAB — BMP8+EGFR
BUN/Creatinine Ratio: 17 (ref 11–26)
BUN: 15 mg/dL (ref 8–27)
CO2: 21 mmol/L (ref 18–29)
Calcium: 9.8 mg/dL (ref 8.7–10.3)
Chloride: 101 mmol/L (ref 97–108)
Creatinine, Ser: 0.87 mg/dL (ref 0.57–1.00)
GFR calc Af Amer: 80 mL/min/{1.73_m2} (ref >59)
GFR calc non Af Amer: 70 mL/min/{1.73_m2} (ref >59)
Glucose: 149 mg/dL — ABNORMAL HIGH (ref 65–99)
Potassium: 4.6 mmol/L (ref 3.5–5.2)
Sodium: 141 mmol/L (ref 134–144)

## 2015-01-29 ENCOUNTER — Other Ambulatory Visit: Payer: Self-pay | Admitting: *Deleted

## 2015-01-29 MED ORDER — ROSUVASTATIN CALCIUM 20 MG PO TABS: 20.0000 mg | ORAL_TABLET | Freq: Every day | ORAL | Status: DC

## 2015-01-29 NOTE — Addendum Note (Signed)
Addended by: Ilean China on: 01/29/2015 09:47 AM   Modules accepted: Orders

## 2015-02-10 ENCOUNTER — Ambulatory Visit (INDEPENDENT_AMBULATORY_CARE_PROVIDER_SITE_OTHER): Payer: Medicare PPO | Admitting: Physician Assistant

## 2015-02-10 ENCOUNTER — Encounter: Payer: Self-pay | Admitting: Physician Assistant

## 2015-02-10 ENCOUNTER — Telehealth: Payer: Self-pay | Admitting: *Deleted

## 2015-02-10 VITALS — BP 120/72 | HR 80 | Temp 97.0°F | Ht 63.0 in | Wt 182.0 lb

## 2015-02-10 DIAGNOSIS — L820 Inflamed seborrheic keratosis: Secondary | ICD-10-CM | POA: Diagnosis not present

## 2015-02-10 DIAGNOSIS — D485 Neoplasm of uncertain behavior of skin: Secondary | ICD-10-CM | POA: Diagnosis not present

## 2015-02-10 DIAGNOSIS — D1801 Hemangioma of skin and subcutaneous tissue: Secondary | ICD-10-CM | POA: Diagnosis not present

## 2015-02-10 DIAGNOSIS — L918 Other hypertrophic disorders of the skin: Secondary | ICD-10-CM

## 2015-02-10 NOTE — Progress Notes (Signed)
   Subjective:    Patient ID: Theresa Wilson, female    DOB: [birth year], 69 y.o.   MRN: [id number]  HPI 69 y/o female presents for new occuring lesions on neck, back, breast that are irritated by clothing. No history of skin cancer or dysplastic nevi.     Review of Systems  Constitutional: Negative.   Skin:       New occuring lesions on neck, back, chest. Pigments and flesh colored. Irritated by clothing and jewelry.        Objective:   Physical Exam  Skin:  Multiple benign appearing angiomas, seborrheic keratosis and skin tags. Several skin tags appear irritated and have bled. Flesh colored lesion on chest approximately 1cm with hyperpigmentation on 3:00 border will be biopsied to r/o dysplasia.   Large angioma, 1cm, biopsied to r/o dysplasia  Actinic keratosis appearing lesions on BLE treated with cryosurgery x 3           Assessment & Plan:  1. Neoplasm of uncertain behavior of skin  - Pathology  2. Inflamed seborrheic keratosis  - Pathology  3. Cutaneous skin tags - skin tags on neck irritated by clothing were treated with cryosurgery x 4, excised via scissors x 5   Continue all meds Labs pending Health Maintenance reviewed Diet and exercise encouraged RTO prn }  Tiffany A. Benjamin Stain PA-C

## 2015-02-10 NOTE — Telephone Encounter (Signed)
Pt states that she is still on the left over Simvastatin she had. She states that we gave her a RX for crestor and that she now remembers that in the past taking crestor increased her LFT's. She does not want to restart it --- ??  Also,  She states that since we stopped the [facility] she is having headaches more often.  She is aware that it will be Monday before she gets an answer.

## 2015-02-10 NOTE — Telephone Encounter (Signed)
Do not restart Crestor if she had a problem with this. Ibuprofen 200 mg 1 or 2 at a time 3 times daily after meals as needed, as this will be less potent than meloxicam and may not cause as many side effects

## 2015-02-11 NOTE — Telephone Encounter (Signed)
Pt called and aware

## 2015-02-15 LAB — PATHOLOGY

## 2015-03-08 ENCOUNTER — Other Ambulatory Visit: Payer: Self-pay | Admitting: Family Medicine

## 2015-03-08 NOTE — Telephone Encounter (Signed)
Last seen 02/10/15  Theresa Wilson

## 2015-04-16 ENCOUNTER — Other Ambulatory Visit: Payer: Self-pay | Admitting: Family Medicine

## 2015-05-18 ENCOUNTER — Other Ambulatory Visit: Payer: Self-pay | Admitting: Family Medicine

## 2015-05-18 NOTE — Telephone Encounter (Signed)
Last seen 02/10/15 Theresa Wilson  Requesting 90 day supply

## 2015-05-18 NOTE — Telephone Encounter (Signed)
The patient should be seen every 3-4 months. It is okay to refill this.

## 2015-05-19 NOTE — Telephone Encounter (Signed)
Last seen 02/12/15  Tiffany

## 2015-06-09 ENCOUNTER — Other Ambulatory Visit: Payer: Self-pay | Admitting: Family Medicine

## 2015-06-09 NOTE — Telephone Encounter (Signed)
Las seen 02/16/15  Tiffany  Last Vit D 01/27/15  28.2

## 2015-06-22 ENCOUNTER — Ambulatory Visit (INDEPENDENT_AMBULATORY_CARE_PROVIDER_SITE_OTHER): Payer: Medicare PPO | Admitting: Family Medicine

## 2015-06-22 ENCOUNTER — Encounter: Payer: Self-pay | Admitting: Family Medicine

## 2015-06-22 VITALS — BP 135/83 | HR 85 | Temp 97.8°F | Ht 63.0 in | Wt 180.0 lb

## 2015-06-22 DIAGNOSIS — R51 Headache: Secondary | ICD-10-CM | POA: Diagnosis not present

## 2015-06-22 DIAGNOSIS — E559 Vitamin D deficiency, unspecified: Secondary | ICD-10-CM | POA: Diagnosis not present

## 2015-06-22 DIAGNOSIS — I10 Essential (primary) hypertension: Secondary | ICD-10-CM | POA: Diagnosis not present

## 2015-06-22 DIAGNOSIS — E119 Type 2 diabetes mellitus without complications: Secondary | ICD-10-CM | POA: Diagnosis not present

## 2015-06-22 DIAGNOSIS — R519 Headache, unspecified: Secondary | ICD-10-CM

## 2015-06-22 DIAGNOSIS — E785 Hyperlipidemia, unspecified: Secondary | ICD-10-CM | POA: Diagnosis not present

## 2015-06-22 DIAGNOSIS — I728 Aneurysm of other specified arteries: Secondary | ICD-10-CM | POA: Diagnosis not present

## 2015-06-22 LAB — POCT GLYCOSYLATED HEMOGLOBIN (HGB A1C): Hemoglobin A1C: 7.9

## 2015-06-22 MED ORDER — SIMVASTATIN 20 MG PO TABS: ORAL_TABLET | ORAL | Status: DC

## 2015-06-22 NOTE — Patient Instructions (Addendum)
Medicare Annual Wellness Visit  [facility] and the medical providers at [facility] strive to bring you the best medical care.  In doing so we not only want to address your current medical conditions and concerns but also to detect new conditions early and prevent illness, disease and health-related problems.    Medicare offers a yearly Wellness Visit which allows our clinical staff to assess your need for preventative services including immunizations, lifestyle education, counseling to decrease risk of preventable diseases and screening for fall risk and other medical concerns.    This visit is provided free of charge (no copay) for all Medicare recipients. The clinical pharmacists at [facility] have begun to conduct these Wellness Visits which will also include a thorough review of all your medications.    As you primary medical provider recommend that you make an appointment for your Annual Wellness Visit if you have not done so already this year.  You may set up this appointment before you leave today or you may call back [phone] and schedule an appointment.  Please make sure when you call that you mention that you are scheduling your Annual Wellness Visit with the clinical pharmacist so that the appointment may be made for the proper length of time.     Continue current medications. Continue good therapeutic lifestyle changes which include good diet and exercise. Fall precautions discussed with patient. If an FOBT was given today- please return it to our front desk. If you are over 67 years old - you may need Prevnar 73 or the adult Pneumonia vaccine.  **Flu shots are available--- please call and schedule a FLU-CLINIC appointment**  After your visit with [place] today you will receive a survey in the mail or online from [employer] regarding your care with [place]. Please take a moment to fill this out. Your feedback is very  important to [place] as you can help [place] better understand your patient needs as well as improve your experience and satisfaction. WE CARE ABOUT YOU!!!   A CT scan will be arranged for follow-up of the splenic artery aneurysm The patient should continue with her diet regimen and exercise regimen to keep her blood sugar under good control.  She should return the FOBT. We will call with lab work results as soon as those results become available Try Flonase over-the-counter 1 spray each nostril at bedtime to see if this helps allergies and ultimately headaches

## 2015-06-22 NOTE — Addendum Note (Signed)
Addended by: Zannie Cove on: 06/22/2015 09:42 AM   Modules accepted: Orders

## 2015-06-22 NOTE — Progress Notes (Signed)
Subjective:    Patient ID: Theresa Wilson, female    DOB: 08-14-1945, 69 y.o.   MRN: [id number]  HPI Pt here for follow up and management of chronic medical problems which includes hypertension, hyperlipidemia, and diabetes. Patient is taking medications regularly. The patient's main complaint today is headache. Her diagnoses are as noted above. The patient is currently working second shift. She is getting ready to go home for shift work. She thinks this will provide more regularity to her life. 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 She has a history of a splenic artery aneurysm and the radiologist recommended repeating this test evaluation in one year and that would've been this past summer. This will be arranged for her to get this repeated soon. She is due to return an FOBT. She has had her eye exam at the [facility] in [place] and she has received her flu shot at the hospital where she is working. She denies chest pain shortness of breath trouble swallowing heartburn indigestion and nausea vomiting diarrhea or blood in the stool. She is passing her water without problems. She indicates that her blood sugars at home have been running in the 150-200 range fasting.      Patient Active Problem List   Diagnosis Date Noted  . Sleep apnea 01/27/2015  . Splenic artery aneurysm ([place]) 04/14/2014  . Arthralgia of hands, bilateral 01/21/2014  . Trigger finger of left hand 01/21/2014  . Vitamin D deficiency 01/21/2014  . HTN (hypertension) 11/11/2012  . HLD (hyperlipidemia) 11/11/2012  . Type 2 diabetes mellitus ([place]) 11/11/2012  . Restless legs syndrome 11/11/2012  . GERD (gastroesophageal reflux disease) 11/11/2012   Outpatient Encounter Prescriptions as of 06/22/2015  Medication Sig  . acetaminophen (TYLENOL) 500 MG tablet Take 500 mg by mouth every 6 (six) hours as needed.  Marland Kitchen aspirin 81 MG EC tablet Take 1 tablet (81 mg total) by mouth daily. Swallow  whole.  . Blood Glucose Monitoring Suppl (ACCU-CHEK AVIVA) device Use to check blood sugar bid. Dx E11.9  . glucose blood (ACCU-CHEK AVIVA) test strip Test blood sugar bid . DX E.11.9  . INVOKANA 100 MG TABS tablet TAKE 1 TABLET EVERY DAY  . Lancets (ACCU-CHEK SOFT TOUCH) lancets Test blood sugar bid. Dx E11.9  . lisinopril (PRINIVIL,ZESTRIL) 5 MG tablet TAKE 1 TABLET EVERY DAY  . pramipexole (MIRAPEX) 0.25 MG tablet TAKE 1 TABLET AT BEDTIME  . ranitidine (ZANTAC) 150 MG tablet TAKE 1 TABLET TWICE DAILY  . simvastatin (ZOCOR) 20 MG tablet TAKE ONE TABLET BY MOUTH DAILY IN THE EVENING  . traZODone (DESYREL) 50 MG tablet TAKE 1 TABLET ONE TIME DAILY AT BEDTIME  . Vitamin D, Ergocalciferol, (DRISDOL) 50000 UNITS CAPS capsule TAKE 1 CAPSULE EVERY 7 DAYS.   No facility-administered encounter medications on file as of 06/22/2015.        Review of Systems  Constitutional: Negative.   Eyes: Negative.   Respiratory: Negative.   Cardiovascular: Negative.   Gastrointestinal: Negative.   Endocrine: Negative.   Genitourinary: Negative.   Musculoskeletal: Negative.   Skin: Negative.   Allergic/Immunologic: Negative.   Neurological: Positive for headaches.  Hematological: Negative.   Psychiatric/Behavioral: Negative.        Objective:   Physical Exam  Constitutional: She is oriented to person, place, and time. She appears well-developed and well-nourished. No distress.  HENT:  Head: Normocephalic and atraumatic.  Right Ear: External ear normal.  Left Ear: External ear normal.  Mouth/Throat: Oropharynx  is clear and moist. No oropharyngeal exudate.  Some nasal congestion bilaterally  Eyes: Conjunctivae and EOM are normal. Pupils are equal, round, and reactive to light. Right eye exhibits no discharge. Left eye exhibits no discharge. No scleral icterus.  Neck: Normal range of motion. Neck supple. No thyromegaly present.  Without bruits thyromegaly or anterior cervical adenopathy    Cardiovascular: Normal rate, regular rhythm, normal heart sounds and intact distal pulses.  Exam reveals no friction rub.   No murmur heard. At 72/m  Pulmonary/Chest: Effort normal and breath sounds normal. No respiratory distress. She has no wheezes. She has no rales. She exhibits no tenderness.  Clear anteriorly and posteriorly  Abdominal: Soft. Bowel sounds are normal. She exhibits no mass. There is no tenderness. There is no rebound and no guarding.  No abdominal tenderness liver or spleen enlargement with normal bowel sounds  Musculoskeletal: Normal range of motion. She exhibits no edema or tenderness.  Lymphadenopathy:    She has no cervical adenopathy.  Neurological: She is alert and oriented to person, place, and time. She has normal reflexes. No cranial nerve deficit.  Skin: Skin is warm and dry. No rash noted.  Psychiatric: She has a normal mood and affect. Her behavior is normal. Judgment and thought content normal.  Nursing note and vitals reviewed.  BP 135/83 mmHg  Pulse 85  Temp(Src) 97.8 F (36.6 C) (Oral)  Ht 5\' 3"  (1.6 m)  Wt 180 lb (81.647 kg)  BMI 31.89 kg/m2        Assessment & Plan:  1. HLD (hyperlipidemia) -Continue current treatment pending results of lab work  2. Type 2 diabetes mellitus without complication, without long-term current use of insulin ([place]) -Continue current treatment pending results of lab work  3. Essential hypertension -Blood pressure is good today no change in treatment  4. Vitamin D deficiency -Continue vitamin D 50,000 units pending results of lab work  5. Headache around the eyes -Try Flonase 1 spray each nostril at bedtime  6. Splenic artery aneurysm ([place]) -CT scan for follow-up is being planned   No orders of the defined types were placed in this encounter.   Simvastatin will be refilled.  Patient Instructions                       Medicare Annual Wellness Visit  [facility] and the medical providers at [facility] strive to bring you the best medical care.  In doing so we not only want to address your current medical conditions and concerns but also to detect new conditions early and prevent illness, disease and health-related problems.    Medicare offers a yearly Wellness Visit which allows our clinical staff to assess your need for preventative services including immunizations, lifestyle education, counseling to decrease risk of preventable diseases and screening for fall risk and other medical concerns.    This visit is provided free of charge (no copay) for all Medicare recipients. The clinical pharmacists at [facility] have begun to conduct these Wellness Visits which will also include a thorough review of all your medications.    As you primary medical provider recommend that you make an appointment for your Annual Wellness Visit if you have not done so already this year.  You may set up this appointment before you leave today or you may call back [phone]) and schedule an appointment.  Please make sure when you call that you mention that you are scheduling  your Annual Wellness Visit with the clinical pharmacist so that the appointment may be made for the proper length of time.     Continue current medications. Continue good therapeutic lifestyle changes which include good diet and exercise. Fall precautions discussed with patient. If an FOBT was given today- please return it to our front desk. If you are over 71 years old - you may need Prevnar 81 or the adult Pneumonia vaccine.  **Flu shots are available--- please call and schedule a FLU-CLINIC appointment**  After your visit with [place] today you will receive a survey in the mail or online from [employer] regarding your care with [place]. Please take a moment to fill this out. Your feedback is very important to [place] as you can help [place] better understand your patient needs as well as improve your experience and  satisfaction. WE CARE ABOUT YOU!!!   A CT scan will be arranged for follow-up of the splenic artery aneurysm The patient should continue with her diet regimen and exercise regimen to keep her blood sugar under good control.  She should return the FOBT. We will call with lab work results as soon as those results become available Try Flonase over-the-counter 1 spray each nostril at bedtime to see if this helps allergies and ultimately headaches   Arrie Senate MD

## 2015-06-23 ENCOUNTER — Encounter: Payer: Self-pay | Admitting: *Deleted

## 2015-06-23 LAB — CBC WITH DIFFERENTIAL/PLATELET
BASOS: 1 %
Basophils Absolute: 0 10*3/uL (ref 0.0–0.2)
EOS (ABSOLUTE): 0.2 10*3/uL (ref 0.0–0.4)
Eos: 3 %
Hematocrit: 40.4 % (ref 34.0–46.6)
Hemoglobin: 14.1 g/dL (ref 11.1–15.9)
IMMATURE GRANULOCYTES: 0 %
Immature Grans (Abs): 0 10*3/uL (ref 0.0–0.1)
Lymphocytes Absolute: 1.5 10*3/uL (ref 0.7–3.1)
Lymphs: 23 %
MCH: 29.9 pg (ref 26.6–33.0)
MCHC: 34.9 g/dL (ref 31.5–35.7)
MCV: 86 fL (ref 79–97)
MONOS ABS: 0.3 10*3/uL (ref 0.1–0.9)
Monocytes: 5 %
NEUTROS PCT: 68 %
Neutrophils Absolute: 4.5 10*3/uL (ref 1.4–7.0)
PLATELETS: 252 10*3/uL (ref 150–379)
RBC: 4.71 x10E6/uL (ref 3.77–5.28)
RDW: 13.8 % (ref 12.3–15.4)
WBC: 6.5 10*3/uL (ref 3.4–10.8)

## 2015-06-23 LAB — NMR, LIPOPROFILE
Cholesterol: 265 mg/dL — ABNORMAL HIGH (ref 100–199)
HDL CHOLESTEROL BY NMR: 40 mg/dL (ref >39)
HDL Particle Number: 24 umol/L — ABNORMAL LOW (ref >=30.5)
LDL PARTICLE NUMBER: 2297 nmol/L — AB (ref <1000)
LDL Size: 20.9 nm (ref >20.5)
LDL-C: 191 mg/dL — AB (ref 0–99)
LP-IR Score: 71 — ABNORMAL HIGH (ref <=45)
SMALL LDL PARTICLE NUMBER: 1067 nmol/L — AB (ref <=527)
TRIGLYCERIDES BY NMR: 171 mg/dL — AB (ref 0–149)

## 2015-06-23 LAB — HEPATIC FUNCTION PANEL
ALT: 16 IU/L (ref 0–32)
AST: 13 IU/L (ref 0–40)
Albumin: 4.1 g/dL (ref 3.6–4.8)
Alkaline Phosphatase: 120 IU/L — ABNORMAL HIGH (ref 39–117)
BILIRUBIN TOTAL: 0.2 mg/dL (ref 0.0–1.2)
Bilirubin, Direct: 0.07 mg/dL (ref 0.00–0.40)
Total Protein: 6.4 g/dL (ref 6.0–8.5)

## 2015-06-23 LAB — VITAMIN D 25 HYDROXY (VIT D DEFICIENCY, FRACTURES): VIT D 25 HYDROXY: 32.4 ng/mL (ref 30.0–100.0)

## 2015-06-23 LAB — MICROALBUMIN / CREATININE URINE RATIO
Creatinine, Urine: 111 mg/dL
MICROALB/CREAT RATIO: 194.7 mg/g{creat} — AB (ref 0.0–30.0)
MICROALBUM., U, RANDOM: 216.1 ug/mL

## 2015-06-23 LAB — BMP8+EGFR
BUN/Creatinine Ratio: 18 (ref 11–26)
BUN: 13 mg/dL (ref 8–27)
CO2: 20 mmol/L (ref 18–29)
Calcium: 9.4 mg/dL (ref 8.7–10.3)
Chloride: 99 mmol/L (ref 97–106)
Creatinine, Ser: 0.73 mg/dL (ref 0.57–1.00)
GFR calc Af Amer: 99 mL/min/{1.73_m2} (ref >59)
GFR, EST NON AFRICAN AMERICAN: 85 mL/min/{1.73_m2} (ref >59)
GLUCOSE: 198 mg/dL — AB (ref 65–99)
POTASSIUM: 3.7 mmol/L (ref 3.5–5.2)
Sodium: 137 mmol/L (ref 136–144)

## 2015-07-09 ENCOUNTER — Ambulatory Visit (INDEPENDENT_AMBULATORY_CARE_PROVIDER_SITE_OTHER): Payer: Medicare PPO | Admitting: Pharmacist

## 2015-07-09 ENCOUNTER — Encounter: Payer: Self-pay | Admitting: Pharmacist

## 2015-07-09 VITALS — BP 120/78 | HR 80 | Ht 63.0 in | Wt 181.0 lb

## 2015-07-09 DIAGNOSIS — E1129 Type 2 diabetes mellitus with other diabetic kidney complication: Secondary | ICD-10-CM | POA: Diagnosis not present

## 2015-07-09 DIAGNOSIS — E669 Obesity, unspecified: Secondary | ICD-10-CM

## 2015-07-09 DIAGNOSIS — E785 Hyperlipidemia, unspecified: Secondary | ICD-10-CM | POA: Diagnosis not present

## 2015-07-09 DIAGNOSIS — R809 Proteinuria, unspecified: Secondary | ICD-10-CM

## 2015-07-09 MED ORDER — CANAGLIFLOZIN 300 MG PO TABS: 300.0000 mg | ORAL_TABLET | Freq: Every day | ORAL | Status: DC

## 2015-07-09 NOTE — Progress Notes (Signed)
Patient ID: STACEYANN BORRESON, female   DOB: 12-Jun-1946, 69 y.o.   MRN: [id number] Subjective:    Anthonella ADELEIGH HOSAKA is a 69 y.o. female who presents for evaluation of Type 2 diabetes mellitus and diabetes education. The patient was initially diagnosed with Type 2 diabetes mellitus 3-4 years ago Current symptoms/problems include hyperglycemia and have been Improving over the last 2 weeks since she restarted Invokana.   Current diabetic medications include:  Invoakana 100mg  - take 1 tablet daily She has tried metformin and metformin XR in past and had worsening of loose stools, gast and bloating. Patient was in Medicare coverage gap and Invokana was too expensive she has missed several weeks of Invokana and simvastatin prior to labs.  She has since restarted both of these.  Known diabetic complications: mircoalbuminuria Cardiovascular risk factors: advanced age (older than 68 for men, 24 for women), diabetes mellitus, dyslipidemia, family history of premature cardiovascular disease, hypertension and obesity (BMI >= 30 kg/m2)  Eye exam current (within one year): yes Weight trend: stable Prior visit with dietician: no Current diet: in general, a "healthy" diet  , but some moments of indescretion.  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  Current monitoring regimen: home blood tests - one time daily Home blood sugar records: fasting range: 140 - 170.  She states that when she did not have Invokana was  Any episodes of hypoglycemia? no  Is She on ACE inhibitor or angiotensin II receptor blocker?  Yes  lisinopril (Prinivil) 5mg  daily    The following portions of the patient's history were reviewed and updated as appropriate: allergies, current medications, past family history, past medical history, past social history, past surgical history and problem list.  Review of Systems Pertinent items are noted in HPI.     Objective:    BP 120/78 mmHg  Pulse 80  Ht 5\' 3"  (1.6 m)  Wt 181 lb (82.101 kg)  BMI 32.07 kg/m2  General:  alert, cooperative, appears stated age, mild distress and mildly obese  Oropharynx: lips, mucosa, and tongue normal; teeth and gums normal   Lab Review GLUCOSE (mg/dL)  Date Value  06/22/2015 198*  01/27/2015 149*  09/21/2014 141*   GLUCOSE, BLD (mg/dL)  Date Value  11/11/2012 90   CO2 (mmol/L)  Date Value  06/22/2015 20  01/27/2015 21  09/21/2014 19   BUN (mg/dL)  Date Value  06/22/2015 13  01/27/2015 15  09/21/2014 8  11/11/2012 16   CREAT (mg/dL)  Date Value  11/11/2012 0.87   CREATININE, SER (mg/dL)  Date Value  06/22/2015 0.73  01/27/2015 0.87  09/21/2014 0.82   A1c = 7.9% (06/22/2015)  Assessment:    Diabetes Mellitus type II, under fair control.  Hyperlipidemia - LDL has increased due to not taking statin Microabluminuria Obesity   Plan:    1.  Rx changes: Increase Invokana to 300mg  - take 1 tablet daily (should help with BG and weight) 2.  Education: Reviewed 'ABCs' of diabetes management (respective goals in parentheses):  A1C (<7), blood pressure (<130/80), and cholesterol (LDL <100).  Recheck urine microalbuminuria in 3 to 6 months. 3.  Compliance at present is estimated to be good.  4.  Ddietary modifications:  Increase non-starchy vegetables - carrots, green bean, squash, zucchini, tomatoes, onions, peppers, spinach and other green leafy vegetables, cabbage, lettuce, cucumbers, asparagus, okra (not fried), eggplant limit sugar  and processed foods (cakes, cookies, ice cream, crackers and chips) Increase fresh fruit but limit serving sizes 1/2 cup or about the size of tennis or baseball limit red meat to now more than 1-2 times per week (serving size about the size of your palm)  Choose whole grains / leans proteins - whole wheat bread, quinoa, whole grain rice (1/2 cup), fish, chicken, [place] 5.   increased exercise  6.  Continue  to check BG at least daily 7.   Follow up: 1 month    Cherre Robins, PharmD, CPP, CDE

## 2015-07-15 ENCOUNTER — Other Ambulatory Visit: Payer: Self-pay | Admitting: Family Medicine

## 2015-07-15 NOTE — Telephone Encounter (Signed)
Last seen 06/22/15  Dr Laurance Flatten

## 2015-08-02 ENCOUNTER — Other Ambulatory Visit: Payer: Self-pay | Admitting: Family Medicine

## 2015-08-03 NOTE — Telephone Encounter (Signed)
Last seen and last Vit D 06/22/15  DWM   32.4

## 2015-08-09 ENCOUNTER — Other Ambulatory Visit: Payer: Self-pay | Admitting: Pharmacist

## 2015-08-11 ENCOUNTER — Other Ambulatory Visit: Payer: Self-pay | Admitting: Pharmacist

## 2015-08-18 ENCOUNTER — Other Ambulatory Visit: Payer: Self-pay | Admitting: Family Medicine

## 2015-08-20 ENCOUNTER — Ambulatory Visit (INDEPENDENT_AMBULATORY_CARE_PROVIDER_SITE_OTHER): Payer: Medicare PPO | Admitting: Pharmacist

## 2015-08-20 ENCOUNTER — Encounter: Payer: Self-pay | Admitting: Pharmacist

## 2015-08-20 VITALS — BP 120/66 | HR 84 | Ht 63.5 in | Wt 179.0 lb

## 2015-08-20 DIAGNOSIS — Z23 Encounter for immunization: Secondary | ICD-10-CM

## 2015-08-20 DIAGNOSIS — Z0000 Encounter for general adult medical examination without abnormal findings: Secondary | ICD-10-CM

## 2015-08-20 NOTE — Patient Instructions (Addendum)
Theresa Wilson , Thank you for taking time to come for your Medicare Wellness Visit. I appreciate your ongoing commitment to your health goals. Please review the following plan we discussed and let me know if I can assist you in the future.   These are the goals we discussed: Increase physical activity - goal is 150 minutes per week if able  Increase non-starchy vegetables - carrots, green bean, squash, zucchini, tomatoes, onions, peppers, spinach and other green leafy vegetables, cabbage, lettuce, cucumbers, asparagus, okra (not fried), eggplant limit sugar and processed foods (cakes, cookies, ice cream, crackers and chips) Increase fresh fruit but limit serving sizes 1/2 cup or about the size of tennis or baseball limit red meat to no more than 1-2 times per week (serving size about the size of your palm) Choose whole grains / lean proteins - whole wheat bread, quinoa, whole grain rice (1/2 cup), fish, chicken, [place]  Mammogram appointment - Monday, April 24th at 2:45pm   Pneumococcal Polysaccharide Vaccine: What You Need to Know 1. Why get vaccinated? Vaccination can protect older adults (and some children and younger adults) from pneumococcal disease. Pneumococcal disease is caused by bacteria that can spread from person to person through close contact. It can cause ear infections, and it can also lead to more serious infections of the:   Lungs (pneumonia),  Blood (bacteremia), and  Covering of the brain and spinal cord (meningitis). Meningitis can cause deafness and brain damage, and it can be fatal. Anyone can get pneumococcal disease, but children under 79 years of age, people with certain medical conditions, adults over 51 years of age, and cigarette smokers are at the highest risk. About 18,000 older adults die each year from pneumococcal disease in the [place]. Treatment of pneumococcal infections with penicillin and other drugs used to be more effective. But some strains of  the disease have become resistant to these drugs. This makes prevention of the disease, through vaccination, even more important. 2. Pneumococcal polysaccharide vaccine (PPSV23) Pneumococcal polysaccharide vaccine (PPSV23) protects against 23 types of pneumococcal bacteria. It will not prevent all pneumococcal disease. PPSV23 is recommended for:  All adults 48 years of age and older,  Anyone 2 through 68 years of age with certain long-term health problems,  Anyone 2 through 68 years of age with a weakened immune system,  Adults 45 through 68 years of age who smoke cigarettes or have asthma. Most people need only one dose of PPSV. A second dose is recommended for certain high-risk groups. People 35 and older should get a dose even if they have gotten one or more doses of the vaccine before they turned 65. Your healthcare provider can give you more information about these recommendations. Most healthy adults develop protection within 2 to 3 weeks of getting the shot. 3. Some people should not get this vaccine  Anyone who has had a life-threatening allergic reaction to PPSV should not get another dose.  Anyone who has a severe allergy to any component of PPSV should not receive it. Tell your provider if you have any severe allergies.  Anyone who is moderately or severely ill when the shot is scheduled may be asked to wait until they recover before getting the vaccine. Someone with a mild illness can usually be vaccinated.  Children less than 81 years of age should not receive this vaccine.  There is no evidence that PPSV is harmful to either a pregnant woman or to her fetus. However, as a precaution, women who  need the vaccine should be vaccinated before becoming pregnant, if possible. 4. Risks of a vaccine reaction With any medicine, including vaccines, there is a chance of side effects. These are usually mild and go away on their own, but serious reactions are also possible. About half of  people who get PPSV have mild side effects, such as redness or pain where the shot is given, which go away within about two days. Less than 1 out of 100 people develop a fever, muscle aches, or more severe local reactions. Problems that could happen after any vaccine:  People sometimes faint after a medical procedure, including vaccination. Sitting or lying down for about 15 minutes can help prevent fainting, and injuries caused by a fall. Tell your doctor if you feel dizzy, or have vision changes or ringing in the ears.  Some people get severe pain in the shoulder and have difficulty moving the arm where a shot was given. This happens very rarely.  Any medication can cause a severe allergic reaction. Such reactions from a vaccine are very rare, estimated at about 1 in a million doses, and would happen within a few minutes to a few hours after the vaccination. As with any medicine, there is a very remote chance of a vaccine causing a serious injury or death. The safety of vaccines is always being monitored. For more information, visit: http://www.aguilar.org/ 5. What if there is a serious reaction? What should I look for? Look for anything that concerns you, such as signs of a severe allergic reaction, very high fever, or unusual behavior.  Signs of a severe allergic reaction can include hives, swelling of the face and throat, difficulty breathing, a fast heartbeat, dizziness, and weakness. These would usually start a few minutes to a few hours after the vaccination. What should I do? If you think it is a severe allergic reaction or other emergency that can't wait, call 9-1-1 or get to the nearest hospital. Otherwise, call your doctor. Afterward, the reaction should be reported to the Vaccine Adverse Event Reporting System (VAERS). Your doctor might file this report, or you can do it yourself through the VAERS web site at www.vaers.SamedayNews.es, or by calling 8432328899.  VAERS does not give  medical advice. 6. How can I learn more?  Ask your doctor. He or she can give you the vaccine package insert or suggest other sources of information.  Call your local or state health department.  Contact the Centers for Disease Control and Prevention (CDC):  Call 825-319-1931 (1-800-CDC-INFO) or  Visit CDC's website at http://hunter.com/ CDC Pneumococcal Polysaccharide Vaccine VIS (11/14/13)   This information is not intended to replace advice given to you by your health care provider. Make sure you discuss any questions you have with your health care provider.   Document Released: 05/07/2006 Document Revised: 07/31/2014 Document Reviewed: 11/17/2013 Elsevier Interactive Patient Education [employer].  This is a list of the screening recommended for you and due dates:  Health Maintenance  Topic Date Due  . Pneumonia vaccines (2 of 2 - PPSV23) Completed today  .  Hepatitis C: One time screening is recommended by Center for Disease Control  (CDC) for  adults born from [birth year] through 1963.   02/19/2016 - will have checked 10/2015  . Eye exam for diabetics  11/10/2015  . Hemoglobin A1C  12/20/2015  . Flu Shot  02/22/2016  . Mammogram  Due now  . Pap Smear  05/29/2016  . Complete foot exam   06/21/2016  .  Tetanus Vaccine  07/24/2017  . Colon Cancer Screening  07/24/2018  . DEXA scan (bone density measurement)  Completed  . Shingles Vaccine  Completed  *Topic was postponed. The date shown is not the original due date.

## 2015-08-20 NOTE — Progress Notes (Signed)
Patient ID: Theresa Wilson, female   DOB: [birth year], 70 y.o.   MRN: [id number]    Subjective:   Cystal Theresa Wilson is a 70 y.o. white, female who presents for a subsequent Medicare Annual Wellness Visit and to recheck BG.   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  Patient is concerned because although [employer]astasio Auerbach is tier 3 on her insurance she much pay a deductible of $400 and she cannot afford this.    HBG readings: range from 125 - 208 7 day avg = 154 14 day avg = 146 30 day avg = 152 90 day avg = 174  Review of Systems  Review of Systems  Constitutional: Negative.   HENT: Negative.   Eyes: Negative.   Respiratory: Negative.   Cardiovascular: Negative.   Gastrointestinal: Negative.   Genitourinary: Negative.   Musculoskeletal: Negative.   Skin: Negative.   Neurological: Negative.   Endo/Heme/Allergies: Negative.   Psychiatric/Behavioral: Negative.      Current Medications (verified) Outpatient Encounter Prescriptions as of 08/20/2015  Medication Sig  . ACCU-CHEK AVIVA PLUS test strip TEST BLOOD SUGAR ONCE DAILY TO TWICE DAILY  . acetaminophen (TYLENOL) 500 MG tablet Take 500 mg by mouth every 6 (six) hours as needed. Reported on 07/09/2015  . aspirin 81 MG EC tablet Take 1 tablet (81 mg total) by mouth daily. Swallow whole.  . Blood Glucose Monitoring Suppl (ACCU-CHEK AVIVA) device Use to check blood sugar bid. Dx E11.9  . fluticasone (FLONASE) 50 MCG/ACT nasal spray Place 1 spray into both nostrils daily.  Marland Kitchen ibuprofen (ADVIL,MOTRIN) 200 MG tablet Take 200 mg by mouth every 8 (eight) hours as needed.  . INVOKANA 300 MG TABS tablet TAKE 1 TABLET EVERY DAY BEFORE BREAKFAST  . Lancets (ACCU-CHEK SOFT TOUCH) lancets Test blood sugar bid. Dx E11.9  . lisinopril (PRINIVIL,ZESTRIL) 5 MG tablet TAKE 1 TABLET EVERY DAY  . pramipexole (MIRAPEX) 0.25 MG tablet TAKE 1 TABLET AT BEDTIME  . ranitidine (ZANTAC) 150 MG tablet TAKE 1 TABLET TWICE  DAILY  . simvastatin (ZOCOR) 20 MG tablet TAKE ONE TABLET BY MOUTH DAILY IN THE EVENING  . traZODone (DESYREL) 50 MG tablet TAKE 1 TABLET ONE TIME DAILY AT BEDTIME  . Vitamin D, Ergocalciferol, (DRISDOL) 50000 units CAPS capsule TAKE 1 CAPSULE EVERY 7 DAYS.   No facility-administered encounter medications on file as of 08/20/2015.    Allergies (verified) Hydrocodone-homatropine; Rosuvastatin; and Sulfa antibiotics   History: Past Medical History  Diagnosis Date  . Diabetes mellitus without complication ([place])   . Hypertension   . Hyperlipidemia   . Restless leg syndrome   . Cataract   . Allergy    Past Surgical History  Procedure Laterality Date  . Ovarian cyst removal     Family History  Problem Relation Age of Onset  . Heart attack Mother 65  . Heart disease Mother   . Hypertension Mother   . Hypertension Father   . Heart disease Father   . Congestive Heart Failure Father   . Cancer Sister     lung  . Hyperlipidemia Sister   . Hypertension Sister    Social History   Occupational History  . Not on file.   Social History Main Topics  . Smoking status: Former Smoker    Quit date: 11/11/2008  . Smokeless tobacco: Never Used  . Alcohol Use: No     Comment: occassional  . Drug Use: No  . Sexual  Activity: Not Currently    Do you feel safe at home?  Yes  Dietary issues and exercise activities: Current Exercise Habits:: The patient has a physically strenous job, but has no regular exercise apart from work.  Current Dietary habits:  Following CHO limiting diet  Objective:    Today's Vitals   08/20/15 1545  BP: 120/66  Pulse: 84  Height: 5' 3.5" (1.613 m)  Weight: 179 lb (81.194 kg)  PainSc: 0-No pain   Body mass index is 31.21 kg/(m^2).  Activities of Daily Living In your present state of health, do you have any difficulty performing the following activities: 08/20/2015  Hearing? N  Vision? Y  Difficulty concentrating or making decisions? N  Walking  or climbing stairs? N  Dressing or bathing? N  Doing errands, shopping? N  Preparing Food and eating ? N  Using the Toilet? N  In the past six months, have you accidently leaked urine? N  Do you have problems with loss of bowel control? N  Managing your Medications? N  Managing your Finances? Y  Housekeeping or managing your Housekeeping? N    Are there smokers in your home (other than you)? No   Cardiac Risk Factors include: advanced age (>61men, >6 women);diabetes mellitus;dyslipidemia;family history of premature cardiovascular disease;hypertension;microalbuminuria;obesity (BMI >30kg/m2);sedentary lifestyle  Depression Screen PHQ 2/9 Scores 08/20/2015 06/22/2015 01/27/2015 09/21/2014  PHQ - 2 Score 0 0 0 1    Fall Risk Fall Risk  08/20/2015 06/22/2015 01/27/2015 09/21/2014 07/31/2014  Falls in the past year? No No No No No    Cognitive Function: MMSE - Mini Mental State Exam 08/20/2015  Orientation to time 5  Orientation to Place 5  Registration 3  Attention/ Calculation 4  Recall 3  Language- name 2 objects 2  Language- repeat 1  Language- follow 3 step command 3  Language- read & follow direction 1  Write a sentence 1  Copy design 1  Total score 29    Immunizations and Health Maintenance Immunization History  Administered Date(s) Administered  . Influenza,inj,Quad PF,36+ Mos 04/22/2014  . Influenza-Unspecified 04/23/2013  . Pneumococcal Conjugate-13 07/15/2013  . Pneumococcal Polysaccharide-23 08/20/2015   There are no preventive care reminders to display for this patient.  Patient Care Team: Chipper Herb, MD as PCP - General (Family Medicine)  Indicate any recent Medical Services you may have received from other than [facility] providers in the past year (date may be approximate).    Assessment:    Annual Wellness Visit    Screening Tests Health Maintenance  Topic Date Due  . PNA vac Low Risk Adult (2 of 2 - PPSV23) 09/21/2015 (Originally 07/15/2014)  .  Hepatitis C Screening  02/19/2016 (Originally 1945/10/07)  . OPHTHALMOLOGY EXAM  11/10/2015  . HEMOGLOBIN A1C  12/20/2015  . INFLUENZA VACCINE  02/22/2016  . MAMMOGRAM  04/22/2016  . PAP SMEAR  05/29/2016  . FOOT EXAM  06/21/2016  . TETANUS/TDAP  07/24/2017  . COLONOSCOPY  07/24/2018  . DEXA SCAN  Completed  . ZOSTAVAX  Completed        Plan:   During the course of the visit Keriana was educated and counseled about the following appropriate screening and preventive services:   Received pneumovax 23 vaccines today  UTD on zostavax, influenza and Prevnar 13  Colorectal cancer screening - colonoscopy UTD; patient has FOBT at home but has not returned to clinic yet.   Cardiovascular disease screening - recommend consider EKG at next appt with PCP  Diabetes -  patient given sample for Jardiance 25mg  take 1 tablet daily.  Will try to continue with samples until next A1c.    Continue to check BG daily  Bone Denisty / Osteoporosis Screening - UTD  Mammogram - appt made today for mammogram in April 2017  Glaucoma screening / Diabetic Eye Exam - UTD  Nutrition counseling - continue to limit CHO   Advanced Directives - declined  Increase physical activity - start with 15 to 20 minutes daily and work up to 150 minutes per week.   Past history of smoking and sister with lung cancer.  Last chest CT was 2015.  Will order repeat.  Orders Placed This Encounter  Procedures  . Pneumococcal polysaccharide vaccine 23-valent greater than or equal to 2yo subcutaneous/IM     Patient Instructions (the written plan) were given to the patient.   Cherre Robins, [facility]   08/20/2015

## 2015-09-08 ENCOUNTER — Other Ambulatory Visit: Payer: Self-pay | Admitting: Pharmacist

## 2015-09-08 DIAGNOSIS — Z801 Family history of malignant neoplasm of trachea, bronchus and lung: Secondary | ICD-10-CM

## 2015-09-08 DIAGNOSIS — Z87891 Personal history of nicotine dependence: Secondary | ICD-10-CM

## 2015-09-14 ENCOUNTER — Other Ambulatory Visit: Payer: Self-pay

## 2015-09-14 DIAGNOSIS — Z87891 Personal history of nicotine dependence: Secondary | ICD-10-CM

## 2015-09-17 ENCOUNTER — Ambulatory Visit (HOSPITAL_COMMUNITY)
Admission: RE | Admit: 2015-09-17 | Discharge: 2015-09-17 | Disposition: A | Discharge status: Home / Self Care | Payer: Medicare PPO | Source: Ambulatory Visit | Attending: Family Medicine | Admitting: Family Medicine

## 2015-09-17 DIAGNOSIS — Z87891 Personal history of nicotine dependence: Secondary | ICD-10-CM | POA: Diagnosis not present

## 2015-09-17 DIAGNOSIS — R918 Other nonspecific abnormal finding of lung field: Secondary | ICD-10-CM | POA: Diagnosis not present

## 2015-10-04 ENCOUNTER — Telehealth: Payer: Self-pay | Admitting: Family Medicine

## 2015-10-11 MED ORDER — CANAGLIFLOZIN 300 MG PO TABS: 300.0000 mg | ORAL_TABLET | Freq: Every day | ORAL | Status: DC

## 2015-10-11 NOTE — Telephone Encounter (Signed)
Left #30 samples of invokana 300mg  for patient - patient notified.

## 2015-11-03 ENCOUNTER — Ambulatory Visit: Payer: Self-pay | Admitting: Family Medicine

## 2015-11-15 ENCOUNTER — Encounter: Payer: Medicare PPO | Admitting: *Deleted

## 2015-11-15 ENCOUNTER — Encounter: Payer: Self-pay | Admitting: Family Medicine

## 2015-11-15 ENCOUNTER — Other Ambulatory Visit: Payer: Self-pay | Admitting: *Deleted

## 2015-11-15 ENCOUNTER — Ambulatory Visit (INDEPENDENT_AMBULATORY_CARE_PROVIDER_SITE_OTHER): Payer: Medicare PPO | Admitting: Family Medicine

## 2015-11-15 VITALS — BP 121/72 | HR 88 | Temp 97.6°F | Ht 63.5 in | Wt 177.0 lb

## 2015-11-15 DIAGNOSIS — M7550 Bursitis of unspecified shoulder: Secondary | ICD-10-CM

## 2015-11-15 DIAGNOSIS — R809 Proteinuria, unspecified: Secondary | ICD-10-CM | POA: Diagnosis not present

## 2015-11-15 DIAGNOSIS — K219 Gastro-esophageal reflux disease without esophagitis: Secondary | ICD-10-CM

## 2015-11-15 DIAGNOSIS — R918 Other nonspecific abnormal finding of lung field: Secondary | ICD-10-CM | POA: Diagnosis not present

## 2015-11-15 DIAGNOSIS — Z1211 Encounter for screening for malignant neoplasm of colon: Secondary | ICD-10-CM | POA: Diagnosis not present

## 2015-11-15 DIAGNOSIS — M7158 Other bursitis, not elsewhere classified, other site: Secondary | ICD-10-CM

## 2015-11-15 DIAGNOSIS — Z1231 Encounter for screening mammogram for malignant neoplasm of breast: Secondary | ICD-10-CM | POA: Diagnosis not present

## 2015-11-15 DIAGNOSIS — I728 Aneurysm of other specified arteries: Secondary | ICD-10-CM

## 2015-11-15 DIAGNOSIS — E1129 Type 2 diabetes mellitus with other diabetic kidney complication: Secondary | ICD-10-CM

## 2015-11-15 DIAGNOSIS — E559 Vitamin D deficiency, unspecified: Secondary | ICD-10-CM | POA: Diagnosis not present

## 2015-11-15 DIAGNOSIS — I10 Essential (primary) hypertension: Secondary | ICD-10-CM

## 2015-11-15 DIAGNOSIS — E785 Hyperlipidemia, unspecified: Secondary | ICD-10-CM

## 2015-11-15 LAB — BAYER DCA HB A1C WAIVED: HB A1C (BAYER DCA - WAIVED): 8.1 % — ABNORMAL HIGH (ref <7.0)

## 2015-11-15 LAB — HM MAMMOGRAPHY

## 2015-11-15 MED ORDER — VITAMIN D (ERGOCALCIFEROL) 1.25 MG (50000 UNIT) PO CAPS: ORAL_CAPSULE | ORAL | Status: DC

## 2015-11-15 NOTE — Patient Instructions (Addendum)
Medicare Annual Wellness Visit  [facility] and the medical providers at [facility] strive to bring you the best medical care.  In doing so we not only want to address your current medical conditions and concerns but also to detect new conditions early and prevent illness, disease and health-related problems.    Medicare offers a yearly Wellness Visit which allows our clinical staff to assess your need for preventative services including immunizations, lifestyle education, counseling to decrease risk of preventable diseases and screening for fall risk and other medical concerns.    This visit is provided free of charge (no copay) for all Medicare recipients. The clinical pharmacists at [facility] have begun to conduct these Wellness Visits which will also include a thorough review of all your medications.    As you primary medical provider recommend that you make an appointment for your Annual Wellness Visit if you have not done so already this year.  You may set up this appointment before you leave today or you may call back [phone] and schedule an appointment.  Please make sure when you call that you mention that you are scheduling your Annual Wellness Visit with the clinical pharmacist so that the appointment may be made for the proper length of time.     Continue current medications. Continue good therapeutic lifestyle changes which include good diet and exercise. Fall precautions discussed with patient. If an FOBT was given today- please return it to our front desk. If you are over 68 years old - you may need Prevnar 42 or the adult Pneumonia vaccine.  **Flu shots are available--- please call and schedule a FLU-CLINIC appointment**  After your visit with [place] today you will receive a survey in the mail or online from [employer] regarding your care with [place]. Please take a moment to fill this out. Your feedback is very  important to [place] as you can help [place] better understand your patient needs as well as improve your experience and satisfaction. WE CARE ABOUT YOU!!!   The patient has plans to get her eye exam and she should have the people checking her eyes to send [place] a copy of the report She should take Aleve over-the-counter for the next week after breakfast and supper and use warm wet compresses to the right medial shoulder area 20 minutes 3 or 4 times daily She should avoid heavy lifting and repetitive action with the shoulder to allow it to have time to heal Continue to watch diet closely and monitor blood sugars closely. The recent CT scan of the lung was reviewed and you'll need to have another one in February 2018. There is no comment on this particular CT scan about the splenic artery aneurysm and we will check with the radiologist about this. We will have Tammy call and address the medication issue with Invokana,

## 2015-11-15 NOTE — Progress Notes (Signed)
Subjective:    Patient ID: Theresa Wilson, female    DOB: Apr 29, 1946, 70 y.o.   MRN: [id number]  HPI Pt here for follow up and management of chronic medical problems which includes diabetes and hyperlipidemia. She is taking medications regularly.She is complaining of some right shoulder blade pain. She is requesting refills on her vitamin D. She'll be given an FOBT to return. She will get lab work today. Most likely we can change her Invokana to a different blood sugar pill that her insurance will pay for. 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 She is no longer doing that job and her shoulder is feeling better. She denies any chest pain or shortness of breath. She is having minimal heartburn and she is taking ranitidine 150 twice daily. She takes ibuprofen when necessary. She is swallowing her food without problems. She is having no problems with passing her water burning pain or frequency. She has not seen any blood in the stool or had any black tarry bowel movements. Her blood sugars at home and been running in the 120 to 1:30 range fasting.      Patient Active Problem List   Diagnosis Date Noted  . Urine test positive for microalbuminuria 07/09/2015  . Sleep apnea 01/27/2015  . Splenic artery aneurysm ([place]) 04/14/2014  . Arthralgia of hands, bilateral 01/21/2014  . Trigger finger of left hand 01/21/2014  . Vitamin D deficiency 01/21/2014  . HTN (hypertension) 11/11/2012  . HLD (hyperlipidemia) 11/11/2012  . Type 2 diabetes mellitus ([place]) 11/11/2012  . Restless legs syndrome 11/11/2012  . GERD (gastroesophageal reflux disease) 11/11/2012   Outpatient Encounter Prescriptions as of 11/15/2015  Medication Sig  . ACCU-CHEK AVIVA PLUS test strip TEST BLOOD SUGAR ONCE DAILY TO TWICE DAILY  . acetaminophen  (TYLENOL) 500 MG tablet Take 500 mg by mouth every 6 (six) hours as needed. Reported on 07/09/2015  . aspirin 81 MG EC tablet Take 1 tablet (81 mg total) by mouth daily. Swallow whole.  . Blood Glucose Monitoring Suppl (ACCU-CHEK AVIVA) device Use to check blood sugar bid. Dx E11.9  . canagliflozin (INVOKANA) 300 MG TABS tablet Take 1 tablet (300 mg total) by mouth daily before breakfast.  . fluticasone (FLONASE) 50 MCG/ACT nasal spray Place 1 spray into both nostrils daily.  Marland Kitchen ibuprofen (ADVIL,MOTRIN) 200 MG tablet Take 200 mg by mouth every 8 (eight) hours as needed.  . Lancets (ACCU-CHEK SOFT TOUCH) lancets Test blood sugar bid. Dx E11.9  . lisinopril (PRINIVIL,ZESTRIL) 5 MG tablet TAKE 1 TABLET EVERY DAY  . pramipexole (MIRAPEX) 0.25 MG tablet TAKE 1 TABLET AT BEDTIME  . ranitidine (ZANTAC) 150 MG tablet TAKE 1 TABLET TWICE DAILY  . simvastatin (ZOCOR) 20 MG tablet TAKE ONE TABLET BY MOUTH DAILY IN THE EVENING  . traZODone (DESYREL) 50 MG tablet TAKE 1 TABLET ONE TIME DAILY AT BEDTIME  . Vitamin D, Ergocalciferol, (DRISDOL) 50000 units CAPS capsule TAKE 1 CAPSULE EVERY 7 DAYS.  . [DISCONTINUED] Vitamin D, Ergocalciferol, (DRISDOL) 50000 units CAPS capsule TAKE 1 CAPSULE EVERY 7 DAYS.   No facility-administered encounter medications on file as of 11/15/2015.     Review of Systems  Constitutional: Negative.   HENT: Negative.   Eyes: Negative.   Respiratory: Negative.   Cardiovascular: Negative.   Gastrointestinal: Negative.  Endocrine: Negative.   Genitourinary: Negative.   Musculoskeletal: Positive for arthralgias (right side shoulder blade pain).  Skin: Negative.   Allergic/Immunologic: Negative.   Neurological: Negative.   Hematological: Negative.   Psychiatric/Behavioral: Negative.        Objective:   Physical Exam  Constitutional: She is oriented to person, place, and time. She appears well-developed and well-nourished. No distress.  HENT:  Head: Normocephalic and  atraumatic.  Right Ear: External ear normal.  Left Ear: External ear normal.  Mouth/Throat: Oropharynx is clear and moist.  Nasal congestion and turbinate swelling bilaterally and slight redness in the throat posteriorly.  Eyes: Conjunctivae and EOM are normal. Pupils are equal, round, and reactive to light. Right eye exhibits no discharge. Left eye exhibits no discharge. No scleral icterus.  Neck: Normal range of motion. Neck supple. No thyromegaly present.  No anterior cervical adenopathy  Cardiovascular: Normal rate, regular rhythm, normal heart sounds and intact distal pulses.   No murmur heard. The heart is regular at 72/m  Pulmonary/Chest: Effort normal and breath sounds normal. No respiratory distress. She has no wheezes. She has no rales. She exhibits no tenderness.  Clear anteriorly and posteriorly  Abdominal: Soft. Bowel sounds are normal. She exhibits no mass. There is no tenderness. There is no rebound and no guarding.  No abdominal tenderness liver or spleen enlargement  Musculoskeletal: Normal range of motion. She exhibits tenderness. She exhibits no edema.  There is tenderness along the medial border of the right scapula.  Lymphadenopathy:    She has no cervical adenopathy.  Neurological: She is alert and oriented to person, place, and time. She has normal reflexes. No cranial nerve deficit.  Skin: Skin is warm and dry. No rash noted.  Psychiatric: She has a normal mood and affect. Her behavior is normal. Judgment and thought content normal.  Nursing note and vitals reviewed.  BP 121/72 mmHg  Pulse 88  Temp(Src) 97.6 F (36.4 C) (Oral)  Ht 5' 3.5" (1.613 m)  Wt 177 lb (80.287 kg)  BMI 30.86 kg/m2        Assessment & Plan:  1. Type 2 diabetes mellitus with microalbuminuria, without long-term current use of insulin (HCC) -Home blood sugars have been running in the 120 to 1:30 range fasting. We will try to find a cheaper alternative to Invokana and we'll call the  patient with that option. She should continue with aggressive therapeutic lifestyle changes which include diet and exercise and check her blood sugars regularly. - Bayer DCA Hb A1c Waived - CBC with Differential/Platelet  2. HLD (hyperlipidemia) -Continue with current treatment pending results of lab work - CBC with Differential/Platelet - Lipid panel  3. Essential hypertension -The  blood pressure is good today and she should continue with current treatment - BMP8+EGFR - CBC with Differential/Platelet - Hepatic function panel  4. Vitamin D deficiency -Continue with vitamin D replacement pending results of lab work - CBC with Differential/Platelet - VITAMIN D 25 Hydroxy (Vit-D Deficiency, Fractures)  5. Gastroesophageal reflux disease, esophagitis presence not specified -Continue ranitidine [phone] milligrams twice daily as needed to protect stomach especially if taking NSAID for shoulder pain - CBC with Differential/Platelet - Hepatic function panel  6. Special screening for malignant neoplasms, colon -Return the FOBT - Fecal occult blood, imunochemical; Future  7. Splenic artery aneurysm [facility]) -The patient had a recent CT scan of her chest because of cigarette smoking and the radiologist recommended another low-dose noncontrast CT scan in one year or in February 2018.  There was no comment on the CT scan about the previous splenic artery aneurysm that was noted on the previous CT scan. We will call the radiologist and see if they didn't note anything on this one that would mean any further follow-up.  8. Subscapular bursitis -Use warm wet compresses or moist heat 20 minutes 3 or 4 times daily and take Aleve 1 twice daily after breakfast and supper for the next 7-10 days. Try to avoid heavy lifting pushing pulling and repetitive activity  . Patient Instructions                       Medicare Annual Wellness Visit  [facility] and the medical providers at [facility] strive to bring you the best medical care.  In doing so we not only want to address your current medical conditions and concerns but also to detect new conditions early and prevent illness, disease and health-related problems.    Medicare offers a yearly Wellness Visit which allows our clinical staff to assess your need for preventative services including immunizations, lifestyle education, counseling to decrease risk of preventable diseases and screening for fall risk and other medical concerns.    This visit is provided free of charge (no copay) for all Medicare recipients. The clinical pharmacists at [facility] have begun to conduct these Wellness Visits which will also include a thorough review of all your medications.    As you primary medical provider recommend that you make an appointment for your Annual Wellness Visit if you have not done so already this year.  You may set up this appointment before you leave today or you may call back [phone]) and schedule an appointment.  Please make sure when you call that you mention that you are scheduling your Annual Wellness Visit with the clinical pharmacist so that the appointment may be made for the proper length of time.     Continue current medications. Continue good therapeutic lifestyle changes which include good diet and exercise. Fall precautions discussed with patient. If an FOBT was given today- please return it to our front desk. If you are over 50 years old - you may need Prevnar 13 or the adult Pneumonia vaccine.  **Flu shots are available--- please call and schedule a FLU-CLINIC appointment**  After your visit with us today you will receive a survey in the mail or online from [employer] regarding your care with us. Please take a moment to fill this out. Your feedback is very important to us as you can help us better understand your patient needs as well as improve your experience and  satisfaction. WE CARE ABOUT YOU!!!   The patient has plans to get her eye exam and she should have the people checking her eyes to send us a copy of the report She should take Aleve over-the-counter for the next week after breakfast and supper and use warm wet compresses to the right medial shoulder area 20 minutes 3 or 4 times daily She should avoid heavy lifting and repetitive action with the shoulder to allow it to have time to heal Continue to watch diet closely and monitor blood sugars closely. The recent CT scan of the lung was reviewed and you'll need to have another one in February 2018. There is no comment on this particular CT scan about the splenic artery aneurysm and we will check with the radiologist about this. We will have Tammy call and address   the medication issue with Invokana,   Don W. Moore MD  

## 2015-11-15 NOTE — Progress Notes (Signed)
Pt states that her Invokana will be $500 / per #90 day supply = she cant afford that. She took her last sample of this last Wednesday.  What would you recommend switching her to.

## 2015-11-16 LAB — BMP8+EGFR
BUN / CREAT RATIO: 17 (ref 12–28)
BUN: 13 mg/dL (ref 8–27)
CO2: 21 mmol/L (ref 18–29)
CREATININE: 0.78 mg/dL (ref 0.57–1.00)
Calcium: 9.9 mg/dL (ref 8.7–10.3)
Chloride: 101 mmol/L (ref 96–106)
GFR calc non Af Amer: 79 mL/min/{1.73_m2} (ref >59)
GFR, EST AFRICAN AMERICAN: 91 mL/min/{1.73_m2} (ref >59)
Glucose: 133 mg/dL — ABNORMAL HIGH (ref 65–99)
Potassium: 3.7 mmol/L (ref 3.5–5.2)
Sodium: 140 mmol/L (ref 134–144)

## 2015-11-16 LAB — CBC WITH DIFFERENTIAL/PLATELET
BASOS ABS: 0 10*3/uL (ref 0.0–0.2)
Basos: 0 %
EOS (ABSOLUTE): 0.2 10*3/uL (ref 0.0–0.4)
Eos: 2 %
HEMATOCRIT: 42.7 % (ref 34.0–46.6)
HEMOGLOBIN: 14.7 g/dL (ref 11.1–15.9)
Immature Grans (Abs): 0 10*3/uL (ref 0.0–0.1)
Immature Granulocytes: 0 %
LYMPHS ABS: 2.1 10*3/uL (ref 0.7–3.1)
Lymphs: 25 %
MCH: 30.1 pg (ref 26.6–33.0)
MCHC: 34.4 g/dL (ref 31.5–35.7)
MCV: 87 fL (ref 79–97)
MONOCYTES: 6 %
MONOS ABS: 0.5 10*3/uL (ref 0.1–0.9)
Neutrophils Absolute: 5.6 10*3/uL (ref 1.4–7.0)
Neutrophils: 67 %
Platelets: 265 10*3/uL (ref 150–379)
RBC: 4.89 x10E6/uL (ref 3.77–5.28)
RDW: 14.2 % (ref 12.3–15.4)
WBC: 8.4 10*3/uL (ref 3.4–10.8)

## 2015-11-16 LAB — HEPATIC FUNCTION PANEL
ALBUMIN: 4.4 g/dL (ref 3.6–4.8)
ALK PHOS: 116 IU/L (ref 39–117)
ALT: 13 IU/L (ref 0–32)
AST: 16 IU/L (ref 0–40)
Bilirubin Total: 0.2 mg/dL (ref 0.0–1.2)
Bilirubin, Direct: 0.06 mg/dL (ref 0.00–0.40)
TOTAL PROTEIN: 6.7 g/dL (ref 6.0–8.5)

## 2015-11-16 LAB — LIPID PANEL
CHOLESTEROL TOTAL: 206 mg/dL — AB (ref 100–199)
Chol/HDL Ratio: 4.3 ratio units (ref 0.0–4.4)
HDL: 48 mg/dL (ref >39)
LDL Calculated: 97 mg/dL (ref 0–99)
TRIGLYCERIDES: 304 mg/dL — AB (ref 0–149)
VLDL Cholesterol Cal: 61 mg/dL — ABNORMAL HIGH (ref 5–40)

## 2015-11-16 LAB — VITAMIN D 25 HYDROXY (VIT D DEFICIENCY, FRACTURES): VIT D 25 HYDROXY: 25.9 ng/mL — AB (ref 30.0–100.0)

## 2015-11-17 NOTE — Progress Notes (Signed)
I have already discussed with patient.  She was given 30 day supply of samples on 11/15/2015 and is contacting me about letter she received from insurance stating that a GLP1 is covered (she thinks might be Trulicity but needs to find letter).

## 2015-11-24 ENCOUNTER — Encounter: Payer: Self-pay | Admitting: *Deleted

## 2015-11-26 ENCOUNTER — Other Ambulatory Visit: Payer: Self-pay | Admitting: Family Medicine

## 2015-11-30 ENCOUNTER — Other Ambulatory Visit: Payer: Self-pay | Admitting: Family Medicine

## 2015-12-02 ENCOUNTER — Encounter: Payer: Self-pay | Admitting: Pharmacist

## 2015-12-02 ENCOUNTER — Ambulatory Visit (INDEPENDENT_AMBULATORY_CARE_PROVIDER_SITE_OTHER): Payer: Medicare PPO | Admitting: Pharmacist

## 2015-12-02 VITALS — BP 114/70 | HR 77 | Ht 64.0 in | Wt 177.0 lb

## 2015-12-02 DIAGNOSIS — E1129 Type 2 diabetes mellitus with other diabetic kidney complication: Secondary | ICD-10-CM

## 2015-12-02 DIAGNOSIS — E785 Hyperlipidemia, unspecified: Secondary | ICD-10-CM

## 2015-12-02 DIAGNOSIS — R809 Proteinuria, unspecified: Secondary | ICD-10-CM | POA: Diagnosis not present

## 2015-12-02 MED ORDER — GLIMEPIRIDE 2 MG PO TABS: 2.0000 mg | ORAL_TABLET | Freq: Every day | ORAL | Status: DC

## 2015-12-02 NOTE — Patient Instructions (Signed)
Diabetes and Standards of Medical Care   Diabetes is complicated. You may find that your diabetes team includes a dietitian, nurse, diabetes educator, eye doctor, and more. To help everyone know what is going on and to help you get the care you deserve, the following schedule of care was developed to help keep you on track. Below are the tests, exams, vaccines, medicines, education, and plans you will need.  Blood Glucose Goals Prior to meals = 80 - 130 Within 2 hours of the start of a meal = less than 180  HbA1c test (goal is less than 7.0% - your last value was 8.1%) This test shows how well you have controlled your glucose over the past 2 to 3 months. It is used to see if your diabetes management plan needs to be adjusted.   It is performed at least 2 times a year if you are meeting treatment goals.  It is performed 4 times a year if therapy has changed or if you are not meeting treatment goals.  Blood pressure test  This test is performed at every routine medical visit. The goal is less than 140/90 mmHg for most people, but 130/80 mmHg in some cases. Ask your health care provider about your goal.  Dental exam  Follow up with the dentist regularly.  Eye exam  If you are diagnosed with type 1 diabetes as a child, get an exam upon reaching the age of 60 years or older and have had diabetes for 3 to 5 years. Yearly eye exams are recommended after that initial eye exam.  If you are diagnosed with type 1 diabetes as an adult, get an exam within 5 years of diagnosis and then yearly.  If you are diagnosed with type 2 diabetes, get an exam as soon as possible after the diagnosis and then yearly.  Foot care exam  Visual foot exams are performed at every routine medical visit. The exams check for cuts, injuries, or other problems with the feet.  A comprehensive foot exam should be done yearly. This includes visual inspection as well as assessing foot pulses and testing for loss of  sensation.  Check your feet nightly for cuts, injuries, or other problems with your feet. Tell your health care provider if anything is not healing.  Kidney function test (urine microalbumin)  This test is performed once a year.  Type 1 diabetes: The first test is performed 5 years after diagnosis.  Type 2 diabetes: The first test is performed at the time of diagnosis.  A serum creatinine and estimated glomerular filtration rate (eGFR) test is done once a year to assess the level of chronic kidney disease (CKD), if present.  Lipid profile (cholesterol, HDL, LDL, triglycerides)  Performed every 5 years for most people.  The goal for LDL is less than 100 mg/dL. If you are at high risk, the goal is less than 70 mg/dL.  The goal for HDL is 40 mg/dL to 50 mg/dL for men and 50 mg/dL to 60 mg/dL for women. An HDL cholesterol of 60 mg/dL or higher gives some protection against heart disease.  The goal for triglycerides is less than 150 mg/dL.  Influenza vaccine, pneumococcal vaccine, and hepatitis B vaccine  The influenza vaccine is recommended yearly.  The pneumococcal vaccine is generally given once in a lifetime. However, there are some instances when another vaccination is recommended. Check with your health care provider.  The hepatitis B vaccine is also recommended for adults with diabetes.  Diabetes self-management education  Education is recommended at diagnosis and ongoing as needed.  Treatment plan  Your treatment plan is reviewed at every medical visit.  Document Released: 05/07/2009 Document Revised: 03/12/2013 Document Reviewed: 12/10/2012 ExitCare Patient Information 2014 [employer].   

## 2015-12-02 NOTE — Progress Notes (Signed)
Patient ID: Theresa Wilson, female   DOB: [birth year], 70 y.o.   MRN: [id number] Subjective:    Theresa Wilson is a 70 y.o. female who presents for evaluation of Type 2 diabetes mellitus and diabetes education. The patient was initially diagnosed with Type 2 diabetes mellitus 4 years ago  Current diabetic medications include:  Invoakana 300mg  - take 1 tablet daily, however she states she is unable to afford because she must meet a $400 deductible on tier 3 meds. She currently has samples of Invokana.  She has tried metformin and metformin XR in past and had worsening of loose stools, gas and bloating.  Known diabetic complications: mircoalbuminuria Cardiovascular risk factors: advanced age (older than 62 for men, 59 for women), diabetes mellitus, dyslipidemia, family history of premature cardiovascular disease, hypertension and obesity (BMI >= 30 kg/m2)  Eye exam current (within one year): yes Weight trend: stable Prior visit with CDE: yes Current diet: in general, an "unhealthy" diet, tries to limit sugar and CHO intake.  She drinks 2 to 3 Dr Samson Frederic per day.  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  Current monitoring regimen: home blood tests - one time daily Home blood sugar records: fasting range: 130- 160. Only checks in am Any episodes of hypoglycemia? no  Is She on ACE inhibitor or angiotensin II receptor blocker?  Yes  lisinopril (Prinivil) 5mg  daily    The following portions of the patient's history were reviewed and updated as appropriate: allergies, current medications, past family history, past medical history, past social history, past surgical history and problem list.  Review of Systems Pertinent items are noted in HPI.    Objective:    BP 114/70 mmHg  Pulse 77  Ht 5\' 4"  (1.626 m)  Wt 177 lb (80.287 kg)  BMI 30.37 kg/m2   Lab Review GLUCOSE (mg/dL)  Date Value  11/15/2015 133*  06/22/2015 198*  01/27/2015 149*    GLUCOSE, BLD (mg/dL)  Date Value  11/11/2012 90   CO2 (mmol/L)  Date Value  11/15/2015 21  06/22/2015 20  01/27/2015 21   BUN (mg/dL)  Date Value  11/15/2015 13  06/22/2015 13  01/27/2015 15  11/11/2012 16   CREAT (mg/dL)  Date Value  11/11/2012 0.87   CREATININE, SER (mg/dL)  Date Value  11/15/2015 0.78  06/22/2015 0.73  01/27/2015 0.87   A1c = 8.1% (11/15/2015)  Assessment:    Diabetes Mellitus type II, under fair control.  Hyperlipidemia  - taking statin; Tg still elevated  Obesity   Plan:    1.  Rx changes:   Discontinue invokana  Due to cost restraints will start glimepiride 2mg  take 1 tablet with breakfast for 1 week, if FBG is over 150 then increase to 2 tablets daily 2.  Education: Reviewed 'ABCs' of diabetes management (respective goals in parentheses):  A1C (<7), blood pressure (<130/80), and cholesterol (LDL <100).  Recheck urine microalbuminuria in 3 to 6 months. 3.  Compliance at present is estimated to be good.  4.  Dietary modifications reviewed Increase non-starchy vegetables - carrots, green bean, squash, zucchini, tomatoes, onions, peppers, spinach and other green leafy vegetables, cabbage, lettuce, cucumbers, asparagus, okra (not fried), eggplant limit sugar and processed foods (cakes, cookies, ice cream, crackers and chips) Increase fresh fruit but limit serving sizes 1/2 cup or about the size of tennis or baseball limit red meat to now more than 1-2 times per week (serving size about the size  of your palm)  Choose whole grains / leans proteins - whole wheat bread, quinoa, whole grain rice (1/2 cup), fish, chicken, [place]  Instructed to decrease from 3 sodas daily to just 1 (will eventually try to stop completely) 5.   Increased exercise - goal is 150 minutes per week 6.  Continue to check BG at least daily - suggested she begin checking at varying times (am, lunch, evening) to get a better idea of BG throughout the day. 7.   Follow up: 1  month    Cherre Robins, PharmD, CPP, CDE

## 2016-01-05 ENCOUNTER — Other Ambulatory Visit: Payer: Self-pay | Admitting: Family Medicine

## 2016-01-14 ENCOUNTER — Telehealth: Payer: Self-pay | Admitting: Family Medicine

## 2016-01-17 MED ORDER — PRAMIPEXOLE DIHYDROCHLORIDE 0.25 MG PO TABS: 0.2500 mg | ORAL_TABLET | Freq: Every day | ORAL | Status: DC

## 2016-01-17 MED ORDER — RANITIDINE HCL 150 MG PO TABS: 150.0000 mg | ORAL_TABLET | Freq: Two times a day (BID) | ORAL | Status: DC

## 2016-01-17 MED ORDER — LISINOPRIL 5 MG PO TABS: 5.0000 mg | ORAL_TABLET | Freq: Every day | ORAL | Status: DC

## 2016-01-17 MED ORDER — GLIMEPIRIDE 4 MG PO TABS: 8.0000 mg | ORAL_TABLET | Freq: Every day | ORAL | Status: DC

## 2016-01-17 NOTE — Telephone Encounter (Signed)
Patient needed rx sent to pharmacy for mirapex, amaryl, rantidine and lisinopril.  She states that she was told to increase ranitidine at last visit to 150 to 300mg  bid.  Reviewed Dr Tawanna Sat notes and it did indicate that he instructed her to take 150mg  to 300mg  bid.  Rx's sent in.

## 2016-01-21 DIAGNOSIS — S2341XA Sprain of ribs, initial encounter: Secondary | ICD-10-CM | POA: Diagnosis not present

## 2016-01-21 DIAGNOSIS — M549 Dorsalgia, unspecified: Secondary | ICD-10-CM | POA: Diagnosis not present

## 2016-03-02 ENCOUNTER — Encounter: Payer: Self-pay | Admitting: *Deleted

## 2016-03-28 ENCOUNTER — Ambulatory Visit (INDEPENDENT_AMBULATORY_CARE_PROVIDER_SITE_OTHER): Payer: Medicare PPO | Admitting: Family Medicine

## 2016-03-28 ENCOUNTER — Telehealth: Payer: Self-pay | Admitting: Family Medicine

## 2016-03-28 ENCOUNTER — Encounter: Payer: Self-pay | Admitting: Family Medicine

## 2016-03-28 VITALS — BP 102/65 | HR 88 | Temp 97.4°F | Ht 64.0 in | Wt 180.0 lb

## 2016-03-28 DIAGNOSIS — E1129 Type 2 diabetes mellitus with other diabetic kidney complication: Secondary | ICD-10-CM | POA: Diagnosis not present

## 2016-03-28 DIAGNOSIS — R809 Proteinuria, unspecified: Secondary | ICD-10-CM

## 2016-03-28 DIAGNOSIS — E559 Vitamin D deficiency, unspecified: Secondary | ICD-10-CM

## 2016-03-28 DIAGNOSIS — I10 Essential (primary) hypertension: Secondary | ICD-10-CM | POA: Diagnosis not present

## 2016-03-28 DIAGNOSIS — K219 Gastro-esophageal reflux disease without esophagitis: Secondary | ICD-10-CM

## 2016-03-28 DIAGNOSIS — E785 Hyperlipidemia, unspecified: Secondary | ICD-10-CM

## 2016-03-28 LAB — BAYER DCA HB A1C WAIVED: HB A1C (BAYER DCA - WAIVED): 7.7 % — ABNORMAL HIGH (ref <7.0)

## 2016-03-28 MED ORDER — GLIMEPIRIDE 4 MG PO TABS: 8.0000 mg | ORAL_TABLET | Freq: Every day | ORAL | 0 refills | Status: DC

## 2016-03-28 MED ORDER — GLIMEPIRIDE 4 MG PO TABS: 8.0000 mg | ORAL_TABLET | Freq: Every day | ORAL | 3 refills | Status: DC

## 2016-03-28 NOTE — Progress Notes (Signed)
Subjective:    Patient ID: ANEISHA SKYLES, female    DOB: 08/19/1945, 70 y.o.   MRN: [id number]  HPI Pt here for follow up and management of chronic medical problems which includes hypertension, and diabetes. Patient is taking medications regularly. The patient's main complaint today is some pain in the right shoulder. She is requesting a refill on her glimepiride 4 mg taking 1 twice a day. We are not certain but she could've been taking 16 mg a day instead of 8 mg a day and she will call back and confirm this with the nurse said that we have the correct milligrams and to know that she is not taking too much medication. 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 She thinks she injured her right shoulder with picking up trash bags. It is some better. She did receive a shot which has helped and she will continue to use some warm wet compresses to this area. Switch some of the stress and pressure to the left arm is that of the right shoulder. The patient denies any chest pain or shortness of breath. She has no trouble with her stomach other than some occasional heartburn and she is currently taking ranitidine 300 mg twice daily and only has occasional breakthrough heartburn. She denies any trouble with swallowing nausea vomiting blood in the stool or black tarry bowel movements. She is passing her water without problems. She is due to get an eye exam and will make sure that we get a copy of that report once it is done.   Patient Active Problem List   Diagnosis Date Noted  . Pulmonary nodules 11/15/2015  . Urine test positive for microalbuminuria 07/09/2015  . Sleep apnea 01/27/2015  . Splenic artery aneurysm ([place]) 04/14/2014  . Arthralgia of hands, bilateral 01/21/2014  . Trigger finger of left hand 01/21/2014  . Vitamin D deficiency 01/21/2014  . HTN (hypertension) 11/11/2012  . HLD (hyperlipidemia) 11/11/2012  . Type 2 diabetes mellitus ([place]) 11/11/2012  .  Restless legs syndrome 11/11/2012  . GERD (gastroesophageal reflux disease) 11/11/2012   Outpatient Encounter Prescriptions as of 03/28/2016  Medication Sig  . ACCU-CHEK AVIVA PLUS test strip TEST BLOOD SUGAR TWICE A DAY   . ACCU-CHEK SOFTCLIX LANCETS lancets TEST BLOOD SUGAR TWICE A DAY  . acetaminophen (TYLENOL) 500 MG tablet Take 500 mg by mouth every 6 (six) hours as needed. Reported on 07/09/2015  . aspirin 81 MG EC tablet Take 1 tablet (81 mg total) by mouth daily. Swallow whole.  . fluticasone (FLONASE) 50 MCG/ACT nasal spray Place 1 spray into both nostrils daily.  Marland Kitchen glimepiride (AMARYL) 4 MG tablet Take 2 tablets (8 mg total) by mouth daily before breakfast.  . ibuprofen (ADVIL,MOTRIN) 200 MG tablet Take 200 mg by mouth every 8 (eight) hours as needed.  Marland Kitchen lisinopril (PRINIVIL,ZESTRIL) 5 MG tablet Take 1 tablet (5 mg total) by mouth daily.  . pramipexole (MIRAPEX) 0.25 MG tablet Take 1 tablet (0.25 mg total) by mouth at bedtime.  . ranitidine (ZANTAC) 150 MG tablet Take 1-2 tablets (150-300 mg total) by mouth 2 (two) times daily.  . simvastatin (ZOCOR) 20 MG tablet TAKE ONE TABLET BY MOUTH DAILY IN THE EVENING  . traZODone (DESYREL) 50 MG tablet TAKE 1 TABLET AT BEDTIME  . Vitamin D, Ergocalciferol, (DRISDOL) 50000 units CAPS capsule TAKE 1 CAPSULE EVERY 7 DAYS.  . [DISCONTINUED] glimepiride (AMARYL) 4 MG tablet Take 2 tablets (8 mg total) by  mouth daily before breakfast.  . [DISCONTINUED] glimepiride (AMARYL) 4 MG tablet Take 2 tablets (8 mg total) by mouth daily before breakfast.   No facility-administered encounter medications on file as of 03/28/2016.        Review of Systems  Constitutional: Negative.   HENT: Negative.   Eyes: Negative.   Respiratory: Negative.   Cardiovascular: Negative.   Gastrointestinal: Negative.   Endocrine: Negative.   Genitourinary: Negative.   Musculoskeletal: Positive for arthralgias (right shoulder).  Skin: Negative.   Allergic/Immunologic:  Negative.   Neurological: Negative.   Hematological: Negative.   Psychiatric/Behavioral: Negative.        Objective:   Physical Exam  Constitutional: She is oriented to person, place, and time. She appears well-developed and well-nourished. No distress.  HENT:  Head: Normocephalic and atraumatic.  Right Ear: External ear normal.  Left Ear: External ear normal.  Nose: Nose normal.  Mouth/Throat: Oropharynx is clear and moist.  Eyes: Conjunctivae and EOM are normal. Pupils are equal, round, and reactive to light. Right eye exhibits no discharge. Left eye exhibits no discharge. No scleral icterus.  Neck: Normal range of motion. Neck supple. No thyromegaly present.  Cardiovascular: Normal rate, regular rhythm, normal heart sounds and intact distal pulses.   No murmur heard. Heart is regular at 72/m  Pulmonary/Chest: Effort normal and breath sounds normal. No respiratory distress. She has no wheezes. She has no rales. She exhibits no tenderness.  Clear anteriorly and posteriorly  Abdominal: Soft. Bowel sounds are normal. She exhibits no mass. There is no tenderness. There is no rebound and no guarding.  No liver or spleen enlargement abdominal bruits or masses.  Musculoskeletal: Normal range of motion. She exhibits no edema.  Lymphadenopathy:    She has no cervical adenopathy.  Neurological: She is alert and oriented to person, place, and time. She has normal reflexes. No cranial nerve deficit.  Skin: Skin is warm and dry. No rash noted.  Psychiatric: She has a normal mood and affect. Her behavior is normal. Judgment and thought content normal.  Nursing note and vitals reviewed.  BP 102/65 (BP Location: Right Arm, Patient Position: Sitting)   Pulse 88   Temp 97.4 F (36.3 C) (Oral)   Ht '5\' 4"'  (1.626 m)   Wt 180 lb (81.6 kg)   BMI 30.90 kg/m         Assessment & Plan:  1. Type 2 diabetes mellitus with microalbuminuria, without long-term current use of insulin ([place]) -Please  call back with the correct milligrams on your Amaryl and understand that you should be taking 4 mg twice daily - BMP8+EGFR - Bayer DCA Hb A1c Waived - CBC with Differential/Platelet  2. HLD (hyperlipidemia) -Continue with aggressive therapeutic lifestyle changes and current cholesterol treatment pending results of lab work - CBC with Differential/Platelet - NMR, lipoprofile  3. Essential hypertension -The blood pressure is good today and she should continue with her lisinopril - BMP8+EGFR - CBC with Differential/Platelet - Hepatic function panel  4. Vitamin D deficiency -Continue with current vitamin D replacement pending results of lab work - CBC with Differential/Platelet - VITAMIN D 25 Hydroxy (Vit-D Deficiency, Fractures)  5. Gastroesophageal reflux disease, esophagitis presence not specified -Continue with ranitidine 300 mg twice daily and avoid irritating foods and caffeine - CBC with Differential/Platelet  Meds ordered this encounter  Medications  . DISCONTD: glimepiride (AMARYL) 4 MG tablet    Sig: Take 2 tablets (8 mg total) by mouth daily before breakfast.    Dispense:  180 tablet    Refill:  3  . glimepiride (AMARYL) 4 MG tablet    Sig: Take 2 tablets (8 mg total) by mouth daily before breakfast.    Dispense:  14 tablet    Refill:  0   Patient Instructions                       Medicare Annual Wellness Visit  [facility] and the medical providers at [facility] strive to bring you the best medical care.  In doing so we not only want to address your current medical conditions and concerns but also to detect new conditions early and prevent illness, disease and health-related problems.    Medicare offers a yearly Wellness Visit which allows our clinical staff to assess your need for preventative services including immunizations, lifestyle education, counseling to decrease risk of preventable diseases and screening for fall risk and other  medical concerns.    This visit is provided free of charge (no copay) for all Medicare recipients. The clinical pharmacists at [facility] have begun to conduct these Wellness Visits which will also include a thorough review of all your medications.    As you primary medical provider recommend that you make an appointment for your Annual Wellness Visit if you have not done so already this year.  You may set up this appointment before you leave today or you may call back [phone]) and schedule an appointment.  Please make sure when you call that you mention that you are scheduling your Annual Wellness Visit with the clinical pharmacist so that the appointment may be made for the proper length of time.     Continue current medications. Continue good therapeutic lifestyle changes which include good diet and exercise. Fall precautions discussed with patient. If an FOBT was given today- please return it to our front desk. If you are over 48 years old - you may need Prevnar 27 or the adult Pneumonia vaccine.   After your visit with [place] today you will receive a survey in the mail or online from [employer] regarding your care with [place]. Please take a moment to fill this out. Your feedback is very important to [place] as you can help [place] better understand your patient needs as well as improve your experience and satisfaction. WE CARE ABOUT YOU!!!   Please call back and confirm with [place] the correct milligrams on your glimepiride bottle so that we know how you have been taking that. You should be taking 4 mg twice daily. Use some warm wet compresses to the right shoulder and consider trying a salonpas to the shoulder also. Continue to monitor blood sugars closely and stay as active as possible and follow the diet as closely as possible Do not forget to get your flu shot early in October Make sure that the ophthalmologist since [place] a copy of your eye exam report  Arrie Senate MD

## 2016-03-28 NOTE — Patient Instructions (Addendum)
Medicare Annual Wellness Visit  [facility] and the medical providers at [facility] strive to bring you the best medical care.  In doing so we not only want to address your current medical conditions and concerns but also to detect new conditions early and prevent illness, disease and health-related problems.    Medicare offers a yearly Wellness Visit which allows our clinical staff to assess your need for preventative services including immunizations, lifestyle education, counseling to decrease risk of preventable diseases and screening for fall risk and other medical concerns.    This visit is provided free of charge (no copay) for all Medicare recipients. The clinical pharmacists at [facility] have begun to conduct these Wellness Visits which will also include a thorough review of all your medications.    As you primary medical provider recommend that you make an appointment for your Annual Wellness Visit if you have not done so already this year.  You may set up this appointment before you leave today or you may call back [phone] and schedule an appointment.  Please make sure when you call that you mention that you are scheduling your Annual Wellness Visit with the clinical pharmacist so that the appointment may be made for the proper length of time.     Continue current medications. Continue good therapeutic lifestyle changes which include good diet and exercise. Fall precautions discussed with patient. If an FOBT was given today- please return it to our front desk. If you are over 92 years old - you may need Prevnar 39 or the adult Pneumonia vaccine.   After your visit with [place] today you will receive a survey in the mail or online from [employer] regarding your care with [place]. Please take a moment to fill this out. Your feedback is very important to [place] as you can help [place] better understand your patient needs as well as  improve your experience and satisfaction. WE CARE ABOUT YOU!!!   Please call back and confirm with [place] the correct milligrams on your glimepiride bottle so that we know how you have been taking that. You should be taking 4 mg twice daily. Use some warm wet compresses to the right shoulder and consider trying a salonpas to the shoulder also. Continue to monitor blood sugars closely and stay as active as possible and follow the diet as closely as possible Do not forget to get your flu shot early in October Make sure that the ophthalmologist since [place] a copy of your eye exam report

## 2016-03-29 ENCOUNTER — Telehealth: Payer: Self-pay | Admitting: Family Medicine

## 2016-03-29 LAB — CBC WITH DIFFERENTIAL/PLATELET
BASOS ABS: 0 10*3/uL (ref 0.0–0.2)
Basos: 0 %
EOS (ABSOLUTE): 0.2 10*3/uL (ref 0.0–0.4)
Eos: 2 %
Hematocrit: 42 % (ref 34.0–46.6)
Hemoglobin: 15 g/dL (ref 11.1–15.9)
IMMATURE GRANS (ABS): 0 10*3/uL (ref 0.0–0.1)
Immature Granulocytes: 0 %
LYMPHS: 27 %
Lymphocytes Absolute: 2.5 10*3/uL (ref 0.7–3.1)
MCH: 30.8 pg (ref 26.6–33.0)
MCHC: 35.7 g/dL (ref 31.5–35.7)
MCV: 86 fL (ref 79–97)
MONOCYTES: 7 %
Monocytes Absolute: 0.6 10*3/uL (ref 0.1–0.9)
NEUTROS ABS: 5.9 10*3/uL (ref 1.4–7.0)
NEUTROS PCT: 64 %
PLATELETS: 247 10*3/uL (ref 150–379)
RBC: 4.87 x10E6/uL (ref 3.77–5.28)
RDW: 14.1 % (ref 12.3–15.4)
WBC: 9.3 10*3/uL (ref 3.4–10.8)

## 2016-03-29 LAB — VITAMIN D 25 HYDROXY (VIT D DEFICIENCY, FRACTURES): Vit D, 25-Hydroxy: 57.1 ng/mL (ref 30.0–100.0)

## 2016-03-29 LAB — BMP8+EGFR
BUN/Creatinine Ratio: 19 (ref 12–28)
BUN: 15 mg/dL (ref 8–27)
CALCIUM: 10 mg/dL (ref 8.7–10.3)
CHLORIDE: 98 mmol/L (ref 96–106)
CO2: 22 mmol/L (ref 18–29)
Creatinine, Ser: 0.77 mg/dL (ref 0.57–1.00)
GFR calc non Af Amer: 80 mL/min/{1.73_m2} (ref >59)
GFR, EST AFRICAN AMERICAN: 92 mL/min/{1.73_m2} (ref >59)
GLUCOSE: 75 mg/dL (ref 65–99)
POTASSIUM: 4.7 mmol/L (ref 3.5–5.2)
Sodium: 139 mmol/L (ref 134–144)

## 2016-03-29 LAB — HEPATIC FUNCTION PANEL
ALBUMIN: 4.3 g/dL (ref 3.6–4.8)
ALT: 17 IU/L (ref 0–32)
AST: 21 IU/L (ref 0–40)
Alkaline Phosphatase: 113 IU/L (ref 39–117)
BILIRUBIN, DIRECT: 0.11 mg/dL (ref 0.00–0.40)
Bilirubin Total: 0.3 mg/dL (ref 0.0–1.2)
TOTAL PROTEIN: 7.2 g/dL (ref 6.0–8.5)

## 2016-03-29 LAB — NMR, LIPOPROFILE
CHOLESTEROL: 200 mg/dL — AB (ref 100–199)
HDL Cholesterol by NMR: 48 mg/dL (ref >39)
HDL PARTICLE NUMBER: 27.6 umol/L — AB (ref >=30.5)
LDL PARTICLE NUMBER: 1517 nmol/L — AB (ref <1000)
LDL SIZE: 21.1 nm (ref >20.5)
LDL-C: 120 mg/dL — AB (ref 0–99)
LP-IR Score: 79 — ABNORMAL HIGH (ref <=45)
Small LDL Particle Number: 634 nmol/L — ABNORMAL HIGH (ref <=527)
TRIGLYCERIDES BY NMR: 161 mg/dL — AB (ref 0–149)

## 2016-03-29 NOTE — Telephone Encounter (Signed)
meds handled

## 2016-03-29 NOTE — Telephone Encounter (Signed)
lmtcb

## 2016-03-29 NOTE — Telephone Encounter (Signed)
Pt notified of results Verbalizes understanding 

## 2016-03-30 ENCOUNTER — Other Ambulatory Visit: Payer: Self-pay | Admitting: *Deleted

## 2016-03-30 ENCOUNTER — Ambulatory Visit: Payer: Medicare PPO | Admitting: Family Medicine

## 2016-03-30 MED ORDER — ATORVASTATIN CALCIUM 40 MG PO TABS: 40.0000 mg | ORAL_TABLET | Freq: Every day | ORAL | 3 refills | Status: DC

## 2016-04-06 DIAGNOSIS — M545 Low back pain: Secondary | ICD-10-CM | POA: Diagnosis not present

## 2016-04-06 DIAGNOSIS — M5126 Other intervertebral disc displacement, lumbar region: Secondary | ICD-10-CM | POA: Diagnosis not present

## 2016-04-07 ENCOUNTER — Other Ambulatory Visit: Payer: Self-pay | Admitting: Pharmacist

## 2016-04-07 NOTE — Patient Outreach (Signed)
Outreach call to [employer] regarding her request for follow up from the [facility] Medication Adherence Campaign. Left a HIPAA compliant message on the patient's voicemail.   Harlow Asa, PharmD Clinical Pharmacist [facility] [phone]

## 2016-04-07 NOTE — Patient Outreach (Signed)
Receive a voicemail from Ms. Laidler returning my call. Call and speak with patient. HIPAA identifiers verified and verbal consent received.  Patient reports that she has been taking her lisnopril as directed. Denies any barriers to taking her medications such as cost or side effects. Reports that she misses a dose maybe once every 2 or 3 weeks. Counsel patient on the importance of medication adherence.    Patient reports that she has no medication questions or concerns at this time.  Harlow Asa, PharmD Clinical Pharmacist [facility] [phone]

## 2016-05-09 ENCOUNTER — Telehealth: Payer: Self-pay | Admitting: Family Medicine

## 2016-05-09 MED ORDER — MELOXICAM 15 MG PO TABS: ORAL_TABLET | ORAL | 1 refills | Status: DC

## 2016-05-09 NOTE — Telephone Encounter (Signed)
Pt wants to restart mobic  - (she was on this in 2016) can we send it in to her mail order pharm?   * Send to pool  Or Zigmund Daniel  with answer *

## 2016-05-09 NOTE — Telephone Encounter (Signed)
Not on med list

## 2016-05-09 NOTE — Telephone Encounter (Signed)
you may refill the mobic. If it is 15 mg she should only take a half a one daily after eating if needed. Her last BMP and creatinine were good. Because she has diabetes and hypertension she needs to really be careful with any NSAID and mobic is an NSAID. She should take as little as possible.

## 2016-05-30 DIAGNOSIS — I10 Essential (primary) hypertension: Secondary | ICD-10-CM | POA: Diagnosis not present

## 2016-05-30 DIAGNOSIS — G4700 Insomnia, unspecified: Secondary | ICD-10-CM | POA: Diagnosis not present

## 2016-05-30 DIAGNOSIS — E669 Obesity, unspecified: Secondary | ICD-10-CM | POA: Diagnosis not present

## 2016-05-30 DIAGNOSIS — E119 Type 2 diabetes mellitus without complications: Secondary | ICD-10-CM | POA: Diagnosis not present

## 2016-05-30 DIAGNOSIS — E559 Vitamin D deficiency, unspecified: Secondary | ICD-10-CM | POA: Diagnosis not present

## 2016-05-30 DIAGNOSIS — I714 Abdominal aortic aneurysm, without rupture: Secondary | ICD-10-CM | POA: Diagnosis not present

## 2016-05-30 DIAGNOSIS — K219 Gastro-esophageal reflux disease without esophagitis: Secondary | ICD-10-CM | POA: Diagnosis not present

## 2016-05-30 DIAGNOSIS — E785 Hyperlipidemia, unspecified: Secondary | ICD-10-CM | POA: Diagnosis not present

## 2016-05-30 DIAGNOSIS — G2581 Restless legs syndrome: Secondary | ICD-10-CM | POA: Diagnosis not present

## 2016-07-05 ENCOUNTER — Other Ambulatory Visit: Payer: Self-pay | Admitting: Family Medicine

## 2016-07-07 DIAGNOSIS — M62838 Other muscle spasm: Secondary | ICD-10-CM | POA: Diagnosis not present

## 2016-08-02 ENCOUNTER — Ambulatory Visit (INDEPENDENT_AMBULATORY_CARE_PROVIDER_SITE_OTHER): Payer: Medicare PPO

## 2016-08-02 ENCOUNTER — Ambulatory Visit (INDEPENDENT_AMBULATORY_CARE_PROVIDER_SITE_OTHER): Payer: Medicare PPO | Admitting: Family Medicine

## 2016-08-02 ENCOUNTER — Encounter: Payer: Self-pay | Admitting: Family Medicine

## 2016-08-02 VITALS — BP 111/61 | HR 78 | Temp 97.0°F | Ht 64.0 in | Wt 183.0 lb

## 2016-08-02 DIAGNOSIS — E559 Vitamin D deficiency, unspecified: Secondary | ICD-10-CM | POA: Diagnosis not present

## 2016-08-02 DIAGNOSIS — Z780 Asymptomatic menopausal state: Secondary | ICD-10-CM

## 2016-08-02 DIAGNOSIS — K219 Gastro-esophageal reflux disease without esophagitis: Secondary | ICD-10-CM | POA: Diagnosis not present

## 2016-08-02 DIAGNOSIS — R809 Proteinuria, unspecified: Secondary | ICD-10-CM

## 2016-08-02 DIAGNOSIS — E7800 Pure hypercholesterolemia, unspecified: Secondary | ICD-10-CM

## 2016-08-02 DIAGNOSIS — R5383 Other fatigue: Secondary | ICD-10-CM | POA: Diagnosis not present

## 2016-08-02 DIAGNOSIS — Z8669 Personal history of other diseases of the nervous system and sense organs: Secondary | ICD-10-CM | POA: Diagnosis not present

## 2016-08-02 DIAGNOSIS — I10 Essential (primary) hypertension: Secondary | ICD-10-CM | POA: Diagnosis not present

## 2016-08-02 DIAGNOSIS — E1129 Type 2 diabetes mellitus with other diabetic kidney complication: Secondary | ICD-10-CM | POA: Diagnosis not present

## 2016-08-02 DIAGNOSIS — Z13820 Encounter for screening for osteoporosis: Secondary | ICD-10-CM

## 2016-08-02 DIAGNOSIS — M79672 Pain in left foot: Secondary | ICD-10-CM | POA: Diagnosis not present

## 2016-08-02 LAB — BAYER DCA HB A1C WAIVED: HB A1C (BAYER DCA - WAIVED): 9.3 % — ABNORMAL HIGH (ref <7.0)

## 2016-08-02 NOTE — Addendum Note (Signed)
Addended by: Zannie Cove on: 08/02/2016 12:04 PM   Modules accepted: Orders

## 2016-08-02 NOTE — Patient Instructions (Addendum)
Medicare Annual Wellness Visit  [place] [facility] and the medical providers at [facility] strive to bring you the best medical care.  In doing so we not only want to address your current medical conditions and concerns but also to detect new conditions early and prevent illness, disease and health-related problems.    Medicare offers a yearly Wellness Visit which allows our clinical staff to assess your need for preventative services including immunizations, lifestyle education, counseling to decrease risk of preventable diseases and screening for fall risk and other medical concerns.    This visit is provided free of charge (no copay) for all Medicare recipients. The clinical pharmacists at [facility] have begun to conduct these Wellness Visits which will also include a thorough review of all your medications.    As you primary medical provider recommend that you make an appointment for your Annual Wellness Visit if you have not done so already this year.  You may set up this appointment before you leave today or you may call back [phone] and schedule an appointment.  Please make sure when you call that you mention that you are scheduling your Annual Wellness Visit with the clinical pharmacist so that the appointment may be made for the proper length of time.     Continue current medications. Continue good therapeutic lifestyle changes which include good diet and exercise. Fall precautions discussed with patient. If an FOBT was given today- please return it to our front desk. If you are over 50 years old - you may need Prevnar 42 or the adult Pneumonia vaccine.  **Flu shots are available--- please call and schedule a FLU-CLINIC appointment**  After your visit with [place] today you will receive a survey in the mail or online from [employer] regarding your care with [place]. Please take a moment to fill this out. Your feedback is very  important to [place] as you can help [place] better understand your patient needs as well as improve your experience and satisfaction. WE CARE ABOUT YOU!!!   We will arrange for you to see the cardiologist because of multiple risk factors for heart disease and a mother that died in her early 57s of a massive heart attack. Because of the daytime fatigue and history of sleep apnea we will arrange for a sleep apnea evaluation We will call with the results of the heel x-rays as soon as they become available Please do not forget to get your eye exam and make sure that we get a copy of that report as soon as it is done

## 2016-08-02 NOTE — Progress Notes (Signed)
Subjective:    Patient ID: Theresa Wilson, female    DOB: 1946/06/08, 71 y.o.   MRN: [id number]  HPI Pt here for follow up and management of chronic medical problems which includes diabetes, hyperlipidemia, hypertension. She is taking medication regularly.The patient is doing well overall. She is due to get a DEXA scan which will be done today. She is also due to get her lab work which will be done today. She complains today of some left heel pain. The hip pain has been going on for about 2 weeks. The patient does not recall any injury. It is worse when she arises and first gets on her feet and then gets better with walking. She denies any chest pain pressure tightness or shortness of breath. She does have multiple risk factors for heart disease including diabetes hypertension and hyperlipidemia. Her mother died of a massive heart attack when she was in her early 68s. The patient has never had a cardiac evaluation and we will arrange this for her because of her family history and personal history. She denies any trouble with her stomach trouble swallowing heartburn indigestion nausea vomiting diarrhea or blood in the stool. She is passing her water without problems.    Patient Active Problem List   Diagnosis Date Noted  . Pulmonary nodules 11/15/2015  . Urine test positive for microalbuminuria 07/09/2015  . Sleep apnea 01/27/2015  . Splenic artery aneurysm ([place]) 04/14/2014  . Arthralgia of hands, bilateral 01/21/2014  . Trigger finger of left hand 01/21/2014  . Vitamin D deficiency 01/21/2014  . HTN (hypertension) 11/11/2012  . HLD (hyperlipidemia) 11/11/2012  . Type 2 diabetes mellitus ([place]) 11/11/2012  . Restless legs syndrome 11/11/2012  . GERD (gastroesophageal reflux disease) 11/11/2012   Outpatient Encounter Prescriptions as of 08/02/2016  Medication Sig  . ACCU-CHEK AVIVA PLUS test strip TEST BLOOD SUGAR TWICE A DAY   . ACCU-CHEK SOFTCLIX LANCETS lancets TEST BLOOD SUGAR TWICE A  DAY  . acetaminophen (TYLENOL) 500 MG tablet Take 500 mg by mouth every 6 (six) hours as needed. Reported on 07/09/2015  . aspirin 81 MG EC tablet Take 1 tablet (81 mg total) by mouth daily. Swallow whole.  Marland Kitchen atorvastatin (LIPITOR) 40 MG tablet Take 1 tablet (40 mg total) by mouth daily.  . fluticasone (FLONASE) 50 MCG/ACT nasal spray Place 1 spray into both nostrils daily.  Marland Kitchen glimepiride (AMARYL) 4 MG tablet Take 2 tablets (8 mg total) by mouth daily before breakfast.  . ibuprofen (ADVIL,MOTRIN) 200 MG tablet Take 200 mg by mouth every 8 (eight) hours as needed.  Marland Kitchen lisinopril (PRINIVIL,ZESTRIL) 5 MG tablet TAKE 1 TABLET EVERY DAY  . meloxicam (MOBIC) 15 MG tablet Take 1/2 to 1 whole tab daily PRN  . pramipexole (MIRAPEX) 0.25 MG tablet TAKE 1 TABLET AT BEDTIME  . ranitidine (ZANTAC) 150 MG tablet Take 1-2 tablets (150-300 mg total) by mouth 2 (two) times daily.  . traZODone (DESYREL) 50 MG tablet TAKE 1 TABLET AT BEDTIME  . Vitamin D, Ergocalciferol, (DRISDOL) 50000 units CAPS capsule TAKE 1 CAPSULE EVERY 7 DAYS.  . [DISCONTINUED] simvastatin (ZOCOR) 20 MG tablet TAKE ONE TABLET BY MOUTH DAILY IN THE EVENING   No facility-administered encounter medications on file as of 08/02/2016.       Review of Systems  Constitutional: Negative.   HENT: Negative.   Eyes: Negative.   Respiratory: Negative.   Cardiovascular: Negative.   Gastrointestinal: Negative.   Endocrine: Negative.   Genitourinary: Negative.   Musculoskeletal:  Positive for arthralgias (left heel pain).  Skin: Negative.   Allergic/Immunologic: Negative.   Neurological: Negative.   Hematological: Negative.   Psychiatric/Behavioral: Negative.        Objective:   Physical Exam  Constitutional: She is oriented to person, place, and time. She appears well-developed and well-nourished. No distress.  HENT:  Head: Normocephalic and atraumatic.  Right Ear: External ear normal.  Left Ear: External ear normal.  Mouth/Throat:  Oropharynx is clear and moist. No oropharyngeal exudate.  Slight nasal congestion  Eyes: Conjunctivae and EOM are normal. Pupils are equal, round, and reactive to light. Right eye exhibits no discharge. Left eye exhibits no discharge. No scleral icterus.  Neck: Normal range of motion. Neck supple. No thyromegaly present.  No bruits thyromegaly or anterior cervical adenopathy  Cardiovascular: Normal rate, regular rhythm and intact distal pulses.   No murmur heard. Heart is regular at 72/m  Pulmonary/Chest: Effort normal and breath sounds normal. No respiratory distress. She has no wheezes. She has no rales. She exhibits no tenderness.  Clear anteriorly and posteriorly  Abdominal: Soft. Bowel sounds are normal. She exhibits no mass. There is no tenderness. There is no rebound and no guarding.  Musculoskeletal: Normal range of motion. She exhibits no edema.  Lymphadenopathy:    She has no cervical adenopathy.  Neurological: She is alert and oriented to person, place, and time. She has normal reflexes. No cranial nerve deficit.  Skin: Skin is warm and dry. No rash noted.  Psychiatric: She has a normal mood and affect. Her behavior is normal. Judgment and thought content normal.  Nursing note and vitals reviewed.  BP 111/61 (BP Location: Left Arm)   Pulse 78   Temp 97 F (36.1 C) (Oral)   Ht '5\' 4"'  (1.626 m)   Wt 183 lb (83 kg)   BMI 31.41 kg/m   The patient will be getting an x-ray of her left heel today. She will also get a DEXA scan today.      Assessment & Plan:  1. Type 2 diabetes mellitus with microalbuminuria, without long-term current use of insulin (HCC) -Continue with current treatment and aggressive therapeutic lifestyle changes pending results of lab work - BMP8+EGFR - [facility] [place] Hb A1c Waived - CBC with Differential/Platelet - Ambulatory referral to Cardiology  2. Pure hypercholesterolemia -Continue with current treatment pending results of lab work - CBC with  Differential/Platelet - NMR, lipoprofile - Ambulatory referral to Cardiology  3. Essential hypertension -Blood pressure is good today and she will continue with current treatment - BMP8+EGFR - CBC with Differential/Platelet - Hepatic function panel - Ambulatory referral to Cardiology  4. Vitamin D deficiency -Continue with current treatment pending results of lab work - DG [place]; Future - CBC with Differential/Platelet - VITAMIN D 25 Hydroxy (Vit-D Deficiency, Fractures)  5. Gastroesophageal reflux disease, esophagitis presence not specified -The patient has no complaints with this today and she will continue with her ranitidine. - CBC with Differential/Platelet  6. Screening for osteoporosis - DG [facility] DEXA; Future  7. Postmenopausal - DG [facility] DEXA; Future  8. Pain of left heel - DG [facility]; Future  9. Fatigue, unspecified type -Sleep apnea evaluation  Patient Instructions                       Medicare Annual Wellness Visit  [facility] and the medical providers at [facility] strive to bring you the best medical care.  In doing so  we not only want to address your current medical conditions and concerns but also to detect new conditions early and prevent illness, disease and health-related problems.    Medicare offers a yearly Wellness Visit which allows our clinical staff to assess your need for preventative services including immunizations, lifestyle education, counseling to decrease risk of preventable diseases and screening for fall risk and other medical concerns.    This visit is provided free of charge (no copay) for all Medicare recipients. The clinical pharmacists at [facility] have begun to conduct these Wellness Visits which will also include a thorough review of all your medications.    As you primary medical provider recommend that you make an appointment for your Annual Wellness Visit if you have  not done so already this year.  You may set up this appointment before you leave today or you may call back ([phone]) and schedule an appointment.  Please make sure when you call that you mention that you are scheduling your Annual Wellness Visit with the clinical pharmacist so that the appointment may be made for the proper length of time.     Continue current medications. Continue good therapeutic lifestyle changes which include good diet and exercise. Fall precautions discussed with patient. If an FOBT was given today- please return it to our front desk. If you are over 49 years old - you may need Prevnar 74 or the adult Pneumonia vaccine.  **Flu shots are available--- please call and schedule a FLU-CLINIC appointment**  After your visit with [place] today you will receive a survey in the mail or online from [employer] regarding your care with [place]. Please take a moment to fill this out. Your feedback is very important to [place] as you can help [place] better understand your patient needs as well as improve your experience and satisfaction. WE CARE ABOUT YOU!!!   We will arrange for you to see the cardiologist because of multiple risk factors for heart disease and a mother that died in her early 8s of a massive heart attack. Because of the daytime fatigue and history of sleep apnea we will arrange for a sleep apnea evaluation We will call with the results of the heel x-rays as soon as they become available Please do not forget to get your eye exam and make sure that we get a copy of that report as soon as it is done  Arrie Senate MD

## 2016-08-03 ENCOUNTER — Encounter: Payer: Self-pay | Admitting: Pharmacist

## 2016-08-03 ENCOUNTER — Ambulatory Visit (INDEPENDENT_AMBULATORY_CARE_PROVIDER_SITE_OTHER): Payer: Medicare PPO | Admitting: Pharmacist

## 2016-08-03 VITALS — BP 110/78 | HR 70 | Ht 64.0 in | Wt 183.0 lb

## 2016-08-03 DIAGNOSIS — E1129 Type 2 diabetes mellitus with other diabetic kidney complication: Secondary | ICD-10-CM | POA: Diagnosis not present

## 2016-08-03 DIAGNOSIS — E782 Mixed hyperlipidemia: Secondary | ICD-10-CM

## 2016-08-03 DIAGNOSIS — R809 Proteinuria, unspecified: Secondary | ICD-10-CM

## 2016-08-03 DIAGNOSIS — E669 Obesity, unspecified: Secondary | ICD-10-CM

## 2016-08-03 LAB — BMP8+EGFR
BUN / CREAT RATIO: 16 (ref 12–28)
BUN: 13 mg/dL (ref 8–27)
CO2: 22 mmol/L (ref 18–29)
CREATININE: 0.82 mg/dL (ref 0.57–1.00)
Calcium: 9.7 mg/dL (ref 8.7–10.3)
Chloride: 99 mmol/L (ref 96–106)
GFR calc non Af Amer: 74 mL/min/{1.73_m2} (ref >59)
GFR, EST AFRICAN AMERICAN: 85 mL/min/{1.73_m2} (ref >59)
GLUCOSE: 152 mg/dL — AB (ref 65–99)
Potassium: 4.3 mmol/L (ref 3.5–5.2)
SODIUM: 139 mmol/L (ref 134–144)

## 2016-08-03 LAB — NMR, LIPOPROFILE
CHOLESTEROL: 181 mg/dL (ref 100–199)
HDL Cholesterol by NMR: 47 mg/dL (ref >39)
HDL PARTICLE NUMBER: 27.7 umol/L — AB (ref >=30.5)
LDL Particle Number: 1345 nmol/L — ABNORMAL HIGH (ref <1000)
LDL SIZE: 20.7 nm (ref >20.5)
LDL-C: 102 mg/dL — ABNORMAL HIGH (ref 0–99)
LP-IR Score: 80 — ABNORMAL HIGH (ref <=45)
Small LDL Particle Number: 663 nmol/L — ABNORMAL HIGH (ref <=527)
TRIGLYCERIDES BY NMR: 162 mg/dL — AB (ref 0–149)

## 2016-08-03 LAB — CBC WITH DIFFERENTIAL/PLATELET
BASOS: 1 %
Basophils Absolute: 0 10*3/uL (ref 0.0–0.2)
EOS (ABSOLUTE): 0.2 10*3/uL (ref 0.0–0.4)
EOS: 4 %
HEMATOCRIT: 40 % (ref 34.0–46.6)
HEMOGLOBIN: 14.1 g/dL (ref 11.1–15.9)
IMMATURE GRANS (ABS): 0 10*3/uL (ref 0.0–0.1)
Immature Granulocytes: 0 %
LYMPHS ABS: 1.8 10*3/uL (ref 0.7–3.1)
LYMPHS: 30 %
MCH: 30.9 pg (ref 26.6–33.0)
MCHC: 35.3 g/dL (ref 31.5–35.7)
MCV: 88 fL (ref 79–97)
MONOCYTES: 9 %
Monocytes Absolute: 0.5 10*3/uL (ref 0.1–0.9)
NEUTROS ABS: 3.4 10*3/uL (ref 1.4–7.0)
Neutrophils: 56 %
Platelets: 235 10*3/uL (ref 150–379)
RBC: 4.57 x10E6/uL (ref 3.77–5.28)
RDW: 13.2 % (ref 12.3–15.4)
WBC: 5.9 10*3/uL (ref 3.4–10.8)

## 2016-08-03 LAB — HEPATIC FUNCTION PANEL
ALBUMIN: 4.3 g/dL (ref 3.6–4.8)
ALK PHOS: 133 IU/L — AB (ref 39–117)
ALT: 22 IU/L (ref 0–32)
AST: 23 IU/L (ref 0–40)
BILIRUBIN TOTAL: 0.4 mg/dL (ref 0.0–1.2)
Bilirubin, Direct: 0.12 mg/dL (ref 0.00–0.40)
Total Protein: 6.5 g/dL (ref 6.0–8.5)

## 2016-08-03 LAB — VITAMIN D 25 HYDROXY (VIT D DEFICIENCY, FRACTURES): Vit D, 25-Hydroxy: 48.7 ng/mL (ref 30.0–100.0)

## 2016-08-03 MED ORDER — DULAGLUTIDE 0.75 MG/0.5ML ~~LOC~~ SOAJ: SUBCUTANEOUS | 1 refills | Status: DC

## 2016-08-03 NOTE — Progress Notes (Signed)
Subjective:    Theresa Wilson is a 71 y.o. female who presents for evaluation of Type 2 diabetes mellitus and diabetes education. The patient was initially diagnosed with Type 2 diabetes mellitus 5 years ago  Current diabetic medications include: glimepiride 4mg  1 tablet bid.  She had taken Invoakana 300mg  daily in the past however this was stopped due to cost last year.    She has tried metformin and metformin XR in past and had worsening of loose stools, gas and bloating.  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  Current monitoring regimen: home blood tests - not checking BG regularly. "I just got disguised with high readings and stopped checking" Home blood sugar records: no records to review Any episodes of hypoglycemia? no  Is She on ACE inhibitor or angiotensin II receptor blocker?  Yes  lisinopril (Prinivil) 5mg  daily    The following portions of the patient's history were reviewed and updated as appropriate: allergies, current medications and problem list.   Objective:    BP 110/78   Pulse 70   Ht 5\' 4"  (1.626 m)   Wt 183 lb (83 kg)   BMI 31.41 kg/m   A1c 9.3% (07/24/2016) Previous A1c 03/28/2016 was 7.7%  Lab Review Glucose (mg/dL)  Date Value  08/02/2016 152 (H)  03/28/2016 75  11/15/2015 133 (H)   Glucose, Bld (mg/dL)  Date Value  11/11/2012 90   CO2 (mmol/L)  Date Value  08/02/2016 22  03/28/2016 22  11/15/2015 21   BUN (mg/dL)  Date Value  08/02/2016 13  03/28/2016 15  11/15/2015 13   Creat (mg/dL)  Date Value  11/11/2012 0.87    Creatinine, Ser (mg/dL)  Date Value  08/02/2016 0.82  03/28/2016 0.77  11/15/2015 0.78    Assessment:    Diabetes Mellitus type II, under inadequate control.  Hyperlipidemia  - taking statin; LDL and Tg slightly elevated Obesity - weight has increased over last 6 montsh   Plan:    1.  Rx changes:   Start Trulicity 0.75mg  SQ weekly - first injection given in office today.  Patient tolerated well. Hoping that Trulicity will also help decrease weight.   Continue glimepiride 4mg  bid 2.  Education: Reviewed 'ABCs' of diabetes management (respective goals in parentheses):  A1C (<7), blood pressure (<130/80), and cholesterol (LDL <100).  Recheck urine microalbuminuria in 3 to 6 months. 3.  Reviewed s/s of hypoglyecmia and how to treat.  If patient experience unexplained hypoglycemia she is to call office for medication adjustment. 4.  Dietary modifications reviewed - below suggestions should help with BG, TG, LDL and weight Increase non-starchy vegetables - carrots, green bean, squash, zucchini, tomatoes, onions, peppers, spinach and other green leafy vegetables, cabbage, lettuce, cucumbers, asparagus, okra (not fried), eggplant limit sugar and processed foods (cakes, cookies, ice cream, crackers and chips) Increase fresh fruit but limit serving sizes 1/2 cup or about the size of tennis or baseball limit red meat to now more than 1-2 times per week (serving size about the size of your palm)  Choose whole grains / leans proteins - whole wheat bread, quinoa, whole grain rice (  1/2 cup), fish, chicken, [place]  Instructed to decrease from 3 sodas daily to just 1 (will eventually try to stop completely) 5.   Increased exercise - goal is 150 minutes per week 6.  Restart checking BG at least daily  7.   Follow up: 1 month    Cherre Robins, PharmD, CPP, CDE

## 2016-08-03 NOTE — Patient Instructions (Signed)
Hypoglycemia Hypoglycemia occurs when the level of sugar (glucose) in the blood is too low. Glucose is a type of sugar that provides the body's main source of energy. Certain hormones (insulin and glucagon) control the level of glucose in the blood. Insulin lowers blood glucose, and glucagon increases blood glucose. Hypoglycemia can result from having too much insulin in the bloodstream, or from not eating enough food that contains glucose. Hypoglycemia can happen in people who do or do not have diabetes. It can develop quickly, and it can be a medical emergency. What are the causes? Hypoglycemia occurs most often in people who have diabetes. If you have diabetes, hypoglycemia may be caused by:  Diabetes medicine.  Not eating enough, or not eating often enough.  Increased physical activity.  Drinking alcohol, especially when you have not eaten recently. If you do not have diabetes, hypoglycemia may be caused by:  A tumor in the pancreas. The pancreas is the organ that makes insulin.  Not eating enough, or not eating for long periods at a time (fasting).  Severe infection or illness that affects the liver, heart, or kidneys.  Certain medicines. You may also have reactive hypoglycemia. This condition causes hypoglycemia within 4 hours of eating a meal. This may occur after having stomach surgery. Sometimes, the cause of reactive hypoglycemia is not known. What increases the risk? Hypoglycemia is more likely to develop in:  People who have diabetes and take medicines to lower blood glucose.  People who abuse alcohol.  People who have a severe illness. What are the signs or symptoms? Hypoglycemia may not cause any symptoms. If you have symptoms, they may include:  Hunger.  Anxiety.  Sweating and feeling clammy.  Confusion.  Dizziness or feeling light-headed.  Sleepiness.  Nausea.  Increased heart rate.  Headache.  Blurry vision.  Seizure.  Nightmares.  Tingling  or numbness around the mouth, lips, or tongue.  A change in speech.  Decreased ability to concentrate.  A change in coordination.  Restless sleep.  Tremors or shakes.  Fainting.  Irritability. How is this diagnosed? Hypoglycemia is diagnosed with a blood test to measure your blood glucose level. This blood test is done while you are having symptoms. Your health care provider may also do a physical exam and review your medical history. If you do not have diabetes, other tests may be done to find the cause of your hypoglycemia. How is this treated? This condition can often be treated by immediately eating or drinking something that contains glucose, such as:  3-4 sugar tablets (glucose pills).  Glucose gel, 15-gram tube.  Fruit juice, 4 oz (120 mL).  Regular soda (not diet soda), 4 oz (120 mL).  Low-fat milk, 4 oz (120 mL).  Several pieces of hard candy.  Sugar or honey, 1 Tbsp. Treating Hypoglycemia If You Have Diabetes  If you are alert and able to swallow safely, follow the 15:15 rule:  Take 15 grams of a rapid-acting carbohydrate. Rapid-acting options include:  1 tube of glucose gel.  3 glucose pills.  6-8 pieces of hard candy.  4 oz (120 mL) of fruit juice.  4 oz (120 ml) of regular (not diet) soda.  Check your blood glucose 15 minutes after you take the carbohydrate.  If the repeat blood glucose level is still at or below 70 mg/dL (3.9 mmol/L), take 15 grams of a carbohydrate again.  If your blood glucose level does not increase above 70 mg/dL (3.9 mmol/L) after 3 tries, seek emergency   medical care.  After your blood glucose level returns to normal, eat a meal or a snack within 1 hour. Treating Severe Hypoglycemia  Severe hypoglycemia is when your blood glucose level is at or below 54 mg/dL (3 mmol/L). Severe hypoglycemia is an emergency. Do not wait to see if the symptoms will go away. Get medical help right away. Call your local emergency services (911  in the [place] Do not drive yourself to the hospital. If you have severe hypoglycemia and you cannot eat or drink, you may need an injection of glucagon. A family member or close friend should learn how to check your blood glucose and how to give you a glucagon injection. Ask your health care provider if you need to have an emergency glucagon injection kit available. Severe hypoglycemia may need to be treated in a hospital. The treatment may include getting glucose through an IV tube. You may also need treatment for the cause of your hypoglycemia. Follow these instructions at home: General instructions  Avoid any diets that cause you to not eat enough food. Talk with your health care provider before you start any new diet.  Take over-the-counter and prescription medicines only as told by your health care provider.  Limit alcohol intake to no more than 1 drink per day for nonpregnant women and 2 drinks per day for men. One drink equals 12 oz of beer, 5 oz of wine, or 1 oz of hard liquor.  Keep all follow-up visits as told by your health care provider. This is important. If You Have Diabetes:   Make sure you know the symptoms of hypoglycemia.  Always have a rapid-acting carbohydrate snack with you to treat low blood sugar.  Follow your diabetes management plan, as told by your health care provider. Make sure you:  Take your medicines as directed.  Follow your exercise plan.  Follow your meal plan. Eat on time, and do not skip meals.  Check your blood glucose as often as directed. Make sure to check your blood glucose before and after exercise. If you exercise longer or in a different way than usual, check your blood glucose more often.  Follow your sick day plan whenever you cannot eat or drink normally. Make this plan in advance with your health care provider.  Share your diabetes management plan with people in your workplace, school, and household.  Check your urine for ketones when  you are ill and as told by your health care provider.  Carry a medical alert card or wear medical alert jewelry. If You Have Reactive Hypoglycemia or Low Blood Sugar From Other Causes:  Monitor your blood glucose as told by your health care provider.  Follow instructions from your health care provider about eating or drinking restrictions. Contact a health care provider if:  You have problems keeping your blood glucose in your target range.  You have frequent episodes of hypoglycemia. Get help right away if:  You continue to have hypoglycemia symptoms after eating or drinking something containing glucose.  Your blood glucose is at or below 54 mg/dL (3 mmol/L).  You have a seizure.  You faint. These symptoms may represent a serious problem that is an emergency. Do not wait to see if the symptoms will go away. Get medical help right away. Call your local emergency services (911 in the [place] Do not drive yourself to the hospital.  This information is not intended to replace advice given to you by your health care provider. Make sure you   discuss any questions you have with your health care provider. Document Released: 07/10/2005 Document Revised: 12/22/2015 Document Reviewed: 08/13/2015 Elsevier Interactive Patient Education  2017 [employer]  

## 2016-08-07 ENCOUNTER — Other Ambulatory Visit: Payer: Self-pay | Admitting: Family Medicine

## 2016-08-08 ENCOUNTER — Telehealth: Payer: Self-pay | Admitting: Family Medicine

## 2016-08-08 NOTE — Telephone Encounter (Signed)
Patient was notified by [facility] that deductible is $400 and that would be the cost for the first fill of Trulicity.  She was unable to afford this.  I gave her 2 more weeks of samples to last until her next appt.  At that time will recheck her insurance and make changes in meds as needed.

## 2016-08-11 ENCOUNTER — Other Ambulatory Visit: Payer: Self-pay | Admitting: *Deleted

## 2016-08-15 DIAGNOSIS — E119 Type 2 diabetes mellitus without complications: Secondary | ICD-10-CM | POA: Diagnosis not present

## 2016-08-15 DIAGNOSIS — I10 Essential (primary) hypertension: Secondary | ICD-10-CM | POA: Diagnosis not present

## 2016-08-15 DIAGNOSIS — H538 Other visual disturbances: Secondary | ICD-10-CM | POA: Diagnosis not present

## 2016-08-30 NOTE — Progress Notes (Signed)
Cardiology Office Note   Date:  08/31/2016   ID:  Shann, Radcliff 05-18-1946, MRN [id number]  PCP:  Redge Gainer, MD  Cardiologist:   Minus Breeding, MD  Referring:  Redge Gainer, MD  Chief Complaint  Patient presents with  . Chest Pain      History of Present Illness: Theresa Wilson is a 71 y.o. female who presents for evaluation of multiple cardiovascular risk factors.   She has no prior cardiac history. However, she's had diabetes hypertension hyperlipidemia for some time. She does get chest discomfort if she's under significant stress. She works as a Secretary/administrator at [employer]. The discomfort is 3 out of 10. His right side and into her right arm. This has been a stable pattern going on for some time. He denies any sternal or left sided discomfort. She doesn't have any associated shortness of breath, nausea or vomiting. She doesn't any diaphoresis. She denies any palpitations, presyncope or syncope. She's not had any prior cardiac workup.  Past Medical History:  Diagnosis Date  . Allergy   . Aneurysm of splenic artery (HCC)   . Cataract   . Diabetes mellitus without complication ([place])   . Hyperlipidemia   . Hypertension   . Restless leg syndrome     Past Surgical History:  Procedure Laterality Date  . OVARIAN CYST REMOVAL       Current Outpatient Prescriptions  Medication Sig Dispense Refill  . ACCU-CHEK AVIVA PLUS test strip TEST BLOOD SUGAR TWICE A DAY  200 each 2  . ACCU-CHEK SOFTCLIX LANCETS lancets TEST BLOOD SUGAR TWICE A DAY 200 each 2  . acetaminophen (TYLENOL) 500 MG tablet Take 500 mg by mouth every 6 (six) hours as needed. Reported on 07/09/2015    . aspirin 81 MG EC tablet Take 1 tablet (81 mg total) by mouth daily. Swallow whole. 30 tablet 12  . atorvastatin (LIPITOR) 40 MG tablet Take 1 tablet (40 mg total) by mouth daily. 90 tablet 3  . Dulaglutide (TRULICITY) A999333 0000000 SOPN Inject 1 syringeful once weekly on Thursdays 4 pen 1  .  fluticasone (FLONASE) 50 MCG/ACT nasal spray Place 1 spray into both nostrils daily.    Marland Kitchen glimepiride (AMARYL) 4 MG tablet Take 2 tablets (8 mg total) by mouth daily before breakfast. (Patient taking differently: Take 4 mg by mouth 2 (two) times daily. ) 14 tablet 0  . ibuprofen (ADVIL,MOTRIN) 200 MG tablet Take 200 mg by mouth every 8 (eight) hours as needed.    Marland Kitchen lisinopril (PRINIVIL,ZESTRIL) 5 MG tablet TAKE 1 TABLET EVERY DAY 90 tablet 0  . meloxicam (MOBIC) 15 MG tablet Take 1/2 to 1 whole tab daily PRN 90 tablet 1  . pramipexole (MIRAPEX) 0.25 MG tablet TAKE 1 TABLET AT BEDTIME 90 tablet 0  . ranitidine (ZANTAC) 150 MG tablet TAKE 1 TABLET TWICE DAILY 180 tablet 1  . traZODone (DESYREL) 50 MG tablet TAKE 1 TABLET AT BEDTIME 90 tablet 0  . Vitamin D, Ergocalciferol, (DRISDOL) 50000 units CAPS capsule TAKE 1 CAPSULE EVERY 7 DAYS. 12 capsule 3   No current facility-administered medications for this visit.     Allergies:   Hydrocodone-homatropine; Rosuvastatin; and Sulfa antibiotics    Social History:  The patient  reports that she quit smoking about 7 years ago. Her smoking use included Cigarettes. She has never used smokeless tobacco. She reports that she does not drink alcohol or use drugs.   Family History:  The patient's family history  includes Cancer in her sister; Congestive Heart Failure in her father; Heart attack (age of onset: 50) in her mother; Heart disease in her father; Hyperlipidemia in her sister; Hypertension in her father, mother, and sister.    ROS:  Please see the history of present illness.   Otherwise, review of systems are positive for none.   All other systems are reviewed and negative.    PHYSICAL EXAM: VS:  BP 122/70 (BP Location: Right Arm, Patient Position: Sitting, Cuff Size: Large)   Pulse 73   Ht 5' 2.5" (1.588 m)   Wt 183 lb (83 kg)   BMI 32.94 kg/m  , BMI Body mass index is 32.94 kg/m. GENERAL:  Well appearing HEENT:  Pupils equal round and  reactive, fundi not visualized, oral mucosa unremarkable NECK:  No jugular venous distention, waveform within normal limits, carotid upstroke brisk and symmetric, no bruits, no thyromegaly LYMPHATICS:  No cervical, inguinal adenopathy LUNGS:  Clear to auscultation bilaterally BACK:  No CVA tenderness CHEST:  Unremarkable HEART:  PMI not displaced or sustained,S1 and S2 within normal limits, no S3, no S4, no clicks, no rubs, no murmurs ABD:  Flat, positive bowel sounds normal in frequency in pitch, no bruits, no rebound, no guarding, no midline pulsatile mass, no hepatomegaly, no splenomegaly EXT:  2 plus pulses throughout, no edema, no cyanosis no clubbing SKIN:  No rashes no nodules NEURO:  Cranial nerves II through XII grossly intact, motor grossly intact throughout PSYCH:  Cognitively intact, oriented to person place and time    EKG:  EKG is ordered today. The ekg ordered today demonstrates sinus rhythm, rate 73, axis within normal limits, intervals within normal limits, no acute ST-T wave changes.   Recent Labs: 08/02/2016: ALT 22; BUN 13; Creatinine, Ser 0.82; Platelets 235; Potassium 4.3; Sodium 139    Lipid Panel    Component Value Date/Time   CHOL 181 08/02/2016 1038   CHOL 206 (H) 11/15/2015 1611   CHOL 178 11/11/2012 1647   TRIG 162 (H) 08/02/2016 1038   TRIG 252 (H) 11/11/2012 1647   HDL 47 08/02/2016 1038   HDL 51 11/11/2012 1647   CHOLHDL 4.3 11/15/2015 1611   LDLCALC 97 11/15/2015 1611   LDLCALC 221 (H) 01/21/2014 1626   LDLCALC 77 11/11/2012 1647      Wt Readings from Last 3 Encounters:  08/31/16 183 lb (83 kg)  08/03/16 183 lb (83 kg)  08/02/16 183 lb (83 kg)       Other studies Reviewed: Additional studies/ records that were reviewed today include: None. Review of the above records demonstrates:  Please see elsewhere in the note.     ASSESSMENT AND PLAN:  CHEST PAIN:  She does have cardiovascular risk factors. Given that she needs to be screened.  I will bring the patient back for a POET (Plain Old Exercise Test). This will allow me to screen for obstructive coronary disease, risk stratify and very importantly provide a prescription for exercise.  DM:    Her blood sugar is not well controlled.  We talked about this.  I will defer to Redge Gainer, MD   DM:  Her LDL is 102.  She will remain on the meds as listed.    HTN:  The blood pressure is at target. No change in medications is indicated. We will continue with therapeutic lifestyle changes (TLC).   Current medicines are reviewed at length with the patient today.  The patient does not have concerns regarding medicines.  The following changes have been made:  no change  Labs/ tests ordered today include:   Orders Placed This Encounter  Procedures  . EXERCISE TOLERANCE TEST  . EKG 12-Lead     Disposition:   FU with me as needed.     Signed, Minus Breeding, MD  08/31/2016 1:05 PM    [facility]

## 2016-08-31 ENCOUNTER — Ambulatory Visit (INDEPENDENT_AMBULATORY_CARE_PROVIDER_SITE_OTHER): Payer: Medicare PPO | Admitting: Cardiology

## 2016-08-31 ENCOUNTER — Encounter: Payer: Self-pay | Admitting: Cardiology

## 2016-08-31 ENCOUNTER — Telehealth: Payer: Self-pay | Admitting: Family Medicine

## 2016-08-31 VITALS — BP 122/70 | HR 73 | Ht 62.5 in | Wt 183.0 lb

## 2016-08-31 DIAGNOSIS — R079 Chest pain, unspecified: Secondary | ICD-10-CM | POA: Diagnosis not present

## 2016-08-31 NOTE — Telephone Encounter (Signed)
Please have patient try taking 300 mg twice daily if this does not help with the reflux symptoms we will have to change her back to a proton pump inhibitor Call prescription in for 300 mg one twice daily before breakfast and supper

## 2016-08-31 NOTE — Telephone Encounter (Signed)
No more samples of Trulicity currently but will check again next week.   Patient also asked for rx sent to mail order for ranitidine 150mg  with directions of 2 tablets or 300mg  bid.  Max daily dose of ranitidine is 300mg  per day.  She states she tried taking just 150mg  bid and had increase GERD sx's.  Will forward to her PCP.  Might need to change to PPI for a few months - consider paptoprazole or omeprazole.

## 2016-08-31 NOTE — Patient Instructions (Signed)

## 2016-09-01 MED ORDER — ESOMEPRAZOLE MAGNESIUM 40 MG PO CPDR: 40.0000 mg | DELAYED_RELEASE_CAPSULE | Freq: Every day | ORAL | 0 refills | Status: DC

## 2016-09-01 MED ORDER — ESOMEPRAZOLE MAGNESIUM 40 MG PO CPDR: 40.0000 mg | DELAYED_RELEASE_CAPSULE | Freq: Every day | ORAL | 3 refills | Status: DC

## 2016-09-01 NOTE — Telephone Encounter (Signed)
Start Nexium 40 mg once daily before breakfast in the morning

## 2016-09-01 NOTE — Telephone Encounter (Signed)
She has already been doing the 300 bid and her insurance will not cover it.  Theresa Wilson reviewed and thinks we should go ahead and try PPI

## 2016-09-01 NOTE — Addendum Note (Signed)
Addended by: Zannie Cove on: 09/01/2016 05:26 PM   Modules accepted: Orders

## 2016-09-14 ENCOUNTER — Telehealth: Payer: Self-pay | Admitting: Family Medicine

## 2016-09-14 NOTE — Telephone Encounter (Signed)
Sample of Trulicity given to her daughter - ok per patient.

## 2016-09-19 ENCOUNTER — Telehealth (HOSPITAL_COMMUNITY): Payer: Self-pay

## 2016-09-19 NOTE — Telephone Encounter (Signed)
Encounter complete. 

## 2016-09-20 ENCOUNTER — Encounter: Payer: Self-pay | Admitting: Pharmacist

## 2016-09-20 ENCOUNTER — Ambulatory Visit (INDEPENDENT_AMBULATORY_CARE_PROVIDER_SITE_OTHER): Payer: Medicare PPO | Admitting: Pharmacist

## 2016-09-20 VITALS — BP 116/68 | HR 70 | Ht 63.0 in | Wt 184.0 lb

## 2016-09-20 DIAGNOSIS — Z1159 Encounter for screening for other viral diseases: Secondary | ICD-10-CM | POA: Diagnosis not present

## 2016-09-20 DIAGNOSIS — Z0000 Encounter for general adult medical examination without abnormal findings: Secondary | ICD-10-CM

## 2016-09-20 DIAGNOSIS — R252 Cramp and spasm: Secondary | ICD-10-CM | POA: Diagnosis not present

## 2016-09-20 DIAGNOSIS — E119 Type 2 diabetes mellitus without complications: Secondary | ICD-10-CM | POA: Diagnosis not present

## 2016-09-20 DIAGNOSIS — F1920 Other psychoactive substance dependence, uncomplicated: Secondary | ICD-10-CM | POA: Diagnosis not present

## 2016-09-20 DIAGNOSIS — Z7289 Other problems related to lifestyle: Secondary | ICD-10-CM | POA: Diagnosis not present

## 2016-09-20 LAB — BAYER DCA HB A1C WAIVED: HB A1C: 7.5 % — AB (ref <7.0)

## 2016-09-20 NOTE — Progress Notes (Signed)
Patient ID: Theresa Wilson, female   DOB: [birth year], 71 y.o.   MRN: [id number]     Subjective:   Theresa Wilson is a 71 y.o. female who presents for a subsequent Medicare Annual Wellness Visit and to recheck DM.  At our last visit Ms. Mezquita restarted Trulicity 0.75mg  weekly.  She report BG is betters but still in the 140's at home.  Her last A1c was 9.3% (08/02/2016).  She is tolerating Trulicity well.   Patient c/o leg weakness / cramping from time to time.   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  She is planning to retire in May 2018 and care for her new grandson.  Marital history: divorced 2 daughters and 4 grandsons Alcohol/Tobacco/Substances: not current but history of greater then 30 pack year smoking history.    Current Medications (verified) Outpatient Encounter Prescriptions as of 09/20/2016  Medication Sig  . ACCU-CHEK AVIVA PLUS test strip TEST BLOOD SUGAR TWICE A DAY   . ACCU-CHEK SOFTCLIX LANCETS lancets TEST BLOOD SUGAR TWICE A DAY  . acetaminophen (TYLENOL) 500 MG tablet Take 500 mg by mouth every 6 (six) hours as needed. Reported on 07/09/2015  . aspirin 81 MG EC tablet Take 1 tablet (81 mg total) by mouth daily. Swallow whole.  Marland Kitchen atorvastatin (LIPITOR) 40 MG tablet Take 1 tablet (40 mg total) by mouth daily.  Marland Kitchen esomeprazole (NEXIUM) 40 MG capsule Take 1 capsule (40 mg total) by mouth daily.  Marland Kitchen glimepiride (AMARYL) 4 MG tablet Take 2 tablets (8 mg total) by mouth daily before breakfast. (Patient taking differently: Take 4 mg by mouth 2 (two) times daily. )  . ibuprofen (ADVIL,MOTRIN) 200 MG tablet Take 200 mg by mouth every 8 (eight) hours as needed.  Marland Kitchen lisinopril (PRINIVIL,ZESTRIL) 5 MG tablet TAKE 1 TABLET EVERY DAY  . meloxicam (MOBIC) 15 MG tablet Take 1/2 to 1 whole tab daily PRN  . pramipexole (MIRAPEX) 0.25 MG tablet TAKE 1 TABLET AT BEDTIME  . traZODone (DESYREL) 50 MG  tablet TAKE 1 TABLET AT BEDTIME  . Triamcinolone Acetonide (NASACORT ALLERGY 24HR NA) Place 1 spray into the nose 2 (two) times daily.  . Vitamin D, Ergocalciferol, (DRISDOL) 50000 units CAPS capsule TAKE 1 CAPSULE EVERY 7 DAYS.  . [DISCONTINUED] Dulaglutide (TRULICITY) A999333 0000000 SOPN Inject 1 syringeful once weekly on Thursdays  . Dulaglutide (TRULICITY) 1.5 0000000 SOPN Inject 1.5 mg into the skin every 7 (seven) days.  . [DISCONTINUED] fluticasone (FLONASE) 50 MCG/ACT nasal spray Place 1 spray into both nostrils daily.  . [DISCONTINUED] ranitidine (ZANTAC) 150 MG tablet Take 150 mg by mouth 2 (two) times daily.   No facility-administered encounter medications on file as of 09/20/2016.     Allergies (verified) Hydrocodone-homatropine; Rosuvastatin; and Sulfa antibiotics   History: Past Medical History:  Diagnosis Date  . Allergy   . Aneurysm of splenic artery (HCC)   . Cataract   . Diabetes mellitus without complication ([place])   . Hyperlipidemia   . Hypertension   . Restless leg syndrome    Past Surgical History:  Procedure Laterality Date  . OVARIAN CYST REMOVAL     Family History  Problem Relation Age of Onset  . Heart attack Mother 35    Died age 17  . Hypertension Mother   . Hypertension Father   . Heart disease Father   . Congestive Heart Failure Father     Lived to age  95  . Cancer Sister     lung  . Hyperlipidemia Sister   . Hypertension Sister    Social History   Occupational History  . Housekeeper    Social History Main Topics  . Smoking status: Former Smoker    Packs/day: 0.50    Years: 45.00    Types: Cigarettes    Quit date: 11/11/2008  . Smokeless tobacco: Never Used  . Alcohol use No     Comment: occassional  . Drug use: No  . Sexual activity: Not Currently    Do you feel safe at home?  Yes Are there smokers in your home (other than you)? No  Dietary issues and exercise activities: Current Exercise Habits: The patient has a physically  strenous job, but has no regular exercise apart from work.  Current Dietary habits:  Serving sizes are smaller since starting Trulicity.  She tries to follow CHO counting ADA diet.   Objective:    Today's Vitals   09/20/16 1137  BP: 116/68  Pulse: 70  Weight: 184 lb (83.5 kg)  Height: 5\' 3"  (1.6 m)  PainSc: 2   PainLoc: Shoulder   Body mass index is 32.59 kg/m.   A1c was 7.5% today (down from 9.3% 07/24/2016)  Activities of Daily Living In your present state of health, do you have any difficulty performing the following activities: 09/20/2016  Hearing? N  Vision? N  Difficulty concentrating or making decisions? N  Walking or climbing stairs? Y  Dressing or bathing? N  Doing errands, shopping? N  Preparing Food and eating ? N  Using the Toilet? N  In the past six months, have you accidently leaked urine? N  Do you have problems with loss of bowel control? N  Managing your Medications? N  Managing your Finances? N  Housekeeping or managing your Housekeeping? N  Some recent data might be hidden     Cardiac Risk Factors include: advanced age (>37men, >90 women);diabetes mellitus;dyslipidemia;family history of premature cardiovascular disease;hypertension;microalbuminuria;obesity (BMI >30kg/m2);sedentary lifestyle  Depression Screen PHQ 2/9 Scores 09/20/2016 08/02/2016 03/28/2016 11/15/2015  PHQ - 2 Score 2 0 0 0  PHQ- 9 Score 3 - - -     Fall Risk Fall Risk  09/20/2016 08/02/2016 03/28/2016 11/15/2015 08/20/2015  Falls in the past year? No No No No No    Cognitive Function: MMSE - Mini Mental State Exam 09/20/2016 08/20/2015  Orientation to time 5 5  Orientation to Place 5 5  Registration 3 3  Attention/ Calculation 5 4  Recall 3 3  Language- name 2 objects 2 2  Language- repeat 1 1  Language- follow 3 step command 3 3  Language- read & follow direction 1 1  Write a sentence 1 1  Copy design 1 1  Total score 30 29    Immunizations and Health Maintenance Immunization  History  Administered Date(s) Administered  . Influenza,inj,Quad PF,36+ Mos 04/22/2014  . Influenza-Unspecified 04/23/2013, 05/02/2016  . Pneumococcal Conjugate-13 07/15/2013  . Pneumococcal Polysaccharide-23 08/20/2015   Health Maintenance Due  Topic Date Due  . OPHTHALMOLOGY EXAM  11/10/2015  . PAP SMEAR  05/29/2016    Patient Care Team: Chipper Herb, MD as PCP - General (Family Medicine)  Indicate any recent Medical Services you may have received from other than [facility] providers in the past year (date may be approximate).    Assessment:    Annual Wellness Visit  Type 2 DM - improving control   Screening Tests Health Maintenance  Topic  Date Due  . OPHTHALMOLOGY EXAM  11/10/2015  . PAP SMEAR  05/29/2016  . HEMOGLOBIN A1C  03/20/2017  . TETANUS/TDAP  07/24/2017  . FOOT EXAM  08/02/2017  . MAMMOGRAM  11/14/2017  . COLONOSCOPY  07/24/2018  . DEXA SCAN  08/02/2021  . INFLUENZA VACCINE  Addressed  . Hepatitis C Screening  Completed  . PNA vac Low Risk Adult  Completed        Plan:   During the course of the visit Agata was educated and counseled about the following appropriate screening and preventive services:   Vaccines to include Pneumoccal, Influenza, Td, Zostavax - Vaccines currently UTD  Colorectal cancer screening - UTD  Cardiovascular disease screening - having POET tomorrow  Lipids are at goal and patient is taking statin - atorvastatin 40mg  daily  BP is at goal today  Diabetes - Increase Trulicity to 1.5mg  weekly (patient given sample of Bydureon because no Trulicity today - we are trying to get her through with samples until she retires and then she will apply for LIS.  Current copy for Trulicity is tier 3 and too high / Bydureon is tier 4)  Bone Denisty / Osteoporosis Screening - UTD  Mammogram - UTD  PAP - app scheduled today with Chevis Pretty, NP  Glaucoma screening / Diabetic Eye Exam - UTD; patient has brought in copy of report from  Dr Carmina Miller in [place].  Has not been scanned into records yet.  Nutrition counseling - continue to limit serving sizes and CHO intake  Lung Cancer Screening - order for repeat CT lung orders - patient preferred [place] if possible  Advanced Directives - discussed and patient declined information  Physical Activity - discussed increase exercise (espicailly when retires)  Checking magnesium and b12 due to leg cramps today  Orders Placed This Encounter  Procedures  . Bayer DCA Hb A1c Waived  . Anemia Profile B  . Magnesium  . Hepatitis C antibody     Patient Instructions (the written plan) were given to the patient.   Cherre Robins, PharmD   09/22/2016

## 2016-09-20 NOTE — Patient Instructions (Addendum)
  Ms. Theresa Wilson , Thank you for taking time to come for your Medicare Wellness Visit. I appreciate your ongoing commitment to your health goals. Please review the following plan we discussed and let me know if I can assist you in the future.   These are the goals we discussed: Start exercise - goal is 150 minutes per week.   Increase non-starchy vegetables - carrots, green bean, squash, zucchini, tomatoes, onions, peppers, spinach and other green leafy vegetables, cabbage, lettuce, cucumbers, asparagus, okra (not fried), eggplant Limit sugar and processed foods (cakes, cookies, ice cream, crackers and chips) Increase fresh fruit but limit serving sizes 1/2 cup or about the size of tennis or baseball Limit red meat to no more than 1-2 times per week (serving size about the size of your palm) Choose whole grains / lean proteins - whole wheat bread, quinoa, whole grain rice (1/2 cup), fish, chicken, [place] Avoid sugar and calorie containing beverages - soda, sweet tea and juice.  Choose water or unsweetened tea instead.   This is a list of the screening recommended for you and due dates:  Health Maintenance  Topic Date Due  .  Hepatitis C: One time screening is recommended by Center for Disease Control  (CDC) for  adults born from [birth year] through 1963.   11/03/1945  . Eye exam for diabetics  08/2017  . Pap Smear  05/29/2016  . Hemoglobin A1C  01/30/2017  . Tetanus Vaccine  07/24/2017  . Complete foot exam   08/02/2017  . Mammogram  11/14/2017  . Colon Cancer Screening  07/24/2018  . DEXA scan (bone density measurement)  08/02/2021  . Flu Shot  Addressed  . Pneumonia vaccines  Completed

## 2016-09-21 ENCOUNTER — Ambulatory Visit (HOSPITAL_COMMUNITY)
Admission: RE | Admit: 2016-09-21 | Discharge: 2016-09-21 | Disposition: A | Discharge status: Home / Self Care | Payer: Medicare PPO | Source: Ambulatory Visit | Attending: Cardiology | Admitting: Cardiology

## 2016-09-21 DIAGNOSIS — R079 Chest pain, unspecified: Secondary | ICD-10-CM | POA: Diagnosis not present

## 2016-09-21 LAB — ANEMIA PROFILE B
Basophils Absolute: 0 10*3/uL (ref 0.0–0.2)
Basos: 0 %
EOS (ABSOLUTE): 0.2 10*3/uL (ref 0.0–0.4)
EOS: 3 %
FOLATE: 6.4 ng/mL (ref >3.0)
Ferritin: 143 ng/mL (ref 15–150)
HEMOGLOBIN: 13.5 g/dL (ref 11.1–15.9)
Hematocrit: 40.2 % (ref 34.0–46.6)
IRON SATURATION: 20 % (ref 15–55)
IRON: 59 ug/dL (ref 27–139)
Immature Grans (Abs): 0 10*3/uL (ref 0.0–0.1)
Immature Granulocytes: 0 %
LYMPHS ABS: 1.7 10*3/uL (ref 0.7–3.1)
Lymphs: 22 %
MCH: 30.1 pg (ref 26.6–33.0)
MCHC: 33.6 g/dL (ref 31.5–35.7)
MCV: 90 fL (ref 79–97)
MONOS ABS: 0.5 10*3/uL (ref 0.1–0.9)
Monocytes: 6 %
NEUTROS ABS: 5.2 10*3/uL (ref 1.4–7.0)
Neutrophils: 69 %
Platelets: 240 10*3/uL (ref 150–379)
RBC: 4.49 x10E6/uL (ref 3.77–5.28)
RDW: 14 % (ref 12.3–15.4)
Retic Ct Pct: 1.8 % (ref 0.6–2.6)
TIBC: 300 ug/dL (ref 250–450)
UIBC: 241 ug/dL (ref 118–369)
VITAMIN B 12: 200 pg/mL — AB (ref 232–1245)
WBC: 7.6 10*3/uL (ref 3.4–10.8)

## 2016-09-21 LAB — EXERCISE TOLERANCE TEST
Estimated workload: 6.2 METS
Exercise duration (min): 4 min
Exercise duration (sec): 21 s
MPHR: 152 {beats}/min
Peak HR: 130 {beats}/min
Percent HR: 85 %
RPE: 18
Rest HR: 82 {beats}/min

## 2016-09-21 LAB — HEPATITIS C ANTIBODY: Hep C Virus Ab: <0.1 s/co ratio (ref 0.0–0.9)

## 2016-09-21 LAB — MAGNESIUM: Magnesium: 1.8 mg/dL (ref 1.6–2.3)

## 2016-09-27 ENCOUNTER — Other Ambulatory Visit: Payer: Self-pay | Admitting: Family Medicine

## 2016-09-27 ENCOUNTER — Other Ambulatory Visit: Payer: Self-pay | Admitting: Pharmacist

## 2016-09-27 MED ORDER — "SYRINGE 25G X 1"" 3 ML MISC": 0 refills | Status: DC

## 2016-09-27 MED ORDER — CYANOCOBALAMIN 1000 MCG/ML IJ KIT: PACK | INTRAMUSCULAR | 1 refills | Status: DC

## 2016-09-27 NOTE — Telephone Encounter (Signed)
Rx for b12 and needles sent to [facility] for patient.  Her daughter will administer for her - 40mL  IM q week for 4 weeks, then 17mL monthly thereafter.

## 2016-09-28 ENCOUNTER — Other Ambulatory Visit: Payer: Self-pay | Admitting: Family Medicine

## 2016-09-28 DIAGNOSIS — R918 Other nonspecific abnormal finding of lung field: Secondary | ICD-10-CM

## 2016-09-30 DIAGNOSIS — M25511 Pain in right shoulder: Secondary | ICD-10-CM | POA: Diagnosis not present

## 2016-10-04 ENCOUNTER — Telehealth: Payer: Self-pay | Admitting: Family Medicine

## 2016-10-04 NOTE — Telephone Encounter (Signed)
No samples of Trulicilty 1.5 but I did leave her #4 samples of 0.75mg .  Patient notified.

## 2016-10-17 ENCOUNTER — Ambulatory Visit (HOSPITAL_COMMUNITY)
Admission: RE | Admit: 2016-10-17 | Discharge: 2016-10-17 | Disposition: A | Discharge status: Home / Self Care | Payer: Medicare PPO | Source: Ambulatory Visit | Attending: Family Medicine | Admitting: Family Medicine

## 2016-10-17 DIAGNOSIS — Z87891 Personal history of nicotine dependence: Secondary | ICD-10-CM | POA: Insufficient documentation

## 2016-10-17 DIAGNOSIS — F17210 Nicotine dependence, cigarettes, uncomplicated: Secondary | ICD-10-CM | POA: Diagnosis not present

## 2016-10-17 DIAGNOSIS — I2510 Atherosclerotic heart disease of native coronary artery without angina pectoris: Secondary | ICD-10-CM | POA: Insufficient documentation

## 2016-10-17 DIAGNOSIS — I700 Atherosclerosis of aorta: Secondary | ICD-10-CM | POA: Diagnosis not present

## 2016-10-17 DIAGNOSIS — R918 Other nonspecific abnormal finding of lung field: Secondary | ICD-10-CM

## 2016-10-17 DIAGNOSIS — I728 Aneurysm of other specified arteries: Secondary | ICD-10-CM | POA: Insufficient documentation

## 2016-10-18 ENCOUNTER — Encounter: Payer: Self-pay | Admitting: Nurse Practitioner

## 2016-10-18 ENCOUNTER — Ambulatory Visit (INDEPENDENT_AMBULATORY_CARE_PROVIDER_SITE_OTHER): Payer: Medicare PPO | Admitting: Nurse Practitioner

## 2016-10-18 VITALS — BP 117/75 | HR 88 | Temp 97.2°F | Ht 63.0 in | Wt 184.0 lb

## 2016-10-18 DIAGNOSIS — Z01419 Encounter for gynecological examination (general) (routine) without abnormal findings: Secondary | ICD-10-CM | POA: Diagnosis not present

## 2016-10-18 DIAGNOSIS — Z0000 Encounter for general adult medical examination without abnormal findings: Secondary | ICD-10-CM | POA: Diagnosis not present

## 2016-10-18 LAB — URINALYSIS, COMPLETE
Bilirubin, UA: NEGATIVE
GLUCOSE, UA: NEGATIVE
Ketones, UA: NEGATIVE
Leukocytes, UA: NEGATIVE
NITRITE UA: NEGATIVE
Protein, UA: NEGATIVE
Specific Gravity, UA: 1.02 (ref 1.005–1.030)
UUROB: 1 mg/dL (ref 0.2–1.0)
pH, UA: 6 (ref 5.0–7.5)

## 2016-10-18 LAB — MICROSCOPIC EXAMINATION
Epithelial Cells (non renal): >10 /hpf — AB (ref 0–10)
Renal Epithel, UA: NONE SEEN /hpf

## 2016-10-18 NOTE — Progress Notes (Signed)
   Subjective:    Patient ID: Theresa Wilson, female    DOB: [birth year], 71 y.o.   MRN: [id number]  HPI Theresa Wilson is a regular patient of Dr. Laurance Flatten who is in today for pap and breast exam only. She last saw Dr. Laurance Flatten on 08/31/16. SHe is dong wewlll today without complaints. SH ehad a stress test earlier this month whichshe says turned out good.  C/O right shoulder pain-rates 3/10- pain increases with  Movement- went to urgent care and was given steroid pack- did not help  Review of Systems  Constitutional: Negative.   HENT: Negative.   Respiratory: Negative.   Cardiovascular: Negative.   Genitourinary: Negative.   Neurological: Negative.   Psychiatric/Behavioral: Negative.   All other systems reviewed and are negative.      Objective:   Physical Exam  Constitutional: She is oriented to person, place, and time. She appears well-developed and well-nourished.  HENT:  Head: Normocephalic.  Right Ear: Hearing, tympanic membrane, external ear and ear canal normal.  Left Ear: Hearing, tympanic membrane, external ear and ear canal normal.  Nose: Nose normal.  Mouth/Throat: Uvula is midline and oropharynx is clear and moist.  Eyes: Conjunctivae and EOM are normal. Pupils are equal, round, and reactive to light.  Neck: Normal range of motion and full passive range of motion without pain. Neck supple. No JVD present. Carotid bruit is not present. No thyroid mass and no thyromegaly present.  Cardiovascular: Normal rate, normal heart sounds and intact distal pulses.   No murmur heard. Pulmonary/Chest: Effort normal and breath sounds normal. Right breast exhibits no inverted nipple, no mass, no nipple discharge, no skin change and no tenderness. Left breast exhibits no inverted nipple, no mass, no nipple discharge, no skin change and no tenderness.  Abdominal: Soft. Bowel sounds are normal. She exhibits no mass. There is no tenderness.  Genitourinary: Uterus normal. No breast swelling,  tenderness, discharge or bleeding. Vaginal discharge found.  Genitourinary Comments: bimanual exam-No adnexal masses or tenderness. Cervix parous and pink  Musculoskeletal: Normal range of motion.  Lymphadenopathy:    She has no cervical adenopathy.  Neurological: She is alert and oriented to person, place, and time.  Skin: Skin is warm and dry.  Psychiatric: She has a normal mood and affect. Her behavior is normal. Judgment and thought content normal.   BP 117/75   Pulse 88   Temp 97.2 F (36.2 C) (Oral)   Ht 5\' 3"  (1.6 m)   Wt 184 lb (83.5 kg)   BMI 32.59 kg/m      Assessment & Plan:  1. Gynecologic exam normal Keep follow up appointment with Dr. Laurance Flatten - Urinalysis, Complete  Mary-Margaret Hassell Done, FNP

## 2016-10-18 NOTE — Addendum Note (Signed)
Addended by: Chevis Pretty on: 10/18/2016 12:03 PM   Modules accepted: Orders

## 2016-10-20 DIAGNOSIS — G4733 Obstructive sleep apnea (adult) (pediatric): Secondary | ICD-10-CM | POA: Diagnosis not present

## 2016-10-20 LAB — PAP IG (IMAGE GUIDED): PAP SMEAR COMMENT: 0

## 2016-10-29 ENCOUNTER — Other Ambulatory Visit: Payer: Self-pay | Admitting: Family Medicine

## 2016-11-06 ENCOUNTER — Telehealth: Payer: Self-pay | Admitting: Family Medicine

## 2016-11-07 NOTE — Telephone Encounter (Signed)
Left #2 samples of Trulicity 0.75mg  (did not have 1.5mg  strength)

## 2016-11-13 ENCOUNTER — Telehealth: Payer: Self-pay | Admitting: Pharmacist

## 2016-11-13 ENCOUNTER — Other Ambulatory Visit: Payer: Self-pay | Admitting: Family Medicine

## 2016-11-13 MED ORDER — DULAGLUTIDE 1.5 MG/0.5ML ~~LOC~~ SOAJ: 1.5000 mg | SUBCUTANEOUS | 0 refills | Status: DC

## 2016-11-13 NOTE — Telephone Encounter (Signed)
#  2 boxes of samples left for patient's daughter to pick up and patient notified.

## 2016-12-07 DIAGNOSIS — R0683 Snoring: Secondary | ICD-10-CM | POA: Diagnosis not present

## 2016-12-07 DIAGNOSIS — R0902 Hypoxemia: Secondary | ICD-10-CM | POA: Diagnosis not present

## 2016-12-07 DIAGNOSIS — Z6833 Body mass index (BMI) 33.0-33.9, adult: Secondary | ICD-10-CM | POA: Diagnosis not present

## 2016-12-07 DIAGNOSIS — E669 Obesity, unspecified: Secondary | ICD-10-CM | POA: Diagnosis not present

## 2016-12-07 DIAGNOSIS — G4710 Hypersomnia, unspecified: Secondary | ICD-10-CM | POA: Diagnosis not present

## 2016-12-07 DIAGNOSIS — G4761 Periodic limb movement disorder: Secondary | ICD-10-CM | POA: Diagnosis not present

## 2016-12-07 DIAGNOSIS — G4733 Obstructive sleep apnea (adult) (pediatric): Secondary | ICD-10-CM | POA: Diagnosis not present

## 2016-12-07 DIAGNOSIS — R51 Headache: Secondary | ICD-10-CM | POA: Diagnosis not present

## 2016-12-11 ENCOUNTER — Other Ambulatory Visit: Payer: Self-pay | Admitting: Family Medicine

## 2016-12-14 ENCOUNTER — Telehealth: Payer: Self-pay | Admitting: Family Medicine

## 2016-12-15 MED ORDER — DULAGLUTIDE 1.5 MG/0.5ML ~~LOC~~ SOAJ: 1.5000 mg | SUBCUTANEOUS | 0 refills | Status: DC

## 2016-12-15 NOTE — Telephone Encounter (Signed)
#  2 pens of Trulicity 1.5mg  left for patient to pick up.

## 2016-12-22 ENCOUNTER — Ambulatory Visit: Payer: Medicare PPO | Admitting: Family Medicine

## 2016-12-22 ENCOUNTER — Other Ambulatory Visit: Payer: Self-pay | Admitting: Family Medicine

## 2017-01-04 ENCOUNTER — Telehealth: Payer: Self-pay | Admitting: Family Medicine

## 2017-01-04 MED ORDER — DULAGLUTIDE 1.5 MG/0.5ML ~~LOC~~ SOAJ: 1.5000 mg | SUBCUTANEOUS | 0 refills | Status: DC

## 2017-01-04 NOTE — Telephone Encounter (Signed)
Pt aware sample is ready for her to pickup

## 2017-01-05 ENCOUNTER — Ambulatory Visit (INDEPENDENT_AMBULATORY_CARE_PROVIDER_SITE_OTHER): Payer: Medicare PPO | Admitting: Family Medicine

## 2017-01-05 ENCOUNTER — Encounter: Payer: Self-pay | Admitting: Family Medicine

## 2017-01-05 VITALS — BP 115/71 | HR 93 | Temp 97.1°F | Ht 63.0 in | Wt 177.0 lb

## 2017-01-05 DIAGNOSIS — G4730 Sleep apnea, unspecified: Secondary | ICD-10-CM

## 2017-01-05 DIAGNOSIS — E119 Type 2 diabetes mellitus without complications: Secondary | ICD-10-CM | POA: Diagnosis not present

## 2017-01-05 DIAGNOSIS — E66811 Obesity, class 1: Secondary | ICD-10-CM

## 2017-01-05 DIAGNOSIS — R938 Abnormal findings on diagnostic imaging of other specified body structures: Secondary | ICD-10-CM

## 2017-01-05 DIAGNOSIS — G4733 Obstructive sleep apnea (adult) (pediatric): Secondary | ICD-10-CM | POA: Diagnosis not present

## 2017-01-05 DIAGNOSIS — E559 Vitamin D deficiency, unspecified: Secondary | ICD-10-CM | POA: Diagnosis not present

## 2017-01-05 DIAGNOSIS — K219 Gastro-esophageal reflux disease without esophagitis: Secondary | ICD-10-CM

## 2017-01-05 DIAGNOSIS — E7800 Pure hypercholesterolemia, unspecified: Secondary | ICD-10-CM

## 2017-01-05 DIAGNOSIS — E669 Obesity, unspecified: Secondary | ICD-10-CM | POA: Diagnosis not present

## 2017-01-05 DIAGNOSIS — W57XXXA Bitten or stung by nonvenomous insect and other nonvenomous arthropods, initial encounter: Secondary | ICD-10-CM

## 2017-01-05 DIAGNOSIS — I700 Atherosclerosis of aorta: Secondary | ICD-10-CM | POA: Diagnosis not present

## 2017-01-05 DIAGNOSIS — I10 Essential (primary) hypertension: Secondary | ICD-10-CM

## 2017-01-05 DIAGNOSIS — I728 Aneurysm of other specified arteries: Secondary | ICD-10-CM

## 2017-01-05 DIAGNOSIS — R9389 Abnormal findings on diagnostic imaging of other specified body structures: Secondary | ICD-10-CM

## 2017-01-05 LAB — BAYER DCA HB A1C WAIVED: HB A1C: 7.1 % — AB (ref <7.0)

## 2017-01-05 MED ORDER — BLOOD GLUCOSE MONITOR SYSTEM W/DEVICE KIT: PACK | 1 refills | Status: DC

## 2017-01-05 NOTE — Patient Instructions (Addendum)
Medicare Annual Wellness Visit  [facility] and the medical providers at [facility] strive to bring you the best medical care.  In doing so we not only want to address your current medical conditions and concerns but also to detect new conditions early and prevent illness, disease and health-related problems.    Medicare offers a yearly Wellness Visit which allows our clinical staff to assess your need for preventative services including immunizations, lifestyle education, counseling to decrease risk of preventable diseases and screening for fall risk and other medical concerns.    This visit is provided free of charge (no copay) for all Medicare recipients. The clinical pharmacists at [facility] have begun to conduct these Wellness Visits which will also include a thorough review of all your medications.    As you primary medical provider recommend that you make an appointment for your Annual Wellness Visit if you have not done so already this year.  You may set up this appointment before you leave today or you may call back [phone] and schedule an appointment.  Please make sure when you call that you mention that you are scheduling your Annual Wellness Visit with the clinical pharmacist so that the appointment may be made for the proper length of time.     Continue current medications. Continue good therapeutic lifestyle changes which include good diet and exercise. Fall precautions discussed with patient. If an FOBT was given today- please return it to our front desk. If you are over 42 years old - you may need Prevnar 61 or the adult Pneumonia vaccine.  **Flu shots are available--- please call and schedule a FLU-CLINIC appointment**  After your visit with [place] today you will receive a survey in the mail or online from [employer] regarding your care with [place]. Please take a moment to fill this out. Your feedback is very  important to [place] as you can help [place] better understand your patient needs as well as improve your experience and satisfaction. WE CARE ABOUT YOU!!!   Do not forget to get your annual CT scan every spring by April 1. We will need to reevaluate the lungs the splenic artery aneurysm and the aortic atherosclerosis. Continue with aggressive therapeutic lifestyle changes weight loss and good blood sugar control We will call with lab work results as soon as these results become available Every time you get your eye exam please make sure that they send [place] a copy of the report for our records Continue to monitor blood sugars closely at home and the summer drink plenty of water and stay well hydrated

## 2017-01-05 NOTE — Progress Notes (Signed)
Subjective:    Patient ID: Theresa Wilson, female    DOB: 14-Aug-1945, 71 y.o.   MRN: [id number]  HPI Pt here for follow up and management of chronic medical problems which includes diabetes and hyperlipidemia. She is taking medication regularly.The patient is doing well overall. She is helping take care of her grandchildren. She is lost some weight from 184 pounds down 277 pounds through better diet habits. She denies any chest pain or shortness of breath. She denies any trouble with her stomach other than some occasional nausea and she attributes some of that to not eating is much. She does take omeprazole in the morning and she will try taking some Zantac before dinner at nighttime. She is passing her water without problems. She did see the eye doctor at [facility] in [place] in January of this year. She says she brought a copy of that report to this office. She will be given an FOBT to return. X colonoscopy is not due until 2020. She says her blood sugars at home have been running fasting between 85 and 110. She recently got a CPAP machine for sleep apnea and will start using that this evening.    Patient Active Problem List   Diagnosis Date Noted  . Leg cramping 09/20/2016  . Pulmonary nodules 11/15/2015  . Urine test positive for microalbuminuria 07/09/2015  . Sleep apnea 01/27/2015  . Splenic artery aneurysm ([place]) 04/14/2014  . Arthralgia of hands, bilateral 01/21/2014  . Trigger finger of left hand 01/21/2014  . Vitamin D deficiency 01/21/2014  . HTN (hypertension) 11/11/2012  . HLD (hyperlipidemia) 11/11/2012  . Type 2 diabetes mellitus ([place]) 11/11/2012  . Restless legs syndrome 11/11/2012  . GERD (gastroesophageal reflux disease) 11/11/2012   Outpatient Encounter Prescriptions as of 01/05/2017  Medication Sig  . ACCU-CHEK AVIVA PLUS test strip TEST BLOOD SUGAR TWICE A DAY   . ACCU-CHEK SOFTCLIX LANCETS lancets TEST BLOOD SUGAR TWICE A DAY  . acetaminophen (TYLENOL)  500 MG tablet Take 500 mg by mouth every 6 (six) hours as needed. Reported on 07/09/2015  . aspirin 81 MG EC tablet Take 1 tablet (81 mg total) by mouth daily. Swallow whole.  Marland Kitchen atorvastatin (LIPITOR) 40 MG tablet TAKE 1 TABLET DAILY.  Marland Kitchen B-D 3CC LUER-LOK SYR 25GX1" 25G X 1" 3 ML MISC USE TO INJECT CYANOCOBOLAMIN / B12  . cyanocobalamin (,VITAMIN B-12,) 1000 MCG/ML injection INJECT 1ML INTRAMUSCULARLY ONCE A WEEK FOR 4 WEEKS THEN INJECT 1ML MONTHLY  . Dulaglutide (TRULICITY) 1.5 FK/8.1EX SOPN Inject 1.5 mg into the skin every 7 (seven) days.  Marland Kitchen esomeprazole (NEXIUM) 40 MG capsule TAKE 1 CAPSULE (40 MG TOTAL) BY MOUTH DAILY.  Marland Kitchen glimepiride (AMARYL) 4 MG tablet TAKE 2 TABLETS (8 MG TOTAL) BY MOUTH DAILY BEFORE BREAKFAST  . ibuprofen (ADVIL,MOTRIN) 200 MG tablet Take 200 mg by mouth every 8 (eight) hours as needed.  Marland Kitchen lisinopril (PRINIVIL,ZESTRIL) 5 MG tablet TAKE 1 TABLET EVERY DAY  . meloxicam (MOBIC) 15 MG tablet TAKE 1/2 TO 1 WHOLE TABLET DAILY AS NEEDED  . pramipexole (MIRAPEX) 0.25 MG tablet TAKE 1 TABLET AT BEDTIME  . ranitidine (ZANTAC) 150 MG tablet TAKE 1 TABLET TWICE DAILY  . traZODone (DESYREL) 50 MG tablet TAKE 1 TABLET AT BEDTIME  . Triamcinolone Acetonide (NASACORT ALLERGY 24HR NA) Place 1 spray into the nose 2 (two) times daily.  . Vitamin D, Ergocalciferol, (DRISDOL) 50000 units CAPS capsule TAKE 1 CAPSULE EVERY 7 DAYS.   No facility-administered encounter medications on  file as of 01/05/2017.       Review of Systems  Constitutional: Negative.   HENT: Negative.   Eyes: Negative.   Respiratory: Negative.   Cardiovascular: Negative.   Gastrointestinal: Negative.   Endocrine: Negative.   Genitourinary: Negative.   Musculoskeletal: Negative.   Skin: Negative.   Allergic/Immunologic: Negative.   Neurological: Negative.   Hematological: Negative.   Psychiatric/Behavioral: Negative.        Objective:   Physical Exam  Constitutional: She is oriented to person, place, and  time. She appears well-developed and well-nourished. No distress.  Patient is pleasant and alert  HENT:  Head: Normocephalic and atraumatic.  Right Ear: External ear normal.  Left Ear: External ear normal.  Nose: Nose normal.  Mouth/Throat: Oropharynx is clear and moist. No oropharyngeal exudate.  Eyes: Conjunctivae and EOM are normal. Pupils are equal, round, and reactive to light. Right eye exhibits no discharge. Left eye exhibits no discharge. No scleral icterus.  Neck: Normal range of motion. Neck supple. No thyromegaly present.  No bruits thyromegaly or anterior cervical adenopathy  Cardiovascular: Normal rate, regular rhythm, normal heart sounds and intact distal pulses.   No murmur heard. Heart is regular at 84/m  Pulmonary/Chest: Effort normal and breath sounds normal. No respiratory distress. She has no wheezes. She has no rales.  Clear anteriorly and posteriorly  Abdominal: Soft. Bowel sounds are normal. She exhibits no mass. There is no tenderness. There is no rebound and no guarding.  No liver or spleen enlargement no masses no bruits  Musculoskeletal: Normal range of motion. She exhibits no edema.  Lymphadenopathy:    She has no cervical adenopathy.  Neurological: She is alert and oriented to person, place, and time. She has normal reflexes. No cranial nerve deficit.  Skin: Skin is warm and dry. No rash noted.  Psychiatric: She has a normal mood and affect. Her behavior is normal. Judgment and thought content normal.  Nursing note and vitals reviewed.   BP 115/71 (BP Location: Left Arm)   Pulse 93   Temp 97.1 F (36.2 C) (Oral)   Ht '5\' 3"'  (1.6 m)   Wt 177 lb (80.3 kg)   BMI 31.35 kg/m         Assessment & Plan:  1. Type 2 diabetes mellitus without complication, without long-term current use of insulin (HCC) -Blood sugars have been better running between 85 and 110 fasting. She will continue to monitor these closely and continue with aggressive therapeutic  lifestyle changes. -It is important to note that she did have her eyes checked early this year at the [facility] in [place] with Differential/Platelet - Bayer DCA Hb A1c Waived - Microalbumin / creatinine urine ratio  2. Pure hypercholesterolemia -Continue with current treatment and aggressive therapeutic lifestyle changes - CBC with Differential/Platelet - Lipid panel  3. Essential hypertension -Blood pressure is good today and patient will continue with current treatment - BMP8+EGFR - CBC with Differential/Platelet - Hepatic function panel  4. Vitamin D deficiency -Continue current treatment pending results of lab work - CBC with Differential/Platelet - VITAMIN D 25 Hydroxy (Vit-D Deficiency, Fractures)  5. Gastroesophageal reflux disease, esophagitis presence not specified -Continue Nexium and consider adding ranitidine before dinner and avoid foods that irritate his stomach like fried foods spicy foods - CBC with Differential/Platelet - Hepatic function panel  6. Obesity (BMI 30.0-34.9) -Continue to work aggressively on weight through diet and exercise  7. Splenic artery aneurysm ([place]) -Repeat CT scan in one year  and forward copy of report to vascular surgeon  8. Aortic atherosclerosis ([place]) -[place] with aggressive therapeutic lifestyle changes and statin drug  9. Abnormal chest CT -Repeat chest CT without contrast low risk at April 2019  10. Sleep apnea, unspecified type -Patient will start CPAP this evening  11. Tick bite -Tests for [place] and [facility] spotted fever  Meds ordered this encounter  Medications  . Blood Glucose Monitoring Suppl (BLOOD GLUCOSE MONITOR SYSTEM) w/Device KIT    Sig: Test BS BID and PRN. Dx E11.9    Dispense:  1 each    Refill:  1    Please give machine covered by insurance and #100 lancets and #100 strips   Patient Instructions                       Medicare Annual Wellness Visit  [facility] and the medical  providers at [facility] strive to bring you the best medical care.  In doing so we not only want to address your current medical conditions and concerns but also to detect new conditions early and prevent illness, disease and health-related problems.    Medicare offers a yearly Wellness Visit which allows our clinical staff to assess your need for preventative services including immunizations, lifestyle education, counseling to decrease risk of preventable diseases and screening for fall risk and other medical concerns.    This visit is provided free of charge (no copay) for all Medicare recipients. The clinical pharmacists at [facility] have begun to conduct these Wellness Visits which will also include a thorough review of all your medications.    As you primary medical provider recommend that you make an appointment for your Annual Wellness Visit if you have not done so already this year.  You may set up this appointment before you leave today or you may call back [phone]) and schedule an appointment.  Please make sure when you call that you mention that you are scheduling your Annual Wellness Visit with the clinical pharmacist so that the appointment may be made for the proper length of time.     Continue current medications. Continue good therapeutic lifestyle changes which include good diet and exercise. Fall precautions discussed with patient. If an FOBT was given today- please return it to our front desk. If you are over 77 years old - you may need Prevnar 29 or the adult Pneumonia vaccine.  **Flu shots are available--- please call and schedule a FLU-CLINIC appointment**  After your visit with [place] today you will receive a survey in the mail or online from [employer] regarding your care with [place]. Please take a moment to fill this out. Your feedback is very important to [place] as you can help [place] better understand your patient needs as well as  improve your experience and satisfaction. WE CARE ABOUT YOU!!!   Do not forget to get your annual CT scan every spring by April 1. We will need to reevaluate the lungs the splenic artery aneurysm and the aortic atherosclerosis. Continue with aggressive therapeutic lifestyle changes weight loss and good blood sugar control We will call with lab work results as soon as these results become available Every time you get your eye exam please make sure that they send [place] a copy of the report for our records Continue to monitor blood sugars closely at home and the summer drink plenty of water and stay well hydrated    Damaris Schooner.  Laurance Flatten MD

## 2017-01-06 LAB — MICROALBUMIN / CREATININE URINE RATIO
CREATININE, UR: 76.7 mg/dL
MICROALB/CREAT RATIO: 83.8 mg/g{creat} — AB (ref 0.0–30.0)
Microalbumin, Urine: 64.3 ug/mL

## 2017-01-06 LAB — SPECIMEN STATUS REPORT

## 2017-01-08 ENCOUNTER — Telehealth: Payer: Self-pay | Admitting: Family Medicine

## 2017-01-08 NOTE — Telephone Encounter (Signed)
Pt notified to continue ASA

## 2017-01-09 LAB — CBC WITH DIFFERENTIAL/PLATELET
BASOS: 0 %
Basophils Absolute: 0 10*3/uL (ref 0.0–0.2)
EOS (ABSOLUTE): 0.1 10*3/uL (ref 0.0–0.4)
EOS: 2 %
HEMATOCRIT: 42.9 % (ref 34.0–46.6)
HEMOGLOBIN: 14.4 g/dL (ref 11.1–15.9)
IMMATURE GRANULOCYTES: 0 %
Immature Grans (Abs): 0 10*3/uL (ref 0.0–0.1)
LYMPHS ABS: 2.2 10*3/uL (ref 0.7–3.1)
Lymphs: 23 %
MCH: 29.6 pg (ref 26.6–33.0)
MCHC: 33.6 g/dL (ref 31.5–35.7)
MCV: 88 fL (ref 79–97)
MONOS ABS: 0.6 10*3/uL (ref 0.1–0.9)
Monocytes: 6 %
NEUTROS PCT: 69 %
Neutrophils Absolute: 6.5 10*3/uL (ref 1.4–7.0)
Platelets: 269 10*3/uL (ref 150–379)
RBC: 4.87 x10E6/uL (ref 3.77–5.28)
RDW: 13.8 % (ref 12.3–15.4)
WBC: 9.5 10*3/uL (ref 3.4–10.8)

## 2017-01-09 LAB — HEPATIC FUNCTION PANEL
ALT: 18 IU/L (ref 0–32)
AST: 19 IU/L (ref 0–40)
Albumin: 4.5 g/dL (ref 3.6–4.8)
Alkaline Phosphatase: 129 IU/L — ABNORMAL HIGH (ref 39–117)
BILIRUBIN TOTAL: 0.5 mg/dL (ref 0.0–1.2)
Bilirubin, Direct: 0.15 mg/dL (ref 0.00–0.40)
Total Protein: 7.4 g/dL (ref 6.0–8.5)

## 2017-01-09 LAB — LIPID PANEL
CHOL/HDL RATIO: 3.9 ratio (ref 0.0–4.4)
Cholesterol, Total: 164 mg/dL (ref 100–199)
HDL: 42 mg/dL (ref >39)
LDL Calculated: 93 mg/dL (ref 0–99)
TRIGLYCERIDES: 147 mg/dL (ref 0–149)
VLDL Cholesterol Cal: 29 mg/dL (ref 5–40)

## 2017-01-09 LAB — BMP8+EGFR
BUN / CREAT RATIO: 17 (ref 12–28)
BUN: 18 mg/dL (ref 8–27)
CO2: 23 mmol/L (ref 20–29)
CREATININE: 1.04 mg/dL — AB (ref 0.57–1.00)
Calcium: 10.6 mg/dL — ABNORMAL HIGH (ref 8.7–10.3)
Chloride: 101 mmol/L (ref 96–106)
GFR calc Af Amer: 64 mL/min/{1.73_m2} (ref >59)
GFR, EST NON AFRICAN AMERICAN: 55 mL/min/{1.73_m2} — AB (ref >59)
Glucose: 68 mg/dL (ref 65–99)
Potassium: 4.8 mmol/L (ref 3.5–5.2)
SODIUM: 142 mmol/L (ref 134–144)

## 2017-01-09 LAB — ROCKY MTN SPOTTED FVR ABS PNL(IGG+IGM)
RMSF IGM: 0.19 {index} (ref 0.00–0.89)
RMSF IgG: NEGATIVE

## 2017-01-09 LAB — VITAMIN D 25 HYDROXY (VIT D DEFICIENCY, FRACTURES): Vit D, 25-Hydroxy: 106 ng/mL — ABNORMAL HIGH (ref 30.0–100.0)

## 2017-01-09 LAB — LYME AB/WESTERN BLOT REFLEX: LYME DISEASE AB, QUANT, IGM: <0.8 index (ref 0.00–0.79)

## 2017-01-15 ENCOUNTER — Telehealth: Payer: Self-pay | Admitting: Family Medicine

## 2017-01-15 NOTE — Telephone Encounter (Signed)
Pt aware.

## 2017-02-04 DIAGNOSIS — G4733 Obstructive sleep apnea (adult) (pediatric): Secondary | ICD-10-CM | POA: Diagnosis not present

## 2017-02-07 ENCOUNTER — Telehealth: Payer: Self-pay | Admitting: Family Medicine

## 2017-02-07 NOTE — Telephone Encounter (Signed)
Patient aware that samples are ready for pick up 

## 2017-02-09 DIAGNOSIS — G4733 Obstructive sleep apnea (adult) (pediatric): Secondary | ICD-10-CM | POA: Diagnosis not present

## 2017-02-16 ENCOUNTER — Telehealth: Payer: Self-pay | Admitting: Family Medicine

## 2017-02-19 MED ORDER — DULAGLUTIDE 1.5 MG/0.5ML ~~LOC~~ SOAJ: 1.5000 mg | SUBCUTANEOUS | 1 refills | Status: DC

## 2017-02-19 NOTE — Telephone Encounter (Signed)
Patient called.  She thinks that her Medicaid has been approved.  Needs Rx for Trulicity sent to [facility] - sent today.

## 2017-02-20 ENCOUNTER — Other Ambulatory Visit: Payer: Self-pay | Admitting: Family Medicine

## 2017-03-07 DIAGNOSIS — G4733 Obstructive sleep apnea (adult) (pediatric): Secondary | ICD-10-CM | POA: Diagnosis not present

## 2017-04-07 DIAGNOSIS — G4733 Obstructive sleep apnea (adult) (pediatric): Secondary | ICD-10-CM | POA: Diagnosis not present

## 2017-05-07 DIAGNOSIS — G4733 Obstructive sleep apnea (adult) (pediatric): Secondary | ICD-10-CM | POA: Diagnosis not present

## 2017-05-10 DIAGNOSIS — G4733 Obstructive sleep apnea (adult) (pediatric): Secondary | ICD-10-CM | POA: Diagnosis not present

## 2017-05-11 ENCOUNTER — Encounter: Payer: Self-pay | Admitting: Family Medicine

## 2017-05-11 ENCOUNTER — Ambulatory Visit (INDEPENDENT_AMBULATORY_CARE_PROVIDER_SITE_OTHER): Payer: Medicare PPO | Admitting: Family Medicine

## 2017-05-11 VITALS — BP 108/61 | HR 85 | Temp 97.2°F | Ht 63.0 in | Wt 180.0 lb

## 2017-05-11 DIAGNOSIS — I10 Essential (primary) hypertension: Secondary | ICD-10-CM | POA: Diagnosis not present

## 2017-05-11 DIAGNOSIS — E119 Type 2 diabetes mellitus without complications: Secondary | ICD-10-CM

## 2017-05-11 DIAGNOSIS — R9389 Abnormal findings on diagnostic imaging of other specified body structures: Secondary | ICD-10-CM | POA: Diagnosis not present

## 2017-05-11 DIAGNOSIS — Z801 Family history of malignant neoplasm of trachea, bronchus and lung: Secondary | ICD-10-CM

## 2017-05-11 DIAGNOSIS — E7800 Pure hypercholesterolemia, unspecified: Secondary | ICD-10-CM | POA: Diagnosis not present

## 2017-05-11 DIAGNOSIS — I700 Atherosclerosis of aorta: Secondary | ICD-10-CM

## 2017-05-11 DIAGNOSIS — K219 Gastro-esophageal reflux disease without esophagitis: Secondary | ICD-10-CM

## 2017-05-11 DIAGNOSIS — G4730 Sleep apnea, unspecified: Secondary | ICD-10-CM | POA: Diagnosis not present

## 2017-05-11 DIAGNOSIS — I728 Aneurysm of other specified arteries: Secondary | ICD-10-CM | POA: Diagnosis not present

## 2017-05-11 DIAGNOSIS — Z23 Encounter for immunization: Secondary | ICD-10-CM

## 2017-05-11 DIAGNOSIS — E559 Vitamin D deficiency, unspecified: Secondary | ICD-10-CM

## 2017-05-11 LAB — BAYER DCA HB A1C WAIVED: HB A1C (BAYER DCA - WAIVED): 6.6 % (ref <7.0)

## 2017-05-11 NOTE — Progress Notes (Signed)
Subjective:    Patient ID: Theresa Wilson, female    DOB: 12-Apr-1946, 71 y.o.   MRN: [id number]  HPI Pt here for follow up and management of chronic medical problems which includes hyperlipidemia, diabetes, and hypertension. She Is taking medication regularly.  Patient is doing well overall no specific complaints.  She is requesting a refill on her B12 injections.  She will get lab work today and get her flu shot today and will be given an FOBT to return.  Her weight is up 3 pounds since it was last checked.  She denies any chest pain or shortness of breath.  She denies any trouble with swallowing heartburn indigestion nausea vomiting diarrhea or blood in the stool.  Her next colonoscopy is not due until sometime in 2020.  She is passing her water well.  She is using a CPAP machine and this is helped her feel better during the day with her energy level.  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     Patient Active Problem List   Diagnosis Date Noted  . Leg cramping 09/20/2016  . Pulmonary nodules 11/15/2015  . Urine test positive for microalbuminuria 07/09/2015  . Sleep apnea 01/27/2015  . Splenic artery aneurysm ([place]) 04/14/2014  . Arthralgia of hands, bilateral 01/21/2014  . Trigger finger of left hand 01/21/2014  . Vitamin D deficiency 01/21/2014  . HTN (hypertension) 11/11/2012  . HLD (hyperlipidemia) 11/11/2012  . Type 2 diabetes mellitus ([place]) 11/11/2012  . Restless legs syndrome 11/11/2012  . GERD (gastroesophageal reflux disease) 11/11/2012   Outpatient Encounter Prescriptions as of 05/11/2017  Medication Sig  . ACCU-CHEK AVIVA PLUS test strip TEST BLOOD SUGAR TWO TIMES DAILY AND AS NEEDED  . ACCU-CHEK SOFTCLIX LANCETS lancets TEST BLOOD SUGAR TWO TIMES DAILY  AND AS NEEDED  . acetaminophen (TYLENOL) 500 MG tablet Take 500 mg by mouth every 6 (six) hours as needed. Reported on 07/09/2015  .  aspirin 81 MG EC tablet Take 1 tablet (81 mg total) by mouth daily. Swallow whole.  Marland Kitchen atorvastatin (LIPITOR) 40 MG tablet TAKE 1 TABLET EVERY DAY  . B-D 3CC LUER-LOK SYR 25GX1" 25G X 1" 3 ML MISC USE TO INJECT CYANOCOBALAMIN (VITAMIN B-12)  . Blood Glucose Monitoring Suppl (BLOOD GLUCOSE MONITOR SYSTEM) w/Device KIT Test BS BID and PRN. Dx E11.9  . cyanocobalamin (,VITAMIN B-12,) 1000 MCG/ML injection INJECT 1ML INTRAMUSCULARLY ONCE A WEEK FOR 4 WEEKS THEN INJECT 1ML MONTHLY  . Dulaglutide (TRULICITY) 1.5 VV/6.1YW SOPN Inject 1.5 mg into the skin every 7 (seven) days.  Marland Kitchen esomeprazole (NEXIUM) 40 MG capsule TAKE 1 CAPSULE (40 MG TOTAL) BY MOUTH DAILY.  Marland Kitchen glimepiride (AMARYL) 4 MG tablet TAKE 2 TABLETS (8 MG TOTAL) BY MOUTH DAILY BEFORE BREAKFAST  . lisinopril (PRINIVIL,ZESTRIL) 5 MG tablet TAKE 1 TABLET EVERY DAY  . meloxicam (MOBIC) 15 MG tablet TAKE 1/2 TO 1 TABLET DAILY AS NEEDED  . pramipexole (MIRAPEX) 0.25 MG tablet TAKE 1 TABLET AT BEDTIME  . ranitidine (ZANTAC) 150 MG tablet TAKE 1 TABLET TWICE DAILY  . traZODone (DESYREL) 50 MG tablet TAKE 1 TABLET AT BEDTIME  . Triamcinolone Acetonide (NASACORT ALLERGY 24HR NA) Place 1 spray into the nose 2 (two) times daily.  . Vitamin D, Ergocalciferol, (DRISDOL) 50000 units CAPS capsule TAKE 1 CAPSULE EVERY 7 DAYS.  . [DISCONTINUED] ibuprofen (ADVIL,MOTRIN) 200 MG tablet Take 200 mg by mouth every 8 (eight)  hours as needed.   No facility-administered encounter medications on file as of 05/11/2017.       Review of Systems  Constitutional: Negative.   HENT: Negative.   Eyes: Negative.   Respiratory: Negative.   Cardiovascular: Negative.   Gastrointestinal: Negative.   Endocrine: Negative.   Genitourinary: Negative.   Musculoskeletal: Negative.   Skin: Negative.   Allergic/Immunologic: Negative.   Neurological: Negative.   Hematological: Negative.   Psychiatric/Behavioral: Negative.        Objective:   Physical Exam  Constitutional:  She is oriented to person, place, and time. She appears well-developed and well-nourished. No distress.  Patient is calm pleasant and relaxed  HENT:  Head: Normocephalic and atraumatic.  Right Ear: External ear normal.  Left Ear: External ear normal.  Nose: Nose normal.  Mouth/Throat: Oropharynx is clear and moist. No oropharyngeal exudate.  Eyes: Pupils are equal, round, and reactive to light. Conjunctivae and EOM are normal. Right eye exhibits no discharge. Left eye exhibits no discharge. No scleral icterus.  She is up-to-date on her eye exams  Neck: Normal range of motion. Neck supple. No thyromegaly present.  No bruits thyromegaly or anterior cervical adenopathy  Cardiovascular: Normal rate, regular rhythm, normal heart sounds and intact distal pulses.   No murmur heard. Heart has a regular rate and rhythm at 72/min  Pulmonary/Chest: Effort normal and breath sounds normal. No respiratory distress. She has no wheezes. She has no rales.  Clear anteriorly and posteriorly  Abdominal: Soft. Bowel sounds are normal. She exhibits no mass. There is no tenderness. There is no rebound and no guarding.  No liver or spleen enlargement.  No bruits.  No masses.  No inguinal adenopathy.  Musculoskeletal: Normal range of motion. She exhibits no edema or tenderness.  Lymphadenopathy:    She has no cervical adenopathy.  Neurological: She is alert and oriented to person, place, and time. She has normal reflexes. No cranial nerve deficit.  Skin: Skin is warm and dry.  Psychiatric: She has a normal mood and affect. Her behavior is normal. Judgment and thought content normal.  Nursing note and vitals reviewed.    BP 108/61 (BP Location: Left Arm)   Pulse 85   Temp (!) 97.2 F (36.2 C) (Oral)   Ht 5' 3" (1.6 m)   Wt 180 lb (81.6 kg)   BMI 31.89 kg/m       Assessment & Plan:  1. Pure hypercholesterolemia -Continue aggressive therapeutic lifestyle changes - CBC with Differential/Platelet -  Lipid panel  2. Type 2 diabetes mellitus without complication, without long-term current use of insulin ([place]) -Continue with good blood sugar control.  Monitor blood sugars regularly - CBC with Differential/Platelet - Bayer DCA Hb A1c Waived  3. Essential hypertension -Pressure is good today and she will continue with current treatment - BMP8+EGFR - CBC with Differential/Platelet - Hepatic function panel  4. Vitamin D deficiency -Continue with vitamin D replacement pending results of lab work - CBC with Differential/Platelet - VITAMIN D 25 Hydroxy (Vit-D Deficiency, Fractures)  5. Gastroesophageal reflux disease, esophagitis presence not specified -Continue with reflux measures and Nexium. - CBC with Differential/Platelet  6. Aortic atherosclerosis ([place]) -Continue with aggressive therapeutic lifestyle changes and atorvastatin  7. Abnormal chest CT -Repeat low-dose CT scan the end of March 2019 because of pulmonary nodules and splenic artery aneurysm  8. Splenic artery aneurysm [facility]) -Repeat low-dose CT scan of the chest end of March or early April  9. Sleep apnea, unspecified type -Continue  with CPAP  10. Family history of lung cancer -Get CT scan as planned in the spring 2019  No orders of the defined types were placed in this encounter.  Patient Instructions                       Medicare Annual Wellness Visit  [facility] and the medical providers at [facility] strive to bring you the best medical care.  In doing so we not only want to address your current medical conditions and concerns but also to detect new conditions early and prevent illness, disease and health-related problems.    Medicare offers a yearly Wellness Visit which allows our clinical staff to assess your need for preventative services including immunizations, lifestyle education, counseling to decrease risk of preventable diseases and screening for fall risk and other medical  concerns.    This visit is provided free of charge (no copay) for all Medicare recipients. The clinical pharmacists at [facility] have begun to conduct these Wellness Visits which will also include a thorough review of all your medications.    As you primary medical provider recommend that you make an appointment for your Annual Wellness Visit if you have not done so already this year.  You may set up this appointment before you leave today or you may call back ([phone]) and schedule an appointment.  Please make sure when you call that you mention that you are scheduling your Annual Wellness Visit with the clinical pharmacist so that the appointment may be made for the proper length of time.     Continue current medications. Continue good therapeutic lifestyle changes which include good diet and exercise. Fall precautions discussed with patient. If an FOBT was given today- please return it to our front desk. If you are over 28 years old - you may need Prevnar 57 or the adult Pneumonia vaccine.  **Flu shots are available--- please call and schedule a FLU-CLINIC appointment**  After your visit with [place] today you will receive a survey in the mail or online from [employer] regarding your care with [place]. Please take a moment to fill this out. Your feedback is very important to [place] as you can help [place] better understand your patient needs as well as improve your experience and satisfaction. WE CARE ABOUT YOU!!!   Please continue to exercise regularly and follow your diet closely Drink plenty of fluids and stay well-hydrated Check with your insurance about the new shingles shot and see if it will cover this We will call with lab work results as soon as these results become available Return the FOBT Do not forget to get your mammogram Repeat CT scan as planned    Arrie Senate MD

## 2017-05-11 NOTE — Patient Instructions (Addendum)
Medicare Annual Wellness Visit  [facility] and the medical providers at [facility] strive to bring you the best medical care.  In doing so we not only want to address your current medical conditions and concerns but also to detect new conditions early and prevent illness, disease and health-related problems.    Medicare offers a yearly Wellness Visit which allows our clinical staff to assess your need for preventative services including immunizations, lifestyle education, counseling to decrease risk of preventable diseases and screening for fall risk and other medical concerns.    This visit is provided free of charge (no copay) for all Medicare recipients. The clinical pharmacists at [facility] have begun to conduct these Wellness Visits which will also include a thorough review of all your medications.    As you primary medical provider recommend that you make an appointment for your Annual Wellness Visit if you have not done so already this year.  You may set up this appointment before you leave today or you may call back [phone]) and schedule an appointment.  Please make sure when you call that you mention that you are scheduling your Annual Wellness Visit with the clinical pharmacist so that the appointment may be made for the proper length of time.     Continue current medications. Continue good therapeutic lifestyle changes which include good diet and exercise. Fall precautions discussed with patient. If an FOBT was given today- please return it to our front desk. If you are over 28 years old - you may need Prevnar 26 or the adult Pneumonia vaccine.  **Flu shots are available--- please call and schedule a FLU-CLINIC appointment**  After your visit with [place] today you will receive a survey in the mail or online from [employer] regarding your care with [place]. Please take a moment to fill this out. Your feedback is very  important to [place] as you can help [place] better understand your patient needs as well as improve your experience and satisfaction. WE CARE ABOUT YOU!!!   Please continue to exercise regularly and follow your diet closely Drink plenty of fluids and stay well-hydrated Check with your insurance about the new shingles shot and see if it will cover this We will call with lab work results as soon as these results become available Return the FOBT Do not forget to get your mammogram Repeat CT scan as planned

## 2017-05-12 LAB — HEPATIC FUNCTION PANEL
ALBUMIN: 4.2 g/dL (ref 3.6–4.8)
ALT: 24 IU/L (ref 0–32)
AST: 19 IU/L (ref 0–40)
Alkaline Phosphatase: 128 IU/L — ABNORMAL HIGH (ref 39–117)
BILIRUBIN TOTAL: 0.4 mg/dL (ref 0.0–1.2)
Bilirubin, Direct: 0.12 mg/dL (ref 0.00–0.40)
TOTAL PROTEIN: 6.7 g/dL (ref 6.0–8.5)

## 2017-05-12 LAB — CBC WITH DIFFERENTIAL/PLATELET
Basophils Absolute: 0 10*3/uL (ref 0.0–0.2)
Basos: 1 %
EOS (ABSOLUTE): 0.2 10*3/uL (ref 0.0–0.4)
Eos: 2 %
HEMATOCRIT: 39.6 % (ref 34.0–46.6)
Hemoglobin: 13.6 g/dL (ref 11.1–15.9)
Immature Grans (Abs): 0 10*3/uL (ref 0.0–0.1)
Immature Granulocytes: 0 %
LYMPHS ABS: 2.4 10*3/uL (ref 0.7–3.1)
LYMPHS: 32 %
MCH: 30.2 pg (ref 26.6–33.0)
MCHC: 34.3 g/dL (ref 31.5–35.7)
MCV: 88 fL (ref 79–97)
MONOS ABS: 0.7 10*3/uL (ref 0.1–0.9)
Monocytes: 9 %
NEUTROS ABS: 4.2 10*3/uL (ref 1.4–7.0)
NEUTROS PCT: 56 %
Platelets: 261 10*3/uL (ref 150–379)
RBC: 4.5 x10E6/uL (ref 3.77–5.28)
RDW: 13.7 % (ref 12.3–15.4)
WBC: 7.5 10*3/uL (ref 3.4–10.8)

## 2017-05-12 LAB — LIPID PANEL
CHOL/HDL RATIO: 3.7 ratio (ref 0.0–4.4)
Cholesterol, Total: 156 mg/dL (ref 100–199)
HDL: 42 mg/dL (ref >39)
LDL Calculated: 93 mg/dL (ref 0–99)
TRIGLYCERIDES: 107 mg/dL (ref 0–149)
VLDL Cholesterol Cal: 21 mg/dL (ref 5–40)

## 2017-05-12 LAB — BMP8+EGFR
BUN / CREAT RATIO: 17 (ref 12–28)
BUN: 15 mg/dL (ref 8–27)
CO2: 24 mmol/L (ref 20–29)
CREATININE: 0.87 mg/dL (ref 0.57–1.00)
Calcium: 9.7 mg/dL (ref 8.7–10.3)
Chloride: 104 mmol/L (ref 96–106)
GFR calc non Af Amer: 69 mL/min/{1.73_m2} (ref >59)
GFR, EST AFRICAN AMERICAN: 79 mL/min/{1.73_m2} (ref >59)
Glucose: 62 mg/dL — ABNORMAL LOW (ref 65–99)
Potassium: 3.8 mmol/L (ref 3.5–5.2)
SODIUM: 144 mmol/L (ref 134–144)

## 2017-05-12 LAB — VITAMIN D 25 HYDROXY (VIT D DEFICIENCY, FRACTURES): VIT D 25 HYDROXY: 66.3 ng/mL (ref 30.0–100.0)

## 2017-06-07 DIAGNOSIS — G4733 Obstructive sleep apnea (adult) (pediatric): Secondary | ICD-10-CM | POA: Diagnosis not present

## 2017-06-27 ENCOUNTER — Other Ambulatory Visit: Payer: Self-pay | Admitting: Family Medicine

## 2017-07-07 DIAGNOSIS — G4733 Obstructive sleep apnea (adult) (pediatric): Secondary | ICD-10-CM | POA: Diagnosis not present

## 2017-07-11 ENCOUNTER — Telehealth: Payer: Self-pay | Admitting: Family Medicine

## 2017-07-11 ENCOUNTER — Other Ambulatory Visit: Payer: Self-pay | Admitting: Family Medicine

## 2017-07-11 ENCOUNTER — Other Ambulatory Visit: Payer: Self-pay

## 2017-07-11 MED ORDER — CYANOCOBALAMIN 1000 MCG/ML IJ SOLN: INTRAMUSCULAR | 3 refills | Status: DC

## 2017-07-11 NOTE — Telephone Encounter (Signed)
Refill sent to mail order. 

## 2017-07-25 ENCOUNTER — Other Ambulatory Visit: Payer: Self-pay | Admitting: Family Medicine

## 2017-08-29 ENCOUNTER — Other Ambulatory Visit: Payer: Self-pay | Admitting: Family Medicine

## 2017-08-29 NOTE — Telephone Encounter (Signed)
It is okay to refill Trulicity and trazodone.

## 2017-08-31 ENCOUNTER — Other Ambulatory Visit: Payer: Self-pay | Admitting: Family Medicine

## 2017-09-07 DIAGNOSIS — G4733 Obstructive sleep apnea (adult) (pediatric): Secondary | ICD-10-CM | POA: Diagnosis not present

## 2017-09-18 ENCOUNTER — Other Ambulatory Visit: Payer: Self-pay | Admitting: Family Medicine

## 2017-09-19 ENCOUNTER — Ambulatory Visit: Payer: Medicare PPO | Admitting: Family Medicine

## 2017-10-05 DIAGNOSIS — G4733 Obstructive sleep apnea (adult) (pediatric): Secondary | ICD-10-CM | POA: Diagnosis not present

## 2017-11-05 DIAGNOSIS — G4733 Obstructive sleep apnea (adult) (pediatric): Secondary | ICD-10-CM | POA: Diagnosis not present

## 2017-11-21 ENCOUNTER — Ambulatory Visit: Payer: Medicare PPO | Admitting: Family Medicine

## 2017-11-26 ENCOUNTER — Other Ambulatory Visit: Payer: Self-pay | Admitting: Family Medicine

## 2017-11-26 NOTE — Telephone Encounter (Signed)
OV 12/06/17 

## 2017-12-05 DIAGNOSIS — G4733 Obstructive sleep apnea (adult) (pediatric): Secondary | ICD-10-CM | POA: Diagnosis not present

## 2017-12-06 ENCOUNTER — Encounter: Payer: Self-pay | Admitting: Family Medicine

## 2017-12-06 ENCOUNTER — Ambulatory Visit (INDEPENDENT_AMBULATORY_CARE_PROVIDER_SITE_OTHER): Payer: Medicare PPO | Admitting: Family Medicine

## 2017-12-06 VITALS — BP 111/65 | HR 98 | Temp 97.4°F | Ht 63.0 in | Wt 181.0 lb

## 2017-12-06 DIAGNOSIS — E559 Vitamin D deficiency, unspecified: Secondary | ICD-10-CM | POA: Diagnosis not present

## 2017-12-06 DIAGNOSIS — J029 Acute pharyngitis, unspecified: Secondary | ICD-10-CM

## 2017-12-06 DIAGNOSIS — Z801 Family history of malignant neoplasm of trachea, bronchus and lung: Secondary | ICD-10-CM

## 2017-12-06 DIAGNOSIS — E7800 Pure hypercholesterolemia, unspecified: Secondary | ICD-10-CM | POA: Diagnosis not present

## 2017-12-06 DIAGNOSIS — I700 Atherosclerosis of aorta: Secondary | ICD-10-CM

## 2017-12-06 DIAGNOSIS — R809 Proteinuria, unspecified: Secondary | ICD-10-CM

## 2017-12-06 DIAGNOSIS — E669 Obesity, unspecified: Secondary | ICD-10-CM | POA: Diagnosis not present

## 2017-12-06 DIAGNOSIS — I728 Aneurysm of other specified arteries: Secondary | ICD-10-CM | POA: Diagnosis not present

## 2017-12-06 DIAGNOSIS — E1129 Type 2 diabetes mellitus with other diabetic kidney complication: Secondary | ICD-10-CM | POA: Diagnosis not present

## 2017-12-06 DIAGNOSIS — R911 Solitary pulmonary nodule: Secondary | ICD-10-CM

## 2017-12-06 DIAGNOSIS — Z1211 Encounter for screening for malignant neoplasm of colon: Secondary | ICD-10-CM | POA: Diagnosis not present

## 2017-12-06 DIAGNOSIS — K219 Gastro-esophageal reflux disease without esophagitis: Secondary | ICD-10-CM

## 2017-12-06 DIAGNOSIS — E119 Type 2 diabetes mellitus without complications: Secondary | ICD-10-CM

## 2017-12-06 DIAGNOSIS — I10 Essential (primary) hypertension: Secondary | ICD-10-CM | POA: Diagnosis not present

## 2017-12-06 DIAGNOSIS — R6889 Other general symptoms and signs: Secondary | ICD-10-CM | POA: Diagnosis not present

## 2017-12-06 DIAGNOSIS — R9389 Abnormal findings on diagnostic imaging of other specified body structures: Secondary | ICD-10-CM

## 2017-12-06 DIAGNOSIS — Z1231 Encounter for screening mammogram for malignant neoplasm of breast: Secondary | ICD-10-CM

## 2017-12-06 DIAGNOSIS — Z1239 Encounter for other screening for malignant neoplasm of breast: Secondary | ICD-10-CM

## 2017-12-06 LAB — RAPID STREP SCREEN (MED CTR MEBANE ONLY): Strep Gp A Ag, IA W/Reflex: NEGATIVE

## 2017-12-06 LAB — BAYER DCA HB A1C WAIVED: HB A1C (BAYER DCA - WAIVED): 7.2 % — ABNORMAL HIGH (ref <7.0)

## 2017-12-06 LAB — CULTURE, GROUP A STREP

## 2017-12-06 NOTE — Patient Instructions (Addendum)
Medicare Annual Wellness Visit  [facility] and the medical providers at [facility] strive to bring you the best medical care.  In doing so we not only want to address your current medical conditions and concerns but also to detect new conditions early and prevent illness, disease and health-related problems.    Medicare offers a yearly Wellness Visit which allows our clinical staff to assess your need for preventative services including immunizations, lifestyle education, counseling to decrease risk of preventable diseases and screening for fall risk and other medical concerns.    This visit is provided free of charge (no copay) for all Medicare recipients. The clinical pharmacists at [facility] have begun to conduct these Wellness Visits which will also include a thorough review of all your medications.    As you primary medical provider recommend that you make an appointment for your Annual Wellness Visit if you have not done so already this year.  You may set up this appointment before you leave today or you may call back [phone]) and schedule an appointment.  Please make sure when you call that you mention that you are scheduling your Annual Wellness Visit with the clinical pharmacist so that the appointment may be made for the proper length of time.     Continue current medications. Continue good therapeutic lifestyle changes which include good diet and exercise. Fall precautions discussed with patient. If an FOBT was given today- please return it to our front desk. If you are over 22 years old - you may need Prevnar 103 or the adult Pneumonia vaccine.  **Flu shots are available--- please call and schedule a FLU-CLINIC appointment**  After your visit with [place] today you will receive a survey in the mail or online from [employer] regarding your care with [place]. Please take a moment to fill this out. Your feedback is very  important to [place] as you can help [place] better understand your patient needs as well as improve your experience and satisfaction. WE CARE ABOUT YOU!!!   We will need to schedule a low-dose chest CT to follow-up right-sided thyroid nodule and right-sided pulmonary nodules. The patient must get her eye exam and make sure that we get a copy of this report We will call with results of the throat culture as soon as it becomes available She will need to get a colonoscopy in early 2020 that is routine for 10-year follow-up She will need to get the repeat CT scan of the chest as noted above She should continue with the Nexium and if she so desires should try the wean off of this and go with ranitidine 150 mg twice daily before breakfast and supper. Continue following diet closely and check blood sugars regularly Do not forget to get mammogram done.

## 2017-12-06 NOTE — Progress Notes (Signed)
Subjective:    Patient ID: Theresa Wilson, female    DOB: [birth year], 72 y.o.   MRN: [id number]  HPI Pt here for follow up and management of chronic medical problems which includes diabetes, hypertension and hyperlipidemia. He is taking medication regularly.  Patient today's only complaint is sore throat and hoarseness.  She does not need any refills.  She did bring in an FOBT to be registered.  She will schedule her mammogram in [place].  She had a CT scan in March 2018.  This will be reviewed with her during the visit today.  It did show coronary artery atherosclerosis and aortic atherosclerosis.  The splenic artery aneurysm was 1 cm.  It was recommended that she should have annual screening with low-dose chest CT without contrast in 12 months.  This means that we are a little past due for the CT scan.  We will make sure that she gets this scheduled for she leaves the office today.  Patient had a negative pole it on September 21, 2016.  The patient is doing great overall.  Her weight is stable and her body mass index is 31.89.  She denies any chest pain pressure tightness palpitations or shortness of breath anymore than usual.  She denies any trouble with swallowing other than some heartburn.  She denies any blood in the stool black tarry bowel movements or change in bowel habits and did bring in an FOBT to be checked today.  She is passing her water without problems.  She does complain of a sore throat and increased allergy problems she thinks that this is more viral than bacterial but we will get a rapid strep test.     Patient Active Problem List   Diagnosis Date Noted  . Family history of lung cancer 05/11/2017  . Leg cramping 09/20/2016  . Pulmonary nodules 11/15/2015  . Urine test positive for microalbuminuria 07/09/2015  . Sleep apnea 01/27/2015  . Splenic artery aneurysm ([place]) 04/14/2014  . Arthralgia of hands, bilateral 01/21/2014  . Trigger finger of left hand 01/21/2014  . Vitamin  D deficiency 01/21/2014  . HTN (hypertension) 11/11/2012  . HLD (hyperlipidemia) 11/11/2012  . Type 2 diabetes mellitus ([place]) 11/11/2012  . Restless legs syndrome 11/11/2012  . GERD (gastroesophageal reflux disease) 11/11/2012   Outpatient Encounter Medications as of 12/06/2017  Medication Sig  . ACCU-CHEK AVIVA PLUS test strip TEST BLOOD SUGAR TWO TIMES DAILY AND AS NEEDED  . ACCU-CHEK SOFTCLIX LANCETS lancets TEST BLOOD SUGAR TWO TIMES DAILY  AND AS NEEDED  . acetaminophen (TYLENOL) 500 MG tablet Take 500 mg by mouth every 6 (six) hours as needed. Reported on 07/09/2015  . aspirin 81 MG EC tablet Take 1 tablet (81 mg total) by mouth daily. Swallow whole.  Marland Kitchen atorvastatin (LIPITOR) 40 MG tablet TAKE 1 TABLET EVERY DAY  . B-D 3CC LUER-LOK SYR 25GX1" 25G X 1" 3 ML MISC USE TO INJECT CYANOCOBALAMIN (VITAMIN B-12)  . Blood Glucose Monitoring Suppl (BLOOD GLUCOSE MONITOR SYSTEM) w/Device KIT Test BS BID and PRN. Dx E11.9  . cyanocobalamin (,VITAMIN B-12,) 1000 MCG/ML injection INJECT 1ML IM MONTHLY  . esomeprazole (NEXIUM) 40 MG capsule TAKE 1 CAPSULE (40 MG TOTAL) BY MOUTH DAILY.  Marland Kitchen glimepiride (AMARYL) 4 MG tablet TAKE 2 TABLETS (8 MG TOTAL) DAILY BEFORE BREAKFAST  . lisinopril (PRINIVIL,ZESTRIL) 5 MG tablet TAKE 1 TABLET EVERY DAY  . meloxicam (MOBIC) 15 MG tablet TAKE 1/2 TO 1 TABLET DAILY AS NEEDED  . pramipexole (MIRAPEX)  0.25 MG tablet TAKE 1 TABLET AT BEDTIME  . ranitidine (ZANTAC) 150 MG tablet TAKE 1 TABLET TWICE DAILY  . traZODone (DESYREL) 50 MG tablet TAKE 1 TABLET AT BEDTIME  . Triamcinolone Acetonide (NASACORT ALLERGY 24HR NA) Place 1 spray into the nose 2 (two) times daily.  . Vitamin D, Ergocalciferol, (DRISDOL) 50000 units CAPS capsule TAKE 1 CAPSULE EVERY 7 DAYS.   No facility-administered encounter medications on file as of 12/06/2017.      Review of Systems  Constitutional: Negative.   HENT: Positive for sore throat and voice change (hoarseness ).   Eyes: Negative.     Respiratory: Negative.   Cardiovascular: Negative.   Gastrointestinal: Negative.   Endocrine: Negative.   Genitourinary: Negative.   Musculoskeletal: Negative.   Skin: Negative.   Allergic/Immunologic: Negative.   Neurological: Negative.   Hematological: Negative.   Psychiatric/Behavioral: Negative.        Objective:   Physical Exam  Constitutional: She is oriented to person, place, and time. She appears well-developed and well-nourished. No distress.  The patient is pleasant and alert  HENT:  Head: Normocephalic and atraumatic.  Right Ear: External ear normal.  Left Ear: External ear normal.  She has nasal turbinate congestion bilaterally and throat appears red posteriorly but no adenopathy.  Rapid strep test was done and is pending.  Eyes: Pupils are equal, round, and reactive to light. Conjunctivae and EOM are normal. Right eye exhibits no discharge. Left eye exhibits no discharge.  Patient is aware that she needs to schedule an eye exam.  She will make sure that we get a copy of this report.  Neck: Normal range of motion. Neck supple. No thyromegaly present.  No bruits thyromegaly or anterior cervical adenopathy  Cardiovascular: Normal rate, regular rhythm, normal heart sounds and intact distal pulses.  No murmur heard. Heart is regular at 84/min  Pulmonary/Chest: Effort normal and breath sounds normal. She has no wheezes. She has no rales.  Clear anteriorly and posteriorly without any increased congestion with coughing  Abdominal: Soft. Bowel sounds are normal. She exhibits no mass. There is no tenderness.  No liver or spleen enlargement no epigastric tenderness no masses or bruits and no inguinal adenopathy  Musculoskeletal: Normal range of motion. She exhibits no edema or deformity.  Lymphadenopathy:    She has no cervical adenopathy.  Neurological: She is alert and oriented to person, place, and time. She has normal reflexes. No cranial nerve deficit.  Skin: Skin is  warm and dry. No rash noted.  Psychiatric: She has a normal mood and affect. Her behavior is normal. Judgment and thought content normal.  The patient is alert with normal mood behavior thought and judgment  Nursing note and vitals reviewed.    BP 111/65 (BP Location: Left Arm)   Pulse 98   Temp (!) 97.4 F (36.3 C) (Oral)   Ht '5\' 3"'  (1.6 m)   Wt 181 lb (82.1 kg)   BMI 32.06 kg/m         Assessment & Plan:  1. Type 2 diabetes mellitus without complication, without long-term current use of insulin (HCC) -Blood sugars at home of been running between 100 and 138 fasting.  The majority of the readings are around 100.  She will continue to monitor blood sugars at home.  She will continue with current treatment pending results of lab work - CBC with Differential/Platelet - BMP8+EGFR - Bayer DCA Hb A1c Waived  2. Pure hypercholesterolemia -Continue with atorvastatin pending results of  lab work as well as with therapeutic lifestyle changes - CBC with Differential/Platelet - Lipid panel - Hepatic function panel  3. Essential hypertension -Continue with current blood pressure medicines as blood pressure is good today. - CBC with Differential/Platelet - BMP8+EGFR - Hepatic function panel  4. Vitamin D deficiency -Continue with vitamin D replacement - CBC with Differential/Platelet - VITAMIN D 25 Hydroxy (Vit-D Deficiency, Fractures)  5. Gastroesophageal reflux disease, esophagitis presence not specified -Resume Nexium and somewhere in the future try to wean off of this and get back on ranitidine if possible - CBC with Differential/Platelet  6. Aortic atherosclerosis ([place]) -Continue with aggressive therapeutic lifestyle changes and statin therapy - CBC with Differential/Platelet  7. Abnormal chest CT - CT CHEST LUNG CA SCREEN LOW DOSE W/O CM; Future  8. Splenic artery aneurysm (HCC) - CT CHEST LUNG CA SCREEN LOW DOSE W/O CM; Future  9. Family history of lung cancer - CT  CHEST LUNG CA SCREEN LOW DOSE W/O CM; Future  10. Type 2 diabetes mellitus with microalbuminuria, without long-term current use of insulin (HCC) -Continue with current treatment pending results of lab work  11. Obesity (BMI 30.0-34.9) -Continue to work aggressively on reducing weight through diet and exercise  12. Screen for colon cancer -Patient will need colonoscopy in early 2020 - Fecal occult blood, imunochemical  13. Screening for breast cancer - MM Digital Screening; Future  14. Pulmonary nodule - CT CHEST LUNG CA SCREEN LOW DOSE W/O CM; Future  15. Sore throat - Rapid Strep Screen ([facility] ONLY)  Patient Instructions                       Medicare Annual Wellness Visit  [facility] and the medical providers at [facility] strive to bring you the best medical care.  In doing so we not only want to address your current medical conditions and concerns but also to detect new conditions early and prevent illness, disease and health-related problems.    Medicare offers a yearly Wellness Visit which allows our clinical staff to assess your need for preventative services including immunizations, lifestyle education, counseling to decrease risk of preventable diseases and screening for fall risk and other medical concerns.    This visit is provided free of charge (no copay) for all Medicare recipients. The clinical pharmacists at [facility] have begun to conduct these Wellness Visits which will also include a thorough review of all your medications.    As you primary medical provider recommend that you make an appointment for your Annual Wellness Visit if you have not done so already this year.  You may set up this appointment before you leave today or you may call back ([phone]) and schedule an appointment.  Please make sure when you call that you mention that you are scheduling your Annual Wellness Visit with the clinical pharmacist so  that the appointment may be made for the proper length of time.     Continue current medications. Continue good therapeutic lifestyle changes which include good diet and exercise. Fall precautions discussed with patient. If an FOBT was given today- please return it to our front desk. If you are over 48 years old - you may need Prevnar 93 or the adult Pneumonia vaccine.  **Flu shots are available--- please call and schedule a FLU-CLINIC appointment**  After your visit with [place] today you will receive a survey in the mail or online from [employer]  regarding your care with [place]. Please take a moment to fill this out. Your feedback is very important to [place] as you can help [place] better understand your patient needs as well as improve your experience and satisfaction. WE CARE ABOUT YOU!!!   We will need to schedule a low-dose chest CT to follow-up right-sided thyroid nodule and right-sided pulmonary nodules. The patient must get her eye exam and make sure that we get a copy of this report We will call with results of the throat culture as soon as it becomes available She will need to get a colonoscopy in early 2020 that is routine for 10-year follow-up She will need to get the repeat CT scan of the chest as noted above She should continue with the Nexium and if she so desires should try the wean off of this and go with ranitidine 150 mg twice daily before breakfast and supper. Continue following diet closely and check blood sugars regularly Do not forget to get mammogram done.  Arrie Senate MD

## 2017-12-07 LAB — CBC WITH DIFFERENTIAL/PLATELET
BASOS ABS: 0 10*3/uL (ref 0.0–0.2)
Basos: 0 %
EOS (ABSOLUTE): 0.1 10*3/uL (ref 0.0–0.4)
EOS: 2 %
Hematocrit: 41.1 % (ref 34.0–46.6)
Hemoglobin: 14.1 g/dL (ref 11.1–15.9)
IMMATURE GRANULOCYTES: 0 %
Immature Grans (Abs): 0 10*3/uL (ref 0.0–0.1)
LYMPHS: 19 %
Lymphocytes Absolute: 1.6 10*3/uL (ref 0.7–3.1)
MCH: 29.7 pg (ref 26.6–33.0)
MCHC: 34.3 g/dL (ref 31.5–35.7)
MCV: 87 fL (ref 79–97)
MONOCYTES: 7 %
MONOS ABS: 0.6 10*3/uL (ref 0.1–0.9)
NEUTROS PCT: 72 %
Neutrophils Absolute: 6.2 10*3/uL (ref 1.4–7.0)
PLATELETS: 235 10*3/uL (ref 150–379)
RBC: 4.74 x10E6/uL (ref 3.77–5.28)
RDW: 14.5 % (ref 12.3–15.4)
WBC: 8.5 10*3/uL (ref 3.4–10.8)

## 2017-12-07 LAB — BMP8+EGFR
BUN/Creatinine Ratio: 20 (ref 12–28)
BUN: 18 mg/dL (ref 8–27)
CHLORIDE: 100 mmol/L (ref 96–106)
CO2: 24 mmol/L (ref 20–29)
Calcium: 10.4 mg/dL — ABNORMAL HIGH (ref 8.7–10.3)
Creatinine, Ser: 0.88 mg/dL (ref 0.57–1.00)
GFR calc Af Amer: 78 mL/min/{1.73_m2} (ref >59)
GFR calc non Af Amer: 67 mL/min/{1.73_m2} (ref >59)
GLUCOSE: 77 mg/dL (ref 65–99)
POTASSIUM: 4.6 mmol/L (ref 3.5–5.2)
SODIUM: 139 mmol/L (ref 134–144)

## 2017-12-07 LAB — HEPATIC FUNCTION PANEL
ALK PHOS: 127 IU/L — AB (ref 39–117)
ALT: 31 IU/L (ref 0–32)
AST: 24 IU/L (ref 0–40)
Albumin: 4.5 g/dL (ref 3.6–4.8)
Bilirubin Total: 0.5 mg/dL (ref 0.0–1.2)
Bilirubin, Direct: 0.15 mg/dL (ref 0.00–0.40)
TOTAL PROTEIN: 7.1 g/dL (ref 6.0–8.5)

## 2017-12-07 LAB — LIPID PANEL
CHOL/HDL RATIO: 3.6 ratio (ref 0.0–4.4)
CHOLESTEROL TOTAL: 164 mg/dL (ref 100–199)
HDL: 45 mg/dL (ref >39)
LDL Calculated: 84 mg/dL (ref 0–99)
TRIGLYCERIDES: 176 mg/dL — AB (ref 0–149)
VLDL Cholesterol Cal: 35 mg/dL (ref 5–40)

## 2017-12-07 LAB — VITAMIN D 25 HYDROXY (VIT D DEFICIENCY, FRACTURES): Vit D, 25-Hydroxy: 80.7 ng/mL (ref 30.0–100.0)

## 2017-12-08 LAB — FECAL OCCULT BLOOD, IMMUNOCHEMICAL: FECAL OCCULT BLD: NEGATIVE

## 2017-12-09 LAB — VITAMIN B12: VITAMIN B 12: 391 pg/mL (ref 232–1245)

## 2017-12-09 LAB — SPECIMEN STATUS REPORT

## 2017-12-11 ENCOUNTER — Telehealth: Payer: Self-pay | Admitting: Family Medicine

## 2017-12-27 ENCOUNTER — Telehealth: Payer: Self-pay | Admitting: Family Medicine

## 2017-12-27 DIAGNOSIS — Z1231 Encounter for screening mammogram for malignant neoplasm of breast: Secondary | ICD-10-CM | POA: Diagnosis not present

## 2017-12-27 NOTE — Telephone Encounter (Signed)
Aware that Roselyn Reef is off today

## 2017-12-28 NOTE — Telephone Encounter (Signed)
There is a order in for this - can we use that order and schedule at [facility] ??

## 2018-01-05 DIAGNOSIS — G4733 Obstructive sleep apnea (adult) (pediatric): Secondary | ICD-10-CM | POA: Diagnosis not present

## 2018-01-10 DIAGNOSIS — G4733 Obstructive sleep apnea (adult) (pediatric): Secondary | ICD-10-CM | POA: Diagnosis not present

## 2018-02-06 ENCOUNTER — Ambulatory Visit (INDEPENDENT_AMBULATORY_CARE_PROVIDER_SITE_OTHER): Payer: Medicare PPO | Admitting: *Deleted

## 2018-02-06 ENCOUNTER — Encounter: Payer: Self-pay | Admitting: *Deleted

## 2018-02-06 VITALS — BP 119/68 | HR 75 | Wt 181.0 lb

## 2018-02-06 DIAGNOSIS — R9389 Abnormal findings on diagnostic imaging of other specified body structures: Secondary | ICD-10-CM

## 2018-02-06 DIAGNOSIS — Z801 Family history of malignant neoplasm of trachea, bronchus and lung: Secondary | ICD-10-CM

## 2018-02-06 DIAGNOSIS — Z0000 Encounter for general adult medical examination without abnormal findings: Secondary | ICD-10-CM

## 2018-02-06 DIAGNOSIS — I700 Atherosclerosis of aorta: Secondary | ICD-10-CM

## 2018-02-06 DIAGNOSIS — I728 Aneurysm of other specified arteries: Secondary | ICD-10-CM

## 2018-02-06 MED ORDER — RANITIDINE HCL 150 MG PO TABS: 150.0000 mg | ORAL_TABLET | Freq: Two times a day (BID) | ORAL | 1 refills | Status: DC

## 2018-02-06 MED ORDER — ESOMEPRAZOLE MAGNESIUM 40 MG PO CPDR: 40.0000 mg | DELAYED_RELEASE_CAPSULE | Freq: Every day | ORAL | 1 refills | Status: DC

## 2018-02-06 NOTE — Patient Instructions (Signed)
Please review the information given on Advance Directives, and if you complete this please bring our office a copy to be filed in your medical record.  Please continue to work on your goal of reducing your Hemoglobin A1c to less than 7% by watching your carbohydrate intake.   Thank you for coming in for your Annual Wellness Visit today!   Preventive Care 22 Years and Older, Female Preventive care refers to lifestyle choices and visits with your health care provider that can promote health and wellness. What does preventive care include?  A yearly physical exam. This is also called an annual well check.  Dental exams once or twice a year.  Routine eye exams. Ask your health care provider how often you should have your eyes checked.  Personal lifestyle choices, including: ? Daily care of your teeth and gums. ? Regular physical activity. ? Eating a healthy diet. ? Avoiding tobacco and drug use. ? Limiting alcohol use. ? Practicing safe sex. ? Taking low-dose aspirin every day. ? Taking vitamin and mineral supplements as recommended by your health care provider. What happens during an annual well check? The services and screenings done by your health care provider during your annual well check will depend on your age, overall health, lifestyle risk factors, and family history of disease. Counseling Your health care provider may ask you questions about your:  Alcohol use.  Tobacco use.  Drug use.  Emotional well-being.  Home and relationship well-being.  Sexual activity.  Eating habits.  History of falls.  Memory and ability to understand (cognition).  Work and work Statistician.  Reproductive health.  Screening You may have the following tests or measurements:  Height, weight, and BMI.  Blood pressure.  Lipid and cholesterol levels. These may be checked every 5 years, or more frequently if you are over 1 years old.  Skin check.  Lung cancer screening. You  may have this screening every year starting at age 92 if you have a 30-pack-year history of smoking and currently smoke or have quit within the past 15 years.  Fecal occult blood test (FOBT) of the stool. You may have this test every year starting at age 23.  Flexible sigmoidoscopy or colonoscopy. You may have a sigmoidoscopy every 5 years or a colonoscopy every 10 years starting at age 51.  Hepatitis C blood test.  Hepatitis B blood test.  Sexually transmitted disease (STD) testing.  Diabetes screening. This is done by checking your blood sugar (glucose) after you have not eaten for a while (fasting). You may have this done every 1-3 years.  Bone density scan. This is done to screen for osteoporosis. You may have this done starting at age 55.  Mammogram. This may be done every 1-2 years. Talk to your health care provider about how often you should have regular mammograms.  Talk with your health care provider about your test results, treatment options, and if necessary, the need for more tests. Vaccines Your health care provider may recommend certain vaccines, such as:  Influenza vaccine. This is recommended every year.  Tetanus, diphtheria, and acellular pertussis (Tdap, Td) vaccine. You may need a Td booster every 10 years.  Varicella vaccine. You may need this if you have not been vaccinated.  Zoster vaccine. You may need this after age 8.  Measles, mumps, and rubella (MMR) vaccine. You may need at least one dose of MMR if you were born in 1957 or later. You may also need a second dose.  Pneumococcal  13-valent conjugate (PCV13) vaccine. One dose is recommended after age 32.  Pneumococcal polysaccharide (PPSV23) vaccine. One dose is recommended after age 31.  Meningococcal vaccine. You may need this if you have certain conditions.  Hepatitis A vaccine. You may need this if you have certain conditions or if you travel or work in places where you may be exposed to hepatitis  A.  Hepatitis B vaccine. You may need this if you have certain conditions or if you travel or work in places where you may be exposed to hepatitis B.  Haemophilus influenzae type b (Hib) vaccine. You may need this if you have certain conditions.  Talk to your health care provider about which screenings and vaccines you need and how often you need them. This information is not intended to replace advice given to you by your health care provider. Make sure you discuss any questions you have with your health care provider. Document Released: 08/06/2015 Document Revised: 03/29/2016 Document Reviewed: 05/11/2015 Elsevier Interactive Patient Education  [employer].

## 2018-02-06 NOTE — Progress Notes (Signed)
The previous chest CT report recommended annual screening based on the CT scan that was done previously over one year ago.  She also has a splenic artery aneurysm and her sister died of lung cancer.  Please try to override insurance and get repeat screening chest CT as recommended by radiology

## 2018-02-06 NOTE — Progress Notes (Signed)
Subjective:   Theresa Wilson is a 72 y.o. female who presents for Medicare Annual (Subsequent) preventive examination.  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  She babysits her 70 year old and 83 month old grandsons daily.  She enjoys sewing, knitting, reading, and watching television.  She lives alone in an apartment in [place], [place].  She has 2 daughters, and 4 grandsons.  Theresa Wilson feels that her health is better this year than it was last year because her Hemoglobin A1c has improved.  She reports no hospitalizations, ER visits, or surgeries in the past year.   Review of Systems:  All negative today       Objective:     Vitals: BP 119/68   Pulse 75   Wt 181 lb (82.1 kg)   BMI 32.06 kg/m   Body mass index is 32.06 kg/m.  Advanced Directives 02/06/2018 09/20/2016 08/20/2015 07/31/2014 04/14/2014  Does Patient Have a Medical Advance Directive? _0   Would patient like information on creating a medical advance directive? Yes (MAU/Ambulatory/Procedural Areas - Information given) No - Patient declined No - patient declined information Yes - Educational materials given No - patient declined information    Tobacco Social History   Tobacco Use  Smoking Status Former Smoker  . Packs/day: 0.50  . Years: 45.00  . Pack years: 22.50  . Types: Cigarettes  . Last attempt to quit: 11/11/2008  . Years since quitting: 9.2  Smokeless Tobacco Never Used       Clinical Intake:     Pain Score: 0-No pain                 Past Medical History:  Diagnosis Date  . Allergy   . Aneurysm of splenic artery (HCC)   . Cataract   . Diabetes mellitus without complication ([facility])   . Hyperlipidemia   . Hypertension   . Restless leg syndrome    Past Surgical History:  Procedure Laterality Date  . OVARIAN CYST REMOVAL     Family History  Problem Relation Age of Onset  . Heart attack Mother 48       Died age 17  . Hypertension Mother     . Hypertension Father   . Heart disease Father   . Congestive Heart Failure Father        Lived to age 48  . Cancer Sister        lung  . Hyperlipidemia Sister   . Hypertension Sister    Social History   Socioeconomic History  . Marital status: Divorced    Spouse name: Not on file  . Number of children: 2  . Years of education: Not on file  . Highest education level: Not on file  Occupational History  . Occupation: Secretary/administrator  Social Needs  . Financial resource strain: Not on file  . Food insecurity:    Worry: Not on file    Inability: Not on file  . Transportation needs:    Medical: Not on file    Non-medical: Not on file  Tobacco Use  . Smoking status: Former Smoker    Packs/day: 0.50    Years: 45.00    Pack years: 22.50    Types: Cigarettes    Last attempt to quit: 11/11/2008    Years since quitting: 9.2  . Smokeless tobacco: Never Used  Substance and Sexual Activity  . Alcohol use: No    Comment: occassional  .  Drug use: No  . Sexual activity: Not Currently  Lifestyle  . Physical activity:    Days per week: Not on file    Minutes per session: Not on file  . Stress: Not on file  Relationships  . Social connections:    Talks on phone: Not on file    Gets together: Not on file    Attends religious service: Not on file    Active member of club or organization: Not on file    Attends meetings of clubs or organizations: Not on file    Relationship status: Not on file  Other Topics Concern  . 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     Outpatient Encounter Medications as of 02/06/2018  Medication Sig  . ACCU-CHEK AVIVA PLUS test strip TEST BLOOD SUGAR TWO TIMES DAILY AND AS NEEDED  . ACCU-CHEK SOFTCLIX LANCETS lancets TEST BLOOD SUGAR TWO TIMES DAILY  AND AS NEEDED  . acetaminophen (TYLENOL) 500 MG tablet Take 500 mg by mouth every 6 (six) hours as needed. Reported on 07/09/2015  . aspirin  81 MG EC tablet Take 1 tablet (81 mg total) by mouth daily. Swallow whole.  Marland Kitchen atorvastatin (LIPITOR) 40 MG tablet TAKE 1 TABLET EVERY DAY  . B-D 3CC LUER-LOK SYR 25GX1" 25G X 1" 3 ML MISC USE TO INJECT CYANOCOBALAMIN (VITAMIN B-12)  . Blood Glucose Monitoring Suppl (BLOOD GLUCOSE MONITOR SYSTEM) w/Device KIT Test BS BID and PRN. Dx E11.9  . Cholecalciferol (VITAMIN D3) 5000 units CAPS Take 1 capsule by mouth daily.  . cyanocobalamin (,VITAMIN B-12,) 1000 MCG/ML injection INJECT 1ML IM MONTHLY  . esomeprazole (NEXIUM) 40 MG capsule Take 1 capsule (40 mg total) by mouth daily.  Marland Kitchen glimepiride (AMARYL) 4 MG tablet TAKE 2 TABLETS (8 MG TOTAL) DAILY BEFORE BREAKFAST  . lisinopril (PRINIVIL,ZESTRIL) 5 MG tablet TAKE 1 TABLET EVERY DAY  . meloxicam (MOBIC) 15 MG tablet TAKE 1/2 TO 1 TABLET DAILY AS NEEDED  . pramipexole (MIRAPEX) 0.25 MG tablet TAKE 1 TABLET AT BEDTIME  . ranitidine (ZANTAC) 150 MG tablet Take 1 tablet (150 mg total) by mouth 2 (two) times daily.  . traZODone (DESYREL) 50 MG tablet TAKE 1 TABLET AT BEDTIME  . [DISCONTINUED] esomeprazole (NEXIUM) 40 MG capsule TAKE 1 CAPSULE (40 MG TOTAL) BY MOUTH DAILY.  . [DISCONTINUED] ranitidine (ZANTAC) 150 MG tablet TAKE 1 TABLET TWICE DAILY  . [DISCONTINUED] Triamcinolone Acetonide (NASACORT ALLERGY 24HR NA) Place 1 spray into the nose 2 (two) times daily.  . [DISCONTINUED] Vitamin D, Ergocalciferol, (DRISDOL) 50000 units CAPS capsule TAKE 1 CAPSULE EVERY 7 DAYS.   No facility-administered encounter medications on file as of 02/06/2018.     Activities of Daily Living In your present state of health, do you have any difficulty performing the following activities: 02/06/2018  Hearing? Y  Comment patient notices some decreased hearing, interested in seeing an audiologist near her home  Vision? N  Difficulty concentrating or making decisions? N  Walking or climbing stairs? N  Dressing or bathing? N  Doing errands, shopping? N  Preparing Food  and eating ? N  Using the Toilet? N  In the past six months, have you accidently leaked urine? N  Do you have problems with loss of bowel control? N  Managing your Medications? N  Managing your Finances? N  Housekeeping or managing your Housekeeping? N  Some recent data might be hidden  Patient will let us know if she needs a referral to audiologist that she plans to see in [place], [place].   Patient Care Team: Chipper Herb, MD as PCP - General (Family Medicine)    Assessment:   This is a routine wellness examination for [place].  Exercise Activities and Dietary recommendations Current Exercise Habits: The patient does not participate in regular exercise at present  Patient babysits her 2 young grandchildren and states she is very active during the day keeping up with them.   She eats 3 meals per day - at least 3-5 servings of vegetables and fruit per day.  States she does not watch her carbohydrate intake closely, and plans to improve on this.  Recommended diet of mostly lean proteins, vegetables, and fruits and whole grains in moderation.   Goals    . HEMOGLOBIN A1C < 7.0 (pt-stated)     Patient plans to monitor her carbohydrate intake more closely to improve Hbg A1c to less than 7%.        Fall Risk Fall Risk  02/06/2018 12/06/2017 05/11/2017 01/05/2017 10/18/2016  Falls in the past year? Yes No No No No  Number falls in past yr: 1 - - - -  Injury with Fall? No - - - -  Follow up Falls evaluation completed;Education provided - - - -   Is the patient's home free of loose throw rugs in walkways, pet beds, electrical cords, etc?   yes      Grab bars in the bathroom? yes      Handrails on the stairs?   no patient lives in a basement apartment and states there are no handrails for stairs to get to her apartment.  Advised her to discuss handrail installation with who she is renting from.       Adequate lighting?   yes  Depression Screen PHQ 2/9 Scores 02/06/2018 12/06/2017  05/11/2017 01/05/2017  PHQ - 2 Score 0 0 0 0  PHQ- 9 Score - - - -     Cognitive Function MMSE - Mini Mental State Exam 02/06/2018 09/20/2016 08/20/2015  Orientation to time _0 Orientation to Place _1 Registration _2 Attention/ Calculation _3 Recall _4 Language- name 2 objects _5 Language- repeat _6 Language- follow 3 step command _7 Language- read & follow direction _8 Write a sentence _9 Copy design _10 Total score _11 Immunization History  Administered Date(s) Administered  . Influenza, High Dose Seasonal PF 05/11/2017  . Influenza,inj,Quad PF,6+ Mos 04/22/2014  . Influenza-Unspecified 04/23/2013, 05/02/2016  . Pneumococcal Conjugate-13 07/15/2013  . Pneumococcal Polysaccharide-23 08/20/2015    Qualifies for Shingles Vaccine? Declined today  Screening Tests Health Maintenance  Topic Date Due  . OPHTHALMOLOGY EXAM  07/24/2017  . TETANUS/TDAP  07/24/2017  . MAMMOGRAM  11/14/2017  . INFLUENZA VACCINE  02/21/2018  . HEMOGLOBIN A1C  06/08/2018  . COLONOSCOPY  07/24/2018  . PAP SMEAR  10/19/2018  . FOOT EXAM  12/07/2018  . DEXA SCAN  08/02/2021  . Hepatitis C Screening  Completed  . PNA vac Low Risk Adult  Completed   Patient plans to have eye exam done in January 2020.  She has changed her insurance to include vision and it will start in 2020.  She states she got  her tdap vaccine at [facility] in [place], [place] recently, requested that she bring a copy of the vaccine record to next appointment with Dr. Laurance Flatten.   Cancer Screenings: Lung: Low Dose CT Chest recommended if Age 54-80 years, 30 pack-year currently smoking OR have quit w/in 15years. Patient does qualify. Breast:  Up to date on Mammogram? Yes   Up to date of Bone Density/Dexa? Yes Colorectal: up to date  Additional Screenings:  Hepatitis C Screening: completed 09/20/16     Plan:   Review the information given on Advance Directives, and if you  complete this please bring our office a copy to be filed in your medical record. Continue to work on your goal of reducing your Hemoglobin A1c to less than 7% by watching your carbohydrate intake.  Bring copy of tdap record to next appointment with Dr. Laurance Flatten so that your medical record may be updated.    I have personally reviewed and noted the following in the patient's chart:   . Medical and social history . Use of alcohol, tobacco or illicit drugs  . Current medications and supplements . Functional ability and status . Nutritional status . Physical activity . Advanced directives . List of other physicians . Hospitalizations, surgeries, and ER visits in previous 12 months . Vitals . Screenings to include cognitive, depression, and falls . Referrals and appointments  In addition, I have reviewed and discussed with patient certain preventive protocols, quality metrics, and best practice recommendations. A written personalized care plan for preventive services as well as general preventive health recommendations were provided to patient.     WYATT, AMY M, RN  02/06/2018   I have reviewed and agree with the above AWV documentation.  Claretta Fraise, M.D.

## 2018-02-06 NOTE — Progress Notes (Signed)
Patient is at [facility] today for her AWV.  She states that a screening chest CT scan was ordered for her, and she requested to have done at [facility].  She has not heard anything regarding the appointment.  After looking into the referral I found that per Burt Ek, [facility] Medicare no longer covers screening  Chest CT scans.  Dr. Laurance Flatten please advise whether there is another diagnosis that could be used for the chest CT, or if patient can postpone having the test completed.

## 2018-02-07 NOTE — Progress Notes (Signed)
CT chest - re-ordered with attached Dx

## 2018-02-20 ENCOUNTER — Telehealth: Payer: Self-pay

## 2018-02-20 NOTE — Telephone Encounter (Signed)
Theresa Wilson and Theresa Wilson please let me know how we can code this differently so that patient will not have to pay so much money if that coding differently is possible.

## 2018-02-20 NOTE — Telephone Encounter (Signed)
To be honest, her best bet is to go to [place] (I think) Free standing facilities are cheaper than hospital based. We can call her to see if she would be willing to do that

## 2018-02-20 NOTE — Telephone Encounter (Signed)
Thanks Glendell Docker and.  Please call patient and see if she would be willing to do this to get a cheaper cost.

## 2018-02-20 NOTE — Telephone Encounter (Signed)
Patient scheduled for CT chest and wants to know if we could possibly code it differently because she is going to have to pay $180.00   ( we coded as pulmonary nodule, Dont know of anyway it would be paid for more with any other code)

## 2018-03-01 ENCOUNTER — Other Ambulatory Visit: Payer: Self-pay

## 2018-03-01 DIAGNOSIS — Z1239 Encounter for other screening for malignant neoplasm of breast: Secondary | ICD-10-CM

## 2018-03-07 ENCOUNTER — Ambulatory Visit (HOSPITAL_COMMUNITY)
Admission: RE | Admit: 2018-03-07 | Discharge: 2018-03-07 | Disposition: A | Discharge status: Home / Self Care | Payer: Medicare PPO | Source: Ambulatory Visit | Attending: Family Medicine | Admitting: Family Medicine

## 2018-03-07 DIAGNOSIS — I728 Aneurysm of other specified arteries: Secondary | ICD-10-CM | POA: Insufficient documentation

## 2018-03-07 DIAGNOSIS — J449 Chronic obstructive pulmonary disease, unspecified: Secondary | ICD-10-CM | POA: Diagnosis not present

## 2018-03-07 DIAGNOSIS — I2510 Atherosclerotic heart disease of native coronary artery without angina pectoris: Secondary | ICD-10-CM | POA: Insufficient documentation

## 2018-03-07 DIAGNOSIS — R9389 Abnormal findings on diagnostic imaging of other specified body structures: Secondary | ICD-10-CM

## 2018-03-07 DIAGNOSIS — I313 Pericardial effusion (noninflammatory): Secondary | ICD-10-CM | POA: Diagnosis not present

## 2018-03-07 DIAGNOSIS — K449 Diaphragmatic hernia without obstruction or gangrene: Secondary | ICD-10-CM | POA: Diagnosis not present

## 2018-03-07 DIAGNOSIS — Z801 Family history of malignant neoplasm of trachea, bronchus and lung: Secondary | ICD-10-CM | POA: Insufficient documentation

## 2018-03-07 DIAGNOSIS — I700 Atherosclerosis of aorta: Secondary | ICD-10-CM

## 2018-03-07 DIAGNOSIS — R918 Other nonspecific abnormal finding of lung field: Secondary | ICD-10-CM | POA: Diagnosis not present

## 2018-03-07 DIAGNOSIS — R911 Solitary pulmonary nodule: Secondary | ICD-10-CM | POA: Diagnosis not present

## 2018-03-11 ENCOUNTER — Other Ambulatory Visit: Payer: Self-pay | Admitting: Family Medicine

## 2018-03-18 ENCOUNTER — Other Ambulatory Visit: Payer: Self-pay | Admitting: Family Medicine

## 2018-03-19 NOTE — Telephone Encounter (Signed)
Last seen 12/06/17  DWM

## 2018-04-08 ENCOUNTER — Other Ambulatory Visit: Payer: Self-pay | Admitting: Family Medicine

## 2018-04-09 ENCOUNTER — Other Ambulatory Visit: Payer: Self-pay | Admitting: Family Medicine

## 2018-04-11 ENCOUNTER — Ambulatory Visit: Payer: Medicare PPO | Admitting: Family Medicine

## 2018-04-18 ENCOUNTER — Other Ambulatory Visit: Payer: Self-pay | Admitting: Family Medicine

## 2018-05-08 ENCOUNTER — Other Ambulatory Visit: Payer: Self-pay | Admitting: Family Medicine

## 2018-06-17 ENCOUNTER — Other Ambulatory Visit: Payer: Self-pay | Admitting: Family Medicine

## 2018-06-24 ENCOUNTER — Other Ambulatory Visit: Payer: Self-pay | Admitting: Family Medicine

## 2018-06-24 NOTE — Telephone Encounter (Signed)
Last seen 12/06/17

## 2018-07-03 ENCOUNTER — Other Ambulatory Visit: Payer: Self-pay | Admitting: Family Medicine

## 2018-07-04 NOTE — Telephone Encounter (Signed)
Last seen 12/06/17

## 2018-10-03 ENCOUNTER — Other Ambulatory Visit: Payer: Self-pay

## 2018-10-03 ENCOUNTER — Encounter: Payer: Self-pay | Admitting: Family Medicine

## 2018-10-03 ENCOUNTER — Ambulatory Visit (INDEPENDENT_AMBULATORY_CARE_PROVIDER_SITE_OTHER): Payer: Medicare PPO | Admitting: Family Medicine

## 2018-10-03 VITALS — BP 151/86 | HR 100 | Temp 97.1°F | Ht 63.0 in | Wt 185.0 lb

## 2018-10-03 DIAGNOSIS — S39012A Strain of muscle, fascia and tendon of lower back, initial encounter: Secondary | ICD-10-CM

## 2018-10-03 MED ORDER — CYCLOBENZAPRINE HCL 10 MG PO TABS: 10.0000 mg | ORAL_TABLET | Freq: Three times a day (TID) | ORAL | 0 refills | Status: DC | PRN

## 2018-10-03 MED ORDER — METHYLPREDNISOLONE ACETATE 80 MG/ML IJ SUSP
80.0000 mg | Freq: Once | INTRAMUSCULAR | Status: AC
  Administered 2018-10-03: 80 mg via INTRAMUSCULAR

## 2018-10-03 NOTE — Progress Notes (Signed)
BP (!) 151/86 (BP Location: Right Arm, Cuff Size: Normal)   Pulse 100   Temp (!) 97.1 F (36.2 C) (Oral)   Ht 5\' 3"  (1.6 m)   Wt 185 lb (83.9 kg)   BMI 32.77 kg/m    Subjective:    Patient ID: Theresa Wilson, female    DOB: [birth year], 72 y.o.   MRN: [id number]  HPI: Theresa Wilson is a 73 y.o. female presenting on 10/03/2018 for Back Pain (x 4 days)   HPI Low back pain Patient comes in complaining of low back pain that is been going on for 4 or 5 days.  She cannot recall any specific incident that she did or trauma or falling or stretching or doing something funny to it but just says it started hurting her.  It hurts more when she gets up and gets down when she is up walking around.  She says when she lays down it actually gets better.  She denies any fevers or chills.  She is taking meloxicam and has taken a little bit of Advil and Aleve with it.  She denies any numbness or weakness or pain radiating anywhere else except for that right lower back.  She says the pain can be sharp at times and hurts more with movements  Relevant past medical, surgical, family and social history reviewed and updated as indicated. Interim medical history since our last visit reviewed. Allergies and medications reviewed and updated.  Review of Systems  Constitutional: Negative for chills and fever.  Eyes: Negative for redness and visual disturbance.  Respiratory: Negative for chest tightness and shortness of breath.   Cardiovascular: Negative for chest pain and leg swelling.  Genitourinary: Negative for dysuria and frequency.  Musculoskeletal: Positive for back pain and myalgias. Negative for arthralgias and gait problem.  Skin: Negative for rash.  Neurological: Negative for light-headedness and headaches.  Psychiatric/Behavioral: Negative for agitation and behavioral problems.  All other systems reviewed and are negative.   Per HPI unless specifically indicated above      Objective:    BP  (!) 151/86 (BP Location: Right Arm, Cuff Size: Normal)   Pulse 100   Temp (!) 97.1 F (36.2 C) (Oral)   Ht 5\' 3"  (1.6 m)   Wt 185 lb (83.9 kg)   BMI 32.77 kg/m   Wt Readings from Last 3 Encounters:  10/03/18 185 lb (83.9 kg)  02/06/18 181 lb (82.1 kg)  12/06/17 181 lb (82.1 kg)    Physical Exam Vitals signs and nursing note reviewed.  Constitutional:      General: She is not in acute distress.    Appearance: She is well-developed. She is not diaphoretic.  Eyes:     Conjunctiva/sclera: Conjunctivae normal.  Musculoskeletal: Normal range of motion.     Lumbar back: She exhibits tenderness. She exhibits normal range of motion.       Back:  Skin:    General: Skin is warm and dry.     Findings: No rash.  Neurological:     Mental Status: She is alert and oriented to person, place, and time.     Coordination: Coordination normal.  Psychiatric:        Behavior: Behavior normal.         Assessment & Plan:   Problem List Items Addressed This Visit    None    Visit Diagnoses    Strain of lumbar region, initial encounter    -  Primary   Right  lower back strain, will give Flexeril and Depo-Medrol   Relevant Medications   methylPREDNISolone acetate (DEPO-MEDROL) injection 80 mg   cyclobenzaprine (FLEXERIL) 10 MG tablet       Follow up plan: Return if symptoms worsen or fail to improve.  Counseling provided for all of the vaccine components No orders of the defined types were placed in this encounter.   Caryl Pina, MD [facility] 10/03/2018, 6:30 PM

## 2018-10-10 ENCOUNTER — Ambulatory Visit: Payer: Medicare PPO | Admitting: Family Medicine

## 2018-10-17 ENCOUNTER — Other Ambulatory Visit: Payer: Self-pay | Admitting: Family Medicine

## 2018-10-30 ENCOUNTER — Other Ambulatory Visit: Payer: Self-pay | Admitting: Family Medicine

## 2018-11-04 ENCOUNTER — Other Ambulatory Visit: Payer: Self-pay | Admitting: Family Medicine

## 2018-11-06 ENCOUNTER — Telehealth: Payer: Self-pay | Admitting: *Deleted

## 2018-11-06 MED ORDER — FAMOTIDINE 20 MG PO TABS: 20.0000 mg | ORAL_TABLET | Freq: Two times a day (BID) | ORAL | 3 refills | Status: DC

## 2018-11-06 NOTE — Telephone Encounter (Signed)
Please send new prescription for Pepcid AC 1 twice daily before breakfast and supper to [facility] for this patient give her 34-month supply with 1 refill.

## 2018-11-06 NOTE — Telephone Encounter (Signed)
Received fax notification from [facility] that patient's Ranitidine will need to be changed to a different medication because Ranitidine has been withdrawn from the market.  Please send new prescription to [facility].

## 2018-11-06 NOTE — Telephone Encounter (Signed)
Med sent to [facility]

## 2018-11-18 DIAGNOSIS — G4733 Obstructive sleep apnea (adult) (pediatric): Secondary | ICD-10-CM | POA: Diagnosis not present

## 2018-12-20 ENCOUNTER — Encounter: Payer: Self-pay | Admitting: Family Medicine

## 2018-12-20 ENCOUNTER — Other Ambulatory Visit: Payer: Self-pay

## 2018-12-20 ENCOUNTER — Ambulatory Visit (INDEPENDENT_AMBULATORY_CARE_PROVIDER_SITE_OTHER): Payer: Medicare PPO | Admitting: Family Medicine

## 2018-12-20 DIAGNOSIS — E782 Mixed hyperlipidemia: Secondary | ICD-10-CM | POA: Diagnosis not present

## 2018-12-20 DIAGNOSIS — R9389 Abnormal findings on diagnostic imaging of other specified body structures: Secondary | ICD-10-CM | POA: Diagnosis not present

## 2018-12-20 DIAGNOSIS — E559 Vitamin D deficiency, unspecified: Secondary | ICD-10-CM

## 2018-12-20 DIAGNOSIS — Z801 Family history of malignant neoplasm of trachea, bronchus and lung: Secondary | ICD-10-CM | POA: Diagnosis not present

## 2018-12-20 DIAGNOSIS — K219 Gastro-esophageal reflux disease without esophagitis: Secondary | ICD-10-CM | POA: Diagnosis not present

## 2018-12-20 DIAGNOSIS — I10 Essential (primary) hypertension: Secondary | ICD-10-CM | POA: Diagnosis not present

## 2018-12-20 DIAGNOSIS — E1129 Type 2 diabetes mellitus with other diabetic kidney complication: Secondary | ICD-10-CM

## 2018-12-20 DIAGNOSIS — R911 Solitary pulmonary nodule: Secondary | ICD-10-CM | POA: Diagnosis not present

## 2018-12-20 DIAGNOSIS — I728 Aneurysm of other specified arteries: Secondary | ICD-10-CM | POA: Diagnosis not present

## 2018-12-20 DIAGNOSIS — Z1211 Encounter for screening for malignant neoplasm of colon: Secondary | ICD-10-CM

## 2018-12-20 DIAGNOSIS — E119 Type 2 diabetes mellitus without complications: Secondary | ICD-10-CM

## 2018-12-20 DIAGNOSIS — R809 Proteinuria, unspecified: Secondary | ICD-10-CM

## 2018-12-20 MED ORDER — LISINOPRIL 5 MG PO TABS: 5.0000 mg | ORAL_TABLET | Freq: Every day | ORAL | 3 refills | Status: DC

## 2018-12-20 MED ORDER — PRAMIPEXOLE DIHYDROCHLORIDE 0.25 MG PO TABS: 0.2500 mg | ORAL_TABLET | Freq: Every day | ORAL | 3 refills | Status: DC

## 2018-12-20 MED ORDER — FAMOTIDINE 20 MG PO TABS: 20.0000 mg | ORAL_TABLET | Freq: Two times a day (BID) | ORAL | 3 refills | Status: DC

## 2018-12-20 MED ORDER — TRAZODONE HCL 50 MG PO TABS: 50.0000 mg | ORAL_TABLET | Freq: Every day | ORAL | 3 refills | Status: DC

## 2018-12-20 MED ORDER — DULAGLUTIDE 1.5 MG/0.5ML ~~LOC~~ SOAJ: SUBCUTANEOUS | 3 refills | Status: DC

## 2018-12-20 MED ORDER — MELOXICAM 15 MG PO TABS: ORAL_TABLET | ORAL | 3 refills | Status: DC

## 2018-12-20 MED ORDER — ATORVASTATIN CALCIUM 40 MG PO TABS: ORAL_TABLET | ORAL | 3 refills | Status: DC

## 2018-12-20 MED ORDER — GLIMEPIRIDE 4 MG PO TABS: ORAL_TABLET | ORAL | 3 refills | Status: DC

## 2018-12-20 NOTE — Progress Notes (Signed)
Virtual Visit Via telephone Note I connected with@ on 12/20/18 by telephone and verified that I am speaking with the correct person or authorized healthcare agent using two identifiers. Theresa Wilson is currently located at home and there are no unauthorized people in close proximity. I completed this visit while in a private location in my home .  This visit type was conducted due to national recommendations for restrictions regarding the COVID-19 Pandemic (e.g. social distancing).  This format is felt to be most appropriate for this patient at this time.  All issues noted in this document were discussed and addressed.  No physical exam was performed.    I discussed the limitations, risks, security and privacy concerns of performing an evaluation and management service by telephone and the availability of in person appointments. I also discussed with the patient that there may be a patient responsible charge related to this service. The patient expressed understanding and agreed to proceed.   Date:  12/20/2018    ID:  Theresa Wilson      1946/01/08        [id number]   Patient Care Team Patient Care Team: Chipper Herb, MD as PCP - General [facility] Medicine)  Reason for Visit: Primary Care Follow-up     History of Present Illness & Review of Systems:     [name] is a 73 y.o. year old female primary care patient that presents today for a telehealth visit.  The patient is pleasant and doing well and finds her self taking care of her grandchildren 5 days a week for her daughter.  She denies any chest pain pressure tightness or shortness of breath.  She did see the cardiologist and had a stress test done and everything was good and was told that she did not need to come back and see him anymore.  She denies any trouble with her stomach including swallowing nausea vomiting diarrhea blood in the stool or black tarry bowel movements her last colonoscopy was 10 years ago she does not  want to have another one but we will give her a Cologuard as an alternative and she is agreeable to checking this.  She is passing her water well.  She would like refills on all of her prescriptions sent to her outpatient mail order pharmacy.  She does have to take meloxicam for her hands and so she does need to stay on Pepcid and Nexium to protect her stomach.  Review of systems as stated, otherwise negative.  The patient does not have symptoms concerning for COVID-19 infection (fever, chills, cough, or new shortness of breath).      Current Medications (Verified) Allergies as of 12/20/2018      Reactions   Hydrocodone-homatropine Nausea Only   Rosuvastatin Other (See Comments)   Elevated LFTs   Sulfa Antibiotics    Pt states that she is not allergic "it makes me sick to my stomach"      Medication List       Accurate as of Dec 20, 2018  9:00 AM. If you have any questions, ask your nurse or doctor.        Accu-Chek Aviva Plus test strip Generic drug:  glucose blood TEST BLOOD SUGAR TWO TIMES DAILY AND AS NEEDED   Accu-Chek Softclix Lancets lancets TEST BLOOD SUGAR TWO TIMES DAILY  AND AS NEEDED   acetaminophen 500 MG tablet Commonly known as:  TYLENOL Take 500 mg by mouth every 6 (  six) hours as needed. Reported on 07/09/2015   aspirin 81 MG EC tablet Take 1 tablet (81 mg total) by mouth daily. Swallow whole.   atorvastatin 40 MG tablet Commonly known as:  LIPITOR TAKE 1 TABLET DAILY  (NEED TO BE SEEN BEFORE NEXT REFILL)   B-D 3CC LUER-LOK SYR 25GX1" 25G X 1" 3 ML Misc Generic drug:  SYRINGE-NEEDLE (DISP) 3 ML USE TO INJECT CYANOCOBALAMIN (VITAMIN B-12)   Blood Glucose Monitor System w/Device Kit Test BS BID and PRN. Dx E11.9   cyanocobalamin 1000 MCG/ML injection Commonly known as:  (VITAMIN B-12) INJECT  1ML INTRAMUSCULARLY EVERY MONTH   cyclobenzaprine 10 MG tablet Commonly known as:  FLEXERIL Take 1 tablet (10 mg total) by mouth 3 (three) times daily as  needed for muscle spasms.   esomeprazole 40 MG capsule Commonly known as:  NEXIUM TAKE 1 CAPSULE EVERY DAY   famotidine 20 MG tablet Commonly known as:  Pepcid Take 1 tablet (20 mg total) by mouth 2 (two) times daily.   glimepiride 4 MG tablet Commonly known as:  AMARYL TAKE 2 TABLETS (8 MG TOTAL) DAILY BEFORE BREAKFAST   lisinopril 5 MG tablet Commonly known as:  ZESTRIL TAKE 1 TABLET EVERY DAY   meloxicam 15 MG tablet Commonly known as:  MOBIC TAKE 1/2 TO 1 TABLET DAILY AS NEEDED   pramipexole 0.25 MG tablet Commonly known as:  MIRAPEX TAKE 1 TABLET AT BEDTIME   ranitidine 150 MG tablet Commonly known as:  ZANTAC TAKE 1 TABLET (150 MG TOTAL) BY MOUTH 2 (TWO) TIMES DAILY.   traZODone 50 MG tablet Commonly known as:  DESYREL TAKE 1 TABLET AT BEDTIME   Trulicity 1.5 BT/5.9RC Sopn Generic drug:  Dulaglutide INJECT  1.5MG SUBCUTANEOUSLY ONE TIME WEEKLY   Vitamin D3 125 MCG (5000 UT) Caps Take 1 capsule by mouth daily.           Allergies (Verified)    Hydrocodone-homatropine; Rosuvastatin; and Sulfa antibiotics  Past Medical History Past Medical History:  Diagnosis Date  . Allergy   . Aneurysm of splenic artery (HCC)   . Cataract   . Diabetes mellitus without complication ([place])   . Hyperlipidemia   . Hypertension   . Restless leg syndrome      Past Surgical History:  Procedure Laterality Date  . OVARIAN CYST REMOVAL      Social History   Socioeconomic History  . Marital status: Divorced    Spouse name: Not on file  . Number of children: 2  . Years of education: Not on file  . Highest education level: Not on file  Occupational History  . Occupation: Secretary/administrator  Social Needs  . Financial resource strain: Not on file  . Food insecurity:    Worry: Not on file    Inability: Not on file  . Transportation needs:    Medical: Not on file    Non-medical: Not on file  Tobacco Use  . Smoking status: Former Smoker    Packs/day: 0.50    Years:  45.00    Pack years: 22.50    Types: Cigarettes    Last attempt to quit: 11/11/2008    Years since quitting: 10.1  . Smokeless tobacco: Never Used  Substance and Sexual Activity  . Alcohol use: No    Comment: occassional  . Drug use: No  . Sexual activity: Not Currently  Lifestyle  . Physical activity:    Days per week: Not on file    Minutes per session:  Not on file  . Stress: Not on file  Relationships  . Social connections:    Talks on phone: Not on file    Gets together: Not on file    Attends religious service: Not on file    Active member of club or organization: Not on file    Attends meetings of clubs or organizations: Not on file    Relationship status: Not on file  Other Topics Concern  . 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      Family History  Problem Relation Age of Onset  . Heart attack Mother 3       Died age 17  . Hypertension Mother   . Hypertension Father   . Heart disease Father   . Congestive Heart Failure Father        Lived to age 25  . Cancer Sister        lung  . Hyperlipidemia Sister   . Hypertension Sister       Labs/Other Tests and Data Reviewed:    Wt Readings from Last 3 Encounters:  10/03/18 185 lb (83.9 kg)  02/06/18 181 lb (82.1 kg)  12/06/17 181 lb (82.1 kg)   Temp Readings from Last 3 Encounters:  10/03/18 (!) 97.1 F (36.2 C) (Oral)  12/06/17 (!) 97.4 F (36.3 C) (Oral)  05/11/17 (!) 97.2 F (36.2 C) (Oral)   BP Readings from Last 3 Encounters:  10/03/18 (!) 151/86  02/06/18 119/68  12/06/17 111/65   Pulse Readings from Last 3 Encounters:  10/03/18 100  02/06/18 75  12/06/17 98     Lab Results  Component Value Date   HGBA1C 7.2 (H) 12/06/2017   HGBA1C 6.6 05/11/2017   HGBA1C 7.1 (H) 01/05/2017   Lab Results  Component Value Date   MICROALBUR pos+ 50 01/21/2014   LDLCALC 84 12/06/2017   CREATININE 0.88 12/06/2017       Chemistry       Component Value Date/Time   NA 139 12/06/2017 1246   K 4.6 12/06/2017 1246   CL 100 12/06/2017 1246   CO2 24 12/06/2017 1246   BUN 18 12/06/2017 1246   CREATININE 0.88 12/06/2017 1246   CREATININE 0.87 11/11/2012 1647      Component Value Date/Time   CALCIUM 10.4 (H) 12/06/2017 1246   ALKPHOS 127 (H) 12/06/2017 1246   AST 24 12/06/2017 1246   ALT 31 12/06/2017 1246   BILITOT 0.5 12/06/2017 1246         OBSERVATIONS/ OBJECTIVE:     Patient is pleasant and responded appropriately to all questions asked of her.  She did have an eye exam 8 to 9 months ago.  She will bring a copy of that record with her to the office when she gets her blood work.  She also had a tetanus shot given and will bring a copy of that date to the office at the time of the blood work also.  She has not checked blood pressures regularly but a recent one was 117/64.  Her weight is up slightly and is now 189.  Her fasting blood sugars have been running about 125.  She says the left foot is slightly puffy on the top but no redness and no sores and she will keep a close check on this.  She does have a history of a pulmonary nodule and her sister died with lung cancer.  She understands she needs to get a repeat CT scan low-dose of the lungs along with another reason which is a splenic artery aneurysm that needs to be followed up.  She is requesting refills on all of her medicines.  Physical exam deferred due to nature of telephonic visit.  ASSESSMENT & PLAN    Time:   Today, I have spent 26 minutes with the patient via telephone discussing the above including Covid precautions.     Visit Diagnoses: 1. Abnormal chest CT -Arrange for repeat low-dose chest CT as recommended by radiology for pulmonary nodules and splenic artery aneurysm  2. Family history of lung cancer -Repeat chest CT as recommended  3. Type 2 diabetes mellitus without complication, without long-term current use of insulin (HCC) -Continue to  make every effort to lose weight through diet and exercise and continue to monitor blood sugars closely  4. Essential hypertension -Check blood pressures more regularly and watch sodium intake  5. Gastroesophageal reflux disease, esophagitis presence not specified -Continue Nexium before breakfast and Pepcid AC before supper because of her need to have to take meloxicam for arthritis in her hands.  Take as little meloxicam as possible  6. Splenic artery aneurysm ([place]) -Get repeat chest CT to follow-up on this as recommended  7. Mixed hyperlipidemia -Continue with aggressive therapeutic lifestyle changes atorvastatin and all efforts to lose weight with diet and exercise  8. Vitamin D deficiency -Continue vitamin D replacement pending results of lab work  9. Pulmonary nodule -Get chest CT as recommended  10. Type 2 diabetes mellitus with microalbuminuria, without long-term current use of insulin (HCC) -Continue aggressive therapeutic lifestyle changes  Patient Instructions  Please plan to get repeat CT scan as recommended by radiology because of pulmonary nodule Continue to monitor this as recommended by radiologist, also, they will continue to evaluate the splenic artery aneurysm. Continue to practice good respiratory and hand hygiene Continue to keep feet checked regularly and keep blood sugars under the best control possible with diet and exercise to achieve weight loss Get eyes examined yearly Get colonoscopies as recommended by gastroenterologist and if patient does not want to do this at least do a Cologuard. Arrange to come to the office to get fasting blood work and this should be done at least every 4 months. Always avoid NSAIDs as much as possible as these can make diabetes worse irritate the stomach and cause an elevated blood pressure.  Take Tylenol if needed for aches pains and fever or joint pain.       The above assessment and management plan was discussed with the  patient. The patient verbalized understanding of and has agreed to the management plan. Patient is aware to call the clinic if symptoms persist or worsen. Patient is aware when to return to the clinic for a follow-up visit. Patient educated on when it is appropriate to go to the emergency department.    Chipper Herb, MD [address], [place], [place] [address] Ph [phone]   Arrie Senate MD

## 2018-12-20 NOTE — Patient Instructions (Addendum)
Please plan to get repeat CT scan as recommended by radiology because of pulmonary nodule Continue to monitor this as recommended by radiologist, also, they will continue to evaluate the splenic artery aneurysm. Continue to practice good respiratory and hand hygiene Continue to keep feet checked regularly and keep blood sugars under the best control possible with diet and exercise to achieve weight loss Get eyes examined yearly Get colonoscopies as recommended by gastroenterologist and if patient does not want to do this at least do a Cologuard. Arrange to come to the office to get fasting blood work and this should be done at least every 4 months. Always avoid NSAIDs as much as possible as these can make diabetes worse irritate the stomach and cause an elevated blood pressure.  Take Tylenol if needed for aches pains and fever or joint pain.

## 2018-12-20 NOTE — Addendum Note (Signed)
Addended by: Zannie Cove on: 12/20/2018 01:35 PM   Modules accepted: Orders

## 2019-02-14 ENCOUNTER — Other Ambulatory Visit: Payer: Self-pay | Admitting: Family Medicine

## 2019-03-22 ENCOUNTER — Other Ambulatory Visit: Payer: Self-pay | Admitting: Family Medicine

## 2019-03-22 ENCOUNTER — Other Ambulatory Visit: Payer: Self-pay | Admitting: Physician Assistant

## 2019-05-22 ENCOUNTER — Other Ambulatory Visit: Payer: Self-pay

## 2019-05-23 ENCOUNTER — Encounter: Payer: Self-pay | Admitting: Family Medicine

## 2019-05-23 ENCOUNTER — Ambulatory Visit (INDEPENDENT_AMBULATORY_CARE_PROVIDER_SITE_OTHER): Payer: Medicare PPO | Admitting: Family Medicine

## 2019-05-23 VITALS — BP 125/77 | HR 92 | Temp 97.8°F | Ht 63.0 in | Wt 184.0 lb

## 2019-05-23 DIAGNOSIS — E1159 Type 2 diabetes mellitus with other circulatory complications: Secondary | ICD-10-CM | POA: Diagnosis not present

## 2019-05-23 DIAGNOSIS — E1165 Type 2 diabetes mellitus with hyperglycemia: Secondary | ICD-10-CM

## 2019-05-23 DIAGNOSIS — E785 Hyperlipidemia, unspecified: Secondary | ICD-10-CM

## 2019-05-23 DIAGNOSIS — G2581 Restless legs syndrome: Secondary | ICD-10-CM

## 2019-05-23 DIAGNOSIS — E1169 Type 2 diabetes mellitus with other specified complication: Secondary | ICD-10-CM | POA: Insufficient documentation

## 2019-05-23 DIAGNOSIS — I10 Essential (primary) hypertension: Secondary | ICD-10-CM

## 2019-05-23 DIAGNOSIS — E538 Deficiency of other specified B group vitamins: Secondary | ICD-10-CM

## 2019-05-23 DIAGNOSIS — F5101 Primary insomnia: Secondary | ICD-10-CM

## 2019-05-23 DIAGNOSIS — Z2821 Immunization not carried out because of patient refusal: Secondary | ICD-10-CM

## 2019-05-23 LAB — BAYER DCA HB A1C WAIVED: HB A1C (BAYER DCA - WAIVED): 9 % — ABNORMAL HIGH (ref <7.0)

## 2019-05-23 MED ORDER — ACCU-CHEK AVIVA PLUS VI STRP: ORAL_STRIP | 3 refills | Status: DC

## 2019-05-23 MED ORDER — ACCU-CHEK SOFTCLIX LANCETS MISC: 2 refills | Status: DC

## 2019-05-23 MED ORDER — LISINOPRIL 5 MG PO TABS: 5.0000 mg | ORAL_TABLET | Freq: Every day | ORAL | 3 refills | Status: DC

## 2019-05-23 MED ORDER — MELOXICAM 15 MG PO TABS: ORAL_TABLET | ORAL | 3 refills | Status: DC

## 2019-05-23 MED ORDER — TRAZODONE HCL 50 MG PO TABS: 75.0000 mg | ORAL_TABLET | Freq: Every day | ORAL | 3 refills | Status: DC

## 2019-05-23 MED ORDER — ATORVASTATIN CALCIUM 40 MG PO TABS: ORAL_TABLET | ORAL | 3 refills | Status: DC

## 2019-05-23 MED ORDER — FAMOTIDINE 20 MG PO TABS: 20.0000 mg | ORAL_TABLET | Freq: Two times a day (BID) | ORAL | 3 refills | Status: DC

## 2019-05-23 MED ORDER — PRAMIPEXOLE DIHYDROCHLORIDE 0.5 MG PO TABS: 0.5000 mg | ORAL_TABLET | Freq: Every day | ORAL | 3 refills | Status: DC

## 2019-05-23 MED ORDER — BLOOD GLUCOSE METER KIT: PACK | 0 refills | Status: DC

## 2019-05-23 MED ORDER — GLIMEPIRIDE 4 MG PO TABS: ORAL_TABLET | ORAL | 3 refills | Status: DC

## 2019-05-23 MED ORDER — ESOMEPRAZOLE MAGNESIUM 40 MG PO CPDR: 40.0000 mg | DELAYED_RELEASE_CAPSULE | Freq: Every day | ORAL | 3 refills | Status: DC

## 2019-05-23 MED ORDER — SITAGLIPTIN PHOSPHATE 50 MG PO TABS: 50.0000 mg | ORAL_TABLET | Freq: Every day | ORAL | 0 refills | Status: DC

## 2019-05-23 MED ORDER — TRULICITY 1.5 MG/0.5ML ~~LOC~~ SOAJ: SUBCUTANEOUS | 3 refills | Status: DC

## 2019-05-23 NOTE — Patient Instructions (Signed)
Meloxicam can increase acid reflux and cause stomach pain.  Use this medication sparingly  I have increased your trazodone dose to 1.5 tablets at bedtime.  I have increased the Mirapex to 0.55m tablets.  We have added Januvia 50 mg for your uncontrolled diabetes.  Your A1c today was 9.0.  We will recheck this level in 3 months, sooner if needed.  Continue to monitor blood pressures closely.  I am sending in your prescription for a new testing kit, lancets and strips.  Bring in your blood sugar log at next visit.  Get your diabetic eye exam done and have them send me the results.  You had labs performed today.  You will be contacted with the results of the labs once they are available, usually in the next 3 business days for routine lab work.  If you have an active my chart account, they will be released to your MyChart.  If you prefer to have these labs released to you via telephone, please let [email or web address].  If you had a pap smear or biopsy performed, expect to be contacted in about 7-10 days.

## 2019-05-23 NOTE — Progress Notes (Signed)
Subjective: CC: DM PCP: Theresa Norlander, Theresa Wilson TDV:VOHYW K Barcelo is a 73 y.o. female presenting to clinic today for:  1. Type 2 Diabetes with hypertension and hyperlipidemia:  Patient reports: Glucometer: Accu-Chek.  She needs a new meter., High at home: 200s; Low at home: 100s.  Fasting blood sugar this morning was 168, Taking medication(s): Trulicity 1.5 subcu every week and Amaryl 8 mg every morning.  She takes lisinopril 5 mg daily for blood pressure and Lipitor 40 mg daily for cholesterol.  She has history of intolerance to Metformin given her significant GI side effects including diarrhea and bloating.  She was on this for several years before discontinuing.  No history of hospitalization for diabetes.  Of note, A1c 10.6 from a HH check.  Last eye exam: Needs. Last foot exam: Needs Last A1c:  Lab Results  Component Value Date   HGBA1C 9.0 (H) 05/23/2019   Nephropathy screen indicated?:  On ACE inhibitor Last flu, zoster and/or pneumovax:  Immunization History  Administered Date(s) Administered  . DT 08/22/2006  . Influenza Whole 04/08/2010  . Influenza, High Dose Seasonal PF 05/11/2017  . Influenza,inj,Quad PF,6+ Mos 04/22/2014  . Influenza-Unspecified 04/23/2013, 05/02/2016, 04/06/2018  . Pneumococcal Conjugate-13 07/15/2013  . Pneumococcal Polysaccharide-23 05/31/2010, 08/20/2015  . Tdap 01/12/2018    ROS: Denies dizziness, LOC, polyuria, polydipsia, unintended weight loss/gain, foot ulcerations, numbness or tingling in extremities, shortness of breath or chest pain.  2.  Restless leg/B12 deficiency Patient reports uncontrolled restless leg syndrome despite use of Mirapex 0.25 mg at bedtime.  She also reports various degrees of insomnia despite use of trazodone 50 mg daily.  She would like to see if she can go up on either of these.  Patient with history of B12 deficiency on monthly injections of B12.  She has never taken orals  3.  GERD Patient reports compliance  with Nexium 40 mg twice daily and Pepcid 20 mg twice daily.  No history of GI bleed or ulceration.  She does take meloxicam fairly regularly for right-sided hip pain.  She reports occasional edema as well.  No nausea, vomiting.  She has occ abdominal pain.  No melena/ hematochezia.    ROS: Per HPI  Allergies  Allergen Reactions  . Hydrocodone-Homatropine Nausea Only  . Rosuvastatin Other (See Comments)    Elevated LFTs  . Sulfa Antibiotics     Pt states that she is not allergic "it makes me sick to my stomach"   Past Medical History:  Diagnosis Date  . Allergy   . Aneurysm of splenic artery (HCC)   . Cataract   . Diabetes mellitus without complication ([place])   . Hyperlipidemia   . Hypertension   . Restless leg syndrome     Current Outpatient Medications:  .  ACCU-CHEK AVIVA PLUS test strip, TEST BLOOD SUGAR TWO TIMES DAILY AND AS NEEDED, Disp: 180 each, Rfl: 3 .  ACCU-CHEK SOFTCLIX LANCETS lancets, TEST BLOOD SUGAR TWO TIMES DAILY  AND AS NEEDED, Disp: 100 each, Rfl: 2 .  acetaminophen (TYLENOL) 500 MG tablet, Take 500 mg by mouth every 6 (six) hours as needed. Reported on 07/09/2015, Disp: , Rfl:  .  aspirin 81 MG EC tablet, Take 1 tablet (81 mg total) by mouth daily. Swallow whole., Disp: 30 tablet, Rfl: 12 .  atorvastatin (LIPITOR) 40 MG tablet, TAKE 1 TABLET DAILY  (NEED TO BE SEEN BEFORE NEXT REFILL), Disp: 90 tablet, Rfl: 3 .  B-D 3CC LUER-LOK SYR 25GX1" 25G X 1"  3 ML MISC, USE TO INJECT CYANOCOBALAMIN (VITAMIN B-12), Disp: 20 each, Rfl: 0 .  Blood Glucose Monitoring Suppl (BLOOD GLUCOSE MONITOR SYSTEM) w/Device KIT, Test BS BID and PRN. Dx E11.9, Disp: 1 each, Rfl: 1 .  Cholecalciferol (VITAMIN D3) 5000 units CAPS, Take 1 capsule by mouth daily., Disp: , Rfl:  .  cyanocobalamin (,VITAMIN B-12,) 1000 MCG/ML injection, INJECT  1ML INTRAMUSCULARLY EVERY MONTH, Disp: 3 mL, Rfl: 0 .  cyclobenzaprine (FLEXERIL) 10 MG tablet, Take 1 tablet (10 mg total) by mouth 3 (three) times  daily as needed for muscle spasms., Disp: 30 tablet, Rfl: 0 .  esomeprazole (NEXIUM) 40 MG capsule, TAKE 1 CAPSULE EVERY DAY, Disp: 90 capsule, Rfl: 0 .  famotidine (PEPCID) 20 MG tablet, Take 1 tablet (20 mg total) by mouth 2 (two) times daily., Disp: 180 tablet, Rfl: 3 .  glimepiride (AMARYL) 4 MG tablet, TAKE 2 TABLETS (8 MG TOTAL) DAILY BEFORE BREAKFAST, Disp: 180 tablet, Rfl: 3 .  lisinopril (ZESTRIL) 5 MG tablet, Take 1 tablet (5 mg total) by mouth daily., Disp: 90 tablet, Rfl: 3 .  meloxicam (MOBIC) 15 MG tablet, TAKE 1/2 TO 1 TABLET DAILY AS NEEDED, Disp: 90 tablet, Rfl: 3 .  pramipexole (MIRAPEX) 0.25 MG tablet, Take 1 tablet (0.25 mg total) by mouth at bedtime., Disp: 90 tablet, Rfl: 3 .  traZODone (DESYREL) 50 MG tablet, Take 1 tablet (50 mg total) by mouth at bedtime., Disp: 90 tablet, Rfl: 3 .  TRULICITY 1.5 TM/1.9QQ SOPN, INJECT 1.5MG (1 PEN) SUBCUTANEOUSLY EVERY WEEK, Disp: 12 pen, Rfl: 0 Social History   Socioeconomic History  . Marital status: Divorced    Spouse name: Not on file  . Number of children: 2  . Years of education: Not on file  . Highest education level: Not on file  Occupational History  . Occupation: Secretary/administrator  Social Needs  . Financial resource strain: Not on file  . Food insecurity    Worry: Not on file    Inability: Not on file  . Transportation needs    Medical: Not on file    Non-medical: Not on file  Tobacco Use  . Smoking status: Former Smoker    Packs/day: 0.50    Years: 45.00    Pack years: 22.50    Types: Cigarettes    Quit date: 11/11/2008    Years since quitting: 10.5  . Smokeless tobacco: Never Used  Substance and Sexual Activity  . Alcohol use: No    Comment: occassional  . Drug use: No  . Sexual activity: Not Currently  Lifestyle  . Physical activity    Days per week: Not on file    Minutes per session: Not on file  . Stress: Not on file  Relationships  . Social Herbalist on phone: Not on file    Gets together:  Not on file    Attends religious service: Not on file    Active member of club or organization: Not on file    Attends meetings of clubs or organizations: Not on file    Relationship status: Not on file  . Intimate partner violence    Fear of current or ex partner: Not on file    Emotionally abused: Not on file    Physically abused: Not on file    Forced sexual activity: Not on file  Other Topics Concern  . 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    Family History  Problem Relation Age of Onset  . Heart attack Mother 26       Died age 65  . Hypertension Mother   . Hypertension Father   . Heart disease Father   . Congestive Heart Failure Father        Lived to age 19  . Cancer Sister        lung  . Hyperlipidemia Sister   . Hypertension Sister     Objective: Office vital signs reviewed. BP 125/77   Pulse 92   Temp 97.8 F (36.6 C) (Oral)   Ht '5\' 3"'  (1.6 m)   Wt 184 lb (83.5 kg)   SpO2 94%   BMI 32.59 kg/m   Physical Examination:  General: Awake, alert, well nourished, No acute distress HEENT: Normal. MMM Cardio: regular rate and rhythm, S1S2 heard, no murmurs appreciated Pulm: clear to auscultation bilaterally, no wheezes, rhonchi or rales; normal work of breathing on room air Extremities: warm, well perfused, No edema, cyanosis or clubbing; +2 pulses bilaterally MSK: normal gait and station Skin: dry; intact; no rashes or lesions Neuro: see DM foot  Diabetic Foot Exam - Simple   Simple Foot Form Diabetic Foot exam was performed with the following findings: Yes 05/23/2019  5:45 PM  Visual Inspection No deformities, no ulcerations, no other skin breakdown bilaterally: Yes Sensation Testing Intact to touch and monofilament testing bilaterally: Yes Pulse Check Posterior Tibialis and Dorsalis pulse intact bilaterally: Yes Comments    Recent Results (from the past 2160 hour(s))  Bayer DCA Hb  A1c Waived     Status: Abnormal   Collection Time: 05/23/19  2:36 PM  Result Value Ref Range   HB A1C (BAYER DCA - WAIVED) 9.0 (H) <7.0 %    Comment:                                       Diabetic Adult            <7.0                                       Healthy Adult        4.3 - 5.7                                                           (DCCT/NGSP) American Diabetes Association's Summary of Glycemic Recommendations for Adults with Diabetes: Hemoglobin A1c <7.0%. More stringent glycemic goals (A1c <6.0%) may further reduce complications at the cost of increased risk of hypoglycemia.    Assessment/ Plan: 73 y.o. female   1. Uncontrolled type 2 diabetes mellitus with hyperglycemia (HCC) Sugar not controlled but better 9.0.  Testing supplies sent to mail order. - Bayer DCA Hb A1c Waived - CMP14+EGFR - sitaGLIPtin (JANUVIA) 50 MG tablet; Take 1 tablet (50 mg total) by mouth daily.  Dispense: 90 tablet; Refill: 0 - glimepiride (AMARYL) 4 MG tablet; TAKE 2 TABLETS (8 MG TOTAL) DAILY BEFORE BREAKFAST  Dispense: 180 tablet; Refill: 3 - Dulaglutide (TRULICITY) 1.5 DX/8.3JA SOPN; INJECT 1.5MG (1 PEN) SUBCUTANEOUSLY EVERY WEEK  Dispense: 12 pen; Refill: 3  2. Hyperlipidemia associated with type 2 diabetes mellitus (HCC) Continue statin - CMP14+EGFR - atorvastatin (LIPITOR) 40 MG tablet; TAKE 1 TABLET DAILY  Dispense: 90 tablet; Refill: 3  3. Hypertension associated with diabetes ([place]) Controlled. - CMP14+EGFR - lisinopril (ZESTRIL) 5 MG tablet; Take 1 tablet (5 mg total) by mouth daily.  Dispense: 90 tablet; Refill: 3  4. Morbid obesity (HCC) - TSH  5. Vitamin B12 deficiency - Vitamin B12 - CBC  6. Restless legs syndrome Increase to 0.79m QHS - pramipexole (MIRAPEX) 0.5 MG tablet; Take 1 tablet (0.5 mg total) by mouth at bedtime.  Dispense: 90 tablet; Refill: 3  7. Primary insomnia Increase 731mqhs. - traZODone (DESYREL) 50 MG tablet; Take 1.5 tablets (75 mg total) by mouth  at bedtime.  Dispense: 135 tablet; Refill: 3  8. Refused influenza vaccine Counseled   Orders Placed This Encounter  Procedures  . Bayer DCA Hb A1c Waived   No orders of the defined types were placed in this encounter.    AsJanora NorlanderDO [phone]

## 2019-05-24 LAB — CBC
Hematocrit: 41.2 % (ref 34.0–46.6)
Hemoglobin: 14.2 g/dL (ref 11.1–15.9)
MCH: 29 pg (ref 26.6–33.0)
MCHC: 34.5 g/dL (ref 31.5–35.7)
MCV: 84 fL (ref 79–97)
Platelets: 291 10*3/uL (ref 150–450)
RBC: 4.89 x10E6/uL (ref 3.77–5.28)
RDW: 13.6 % (ref 11.7–15.4)
WBC: 7.6 10*3/uL (ref 3.4–10.8)

## 2019-05-24 LAB — CMP14+EGFR
ALT: 25 IU/L (ref 0–32)
AST: 22 IU/L (ref 0–40)
Albumin/Globulin Ratio: 1.4 (ref 1.2–2.2)
Albumin: 4 g/dL (ref 3.8–4.8)
Alkaline Phosphatase: 141 IU/L — ABNORMAL HIGH (ref 39–117)
BUN/Creatinine Ratio: 15 (ref 12–28)
BUN: 14 mg/dL (ref 8–27)
Bilirubin Total: 0.4 mg/dL (ref 0.0–1.2)
CO2: 23 mmol/L (ref 20–29)
Calcium: 9.9 mg/dL (ref 8.7–10.3)
Chloride: 102 mmol/L (ref 96–106)
Creatinine, Ser: 0.93 mg/dL (ref 0.57–1.00)
GFR calc Af Amer: 72 mL/min/{1.73_m2} (ref >59)
GFR calc non Af Amer: 62 mL/min/{1.73_m2} (ref >59)
Globulin, Total: 2.8 g/dL (ref 1.5–4.5)
Glucose: 103 mg/dL — ABNORMAL HIGH (ref 65–99)
Potassium: 4.2 mmol/L (ref 3.5–5.2)
Sodium: 140 mmol/L (ref 134–144)
Total Protein: 6.8 g/dL (ref 6.0–8.5)

## 2019-05-24 LAB — TSH: TSH: 0.73 u[IU]/mL (ref 0.450–4.500)

## 2019-05-24 LAB — VITAMIN B12: Vitamin B-12: 581 pg/mL (ref 232–1245)

## 2019-09-01 ENCOUNTER — Other Ambulatory Visit: Payer: Self-pay | Admitting: Family Medicine

## 2019-09-01 DIAGNOSIS — E1165 Type 2 diabetes mellitus with hyperglycemia: Secondary | ICD-10-CM

## 2019-09-30 ENCOUNTER — Other Ambulatory Visit: Payer: Self-pay | Admitting: *Deleted

## 2019-09-30 MED ORDER — CYANOCOBALAMIN 1000 MCG/ML IJ SOLN: INTRAMUSCULAR | 0 refills | Status: DC

## 2019-10-02 ENCOUNTER — Other Ambulatory Visit: Payer: Self-pay | Admitting: *Deleted

## 2019-10-21 ENCOUNTER — Other Ambulatory Visit: Payer: Self-pay | Admitting: Family Medicine

## 2019-10-21 DIAGNOSIS — E1165 Type 2 diabetes mellitus with hyperglycemia: Secondary | ICD-10-CM

## 2019-10-22 ENCOUNTER — Telehealth: Payer: Self-pay | Admitting: Family Medicine

## 2019-10-22 NOTE — Telephone Encounter (Signed)
°  Prescription Request  10/22/2019  What is the name of the medication or equipment? Water storage for c-pap machine  Have you contacted your pharmacy to request a refill? (if applicable) no  Which pharmacy would you like this sent to?[facility]   Patient notified that their request is being sent to the clinical staff for review and that they should receive a response within 2 business days.

## 2019-10-23 NOTE — Telephone Encounter (Signed)
LMOVM ppw usually comes from [facility] for [place] to sign to renew supplies

## 2019-11-28 ENCOUNTER — Telehealth: Payer: Self-pay | Admitting: *Deleted

## 2019-12-02 ENCOUNTER — Other Ambulatory Visit: Payer: Self-pay | Admitting: *Deleted

## 2019-12-02 DIAGNOSIS — E1165 Type 2 diabetes mellitus with hyperglycemia: Secondary | ICD-10-CM

## 2019-12-02 MED ORDER — SITAGLIPTIN PHOSPHATE 50 MG PO TABS: 50.0000 mg | ORAL_TABLET | Freq: Every day | ORAL | 0 refills | Status: DC

## 2019-12-02 NOTE — Telephone Encounter (Signed)
Appt made.  30 day supply give and advised to keep appointment.  No further refills will be sent until seen

## 2019-12-02 NOTE — Addendum Note (Signed)
Addended by: Wardell Heath on: 12/02/2019 09:51 AM   Modules accepted: Orders

## 2019-12-02 NOTE — Telephone Encounter (Signed)
[place]. NTBS last A1C in Oct.

## 2019-12-17 LAB — HM DIABETES EYE EXAM

## 2019-12-23 ENCOUNTER — Telehealth: Payer: Self-pay | Admitting: Family Medicine

## 2019-12-23 DIAGNOSIS — G2581 Restless legs syndrome: Secondary | ICD-10-CM

## 2019-12-23 MED ORDER — PRAMIPEXOLE DIHYDROCHLORIDE 0.5 MG PO TABS: 0.5000 mg | ORAL_TABLET | Freq: Every day | ORAL | 0 refills | Status: DC

## 2019-12-23 NOTE — Telephone Encounter (Signed)
  Prescription Request  12/23/2019  What is the name of the medication or equipment? Mirapex  Have you contacted your pharmacy to request a refill? (if applicable) Yes  [facility]  Pt has an appt to see Dr Lajuana Ripple on 01/07/20 but says she has ran out of this Rx and is requesting that enough be sent to pharmacy to last her until she can come in for appt.   Patient notified that their request is being sent to the clinical staff for review and that they should receive a response within 2 business days.

## 2019-12-23 NOTE — Telephone Encounter (Signed)
Ok to refill? Pt does have appt 01/07/20

## 2019-12-23 NOTE — Telephone Encounter (Signed)
Yes. Ok to fill

## 2019-12-23 NOTE — Telephone Encounter (Signed)
rx sent to pharmacy and pt is aware. 

## 2019-12-25 ENCOUNTER — Other Ambulatory Visit: Payer: Self-pay | Admitting: Family Medicine

## 2019-12-25 DIAGNOSIS — E1165 Type 2 diabetes mellitus with hyperglycemia: Secondary | ICD-10-CM

## 2020-01-07 ENCOUNTER — Encounter: Payer: Self-pay | Admitting: Family Medicine

## 2020-01-07 ENCOUNTER — Ambulatory Visit (INDEPENDENT_AMBULATORY_CARE_PROVIDER_SITE_OTHER): Payer: Medicare Other | Admitting: Family Medicine

## 2020-01-07 ENCOUNTER — Other Ambulatory Visit: Payer: Self-pay

## 2020-01-07 VITALS — BP 104/68 | HR 99 | Temp 97.8°F | Wt 183.0 lb

## 2020-01-07 DIAGNOSIS — I152 Hypertension secondary to endocrine disorders: Secondary | ICD-10-CM

## 2020-01-07 DIAGNOSIS — E1169 Type 2 diabetes mellitus with other specified complication: Secondary | ICD-10-CM

## 2020-01-07 DIAGNOSIS — F5101 Primary insomnia: Secondary | ICD-10-CM

## 2020-01-07 DIAGNOSIS — E1165 Type 2 diabetes mellitus with hyperglycemia: Secondary | ICD-10-CM | POA: Diagnosis not present

## 2020-01-07 DIAGNOSIS — E1159 Type 2 diabetes mellitus with other circulatory complications: Secondary | ICD-10-CM

## 2020-01-07 DIAGNOSIS — I10 Essential (primary) hypertension: Secondary | ICD-10-CM

## 2020-01-07 DIAGNOSIS — E785 Hyperlipidemia, unspecified: Secondary | ICD-10-CM

## 2020-01-07 DIAGNOSIS — G2581 Restless legs syndrome: Secondary | ICD-10-CM

## 2020-01-07 LAB — BAYER DCA HB A1C WAIVED: HB A1C (BAYER DCA - WAIVED): 10 % — ABNORMAL HIGH (ref <7.0)

## 2020-01-07 MED ORDER — MELOXICAM 15 MG PO TABS: ORAL_TABLET | ORAL | 3 refills | Status: DC

## 2020-01-07 MED ORDER — ATORVASTATIN CALCIUM 40 MG PO TABS: ORAL_TABLET | ORAL | 3 refills | Status: DC

## 2020-01-07 MED ORDER — LISINOPRIL 5 MG PO TABS: 5.0000 mg | ORAL_TABLET | Freq: Every day | ORAL | 3 refills | Status: DC

## 2020-01-07 MED ORDER — ESOMEPRAZOLE MAGNESIUM 40 MG PO CPDR: 40.0000 mg | DELAYED_RELEASE_CAPSULE | Freq: Every day | ORAL | 3 refills | Status: DC

## 2020-01-07 MED ORDER — TRULICITY 3 MG/0.5ML ~~LOC~~ SOAJ: 3.0000 mg | SUBCUTANEOUS | 1 refills | Status: DC

## 2020-01-07 MED ORDER — GLIMEPIRIDE 4 MG PO TABS: ORAL_TABLET | ORAL | 3 refills | Status: DC

## 2020-01-07 MED ORDER — PRAMIPEXOLE DIHYDROCHLORIDE 0.5 MG PO TABS: 0.5000 mg | ORAL_TABLET | Freq: Every day | ORAL | 3 refills | Status: DC

## 2020-01-07 MED ORDER — DAPAGLIFLOZIN PROPANEDIOL 5 MG PO TABS: 5.0000 mg | ORAL_TABLET | Freq: Every day | ORAL | 0 refills | Status: DC

## 2020-01-07 MED ORDER — TRAZODONE HCL 50 MG PO TABS: 75.0000 mg | ORAL_TABLET | Freq: Every day | ORAL | 3 refills | Status: DC

## 2020-01-07 NOTE — Progress Notes (Signed)
Subjective: CC: DM PCP: Theresa Norlander, DO [id number] K Theresa Wilson is a 74 y.o. female presenting to clinic today for:  1. Type 2 Diabetes with hypertension and hyperlipidemia:  Patient reports BG 200s consistently.  Taking medication(s): Trulicity 1.5 subcu every week, Januvia 50and Amaryl 8 mg every morning.  She takes lisinopril 5 mg daily for blood pressure and Lipitor 40 mg daily for cholesterol.  She reports polydipsia but denies polyuria.  No visual disturbance.  In fact vision is better since she had cataract surgery.  Last eye exam: UTD. Last foot exam: UTD Last A1c:  Lab Results  Component Value Date   HGBA1C 9.0 (H) 05/23/2019   Nephropathy screen indicated?:  On ACE inhibitor Last flu, zoster and/or pneumovax:  Immunization History  Administered Date(s) Administered   DT (Pediatric) 08/22/2006   Influenza Whole 04/08/2010   Influenza, High Dose Seasonal PF 05/11/2017   Influenza,inj,Quad PF,6+ Mos 04/22/2014   Influenza-Unspecified 04/23/2013, 05/02/2016, 04/06/2018   Pneumococcal Conjugate-13 07/15/2013   Pneumococcal Polysaccharide-23 05/31/2010, 08/20/2015   Tdap 01/12/2018    ROS: No CP, SOB, headache, LE edema.  2.  CPAP Patient notes that since her change in insurance her old [employer] will no longer send her her water reservoir for her CPAP.  She has not called around to see which company is covered by the insurance but she will contact me with this as she does want to continue using her CPAP as directed.  ROS: Per HPI  Allergies  Allergen Reactions   Hydrocodone-Homatropine Nausea Only   Rosuvastatin Other (See Comments)    Elevated LFTs   Sulfa Antibiotics     Pt states that she is not allergic "it makes me sick to my stomach"   Past Medical History:  Diagnosis Date   Allergy    Aneurysm of splenic artery (HCC)    Cataract    Diabetes mellitus without complication (HCC)    Hyperlipidemia    Hypertension    Restless  leg syndrome     Current Outpatient Medications:    Accu-Chek Softclix Lancets lancets, CHECK BLOOD SUGAR ONE TIME DAILY AS DIRECTED Dx E11.9, Disp: 100 each, Rfl: 3   acetaminophen (TYLENOL) 500 MG tablet, Take 500 mg by mouth every 6 (six) hours as needed. Reported on 07/09/2015, Disp: , Rfl:    aspirin 81 MG EC tablet, Take 1 tablet (81 mg total) by mouth daily. Swallow whole., Disp: 30 tablet, Rfl: 12   atorvastatin (LIPITOR) 40 MG tablet, TAKE 1 TABLET DAILY, Disp: 90 tablet, Rfl: 3   B-D 3CC LUER-LOK SYR 25GX1" 25G X 1" 3 ML MISC, USE TO INJECT CYANOCOBALAMIN (VITAMIN B-12), Disp: 20 each, Rfl: 0   blood glucose meter kit and supplies, Dispense based on patient and insurance preference. Check sugar once daily as directed. (FOR ICD-10 E10.9, E11.9)., Disp: 1 each, Rfl: 0   Blood Glucose Monitoring Suppl (BLOOD GLUCOSE MONITOR SYSTEM) w/Device KIT, Test BS BID and PRN. Dx E11.9, Disp: 1 each, Rfl: 1   Cholecalciferol (VITAMIN D3) 5000 units CAPS, Take 1 capsule by mouth daily., Disp: , Rfl:    cyanocobalamin (,VITAMIN B-12,) 1000 MCG/ML injection, INJECT  1ML INTRAMUSCULARLY EVERY MONTH, Disp: 3 mL, Rfl: 0   cyclobenzaprine (FLEXERIL) 10 MG tablet, Take 1 tablet (10 mg total) by mouth 3 (three) times daily as needed for muscle spasms., Disp: 30 tablet, Rfl: 0   Dulaglutide (TRULICITY) 1.5 QI/6.9GE SOPN, INJECT 1.5MG (1 PEN) SUBCUTANEOUSLY EVERY WEEK, Disp: 12 pen, Rfl: 3  esomeprazole (NEXIUM) 40 MG capsule, Take 1 capsule (40 mg total) by mouth daily., Disp: 90 capsule, Rfl: 3   famotidine (PEPCID) 20 MG tablet, Take 1 tablet (20 mg total) by mouth 2 (two) times daily., Disp: 180 tablet, Rfl: 3   glimepiride (AMARYL) 4 MG tablet, TAKE 2 TABLETS (8 MG TOTAL) DAILY BEFORE BREAKFAST, Disp: 180 tablet, Rfl: 3   glucose blood (ACCU-CHEK AVIVA PLUS) test strip, CHECK BLOOD SUGAR ONE TIME DAILY AS DIRECTED Dx E11.9, Disp: 100 strip, Rfl: 3   JANUVIA 50 MG tablet, TAKE 1 TABLET BY  MOUTH EVERY DAY, Disp: 30 tablet, Rfl: 0   lisinopril (ZESTRIL) 5 MG tablet, Take 1 tablet (5 mg total) by mouth daily., Disp: 90 tablet, Rfl: 3   meloxicam (MOBIC) 15 MG tablet, TAKE 1/2 TO 1 TABLET DAILY AS NEEDED, Disp: 90 tablet, Rfl: 3   pramipexole (MIRAPEX) 0.5 MG tablet, Take 1 tablet (0.5 mg total) by mouth at bedtime., Disp: 90 tablet, Rfl: 0   traZODone (DESYREL) 50 MG tablet, Take 1.5 tablets (75 mg total) by mouth at bedtime., Disp: 135 tablet, Rfl: 3 Social History   Socioeconomic History   Marital status: Divorced    Spouse name: Not on file   Number of children: 2   Years of education: Not on file   Highest education level: Not on file  Occupational History   Occupation: Housekeeper  Tobacco Use   Smoking status: Former Smoker    Packs/day: 0.50    Years: 45.00    Pack years: 22.50    Types: Cigarettes    Quit date: 11/11/2008    Years since quitting: 11.1   Smokeless tobacco: Never Used  Vaping Use   Vaping Use: Never used  Substance and Sexual Activity   Alcohol use: No    Comment: occassional   Drug use: No   Sexual activity: Not Currently  Other Topics Concern   Not on file  Social History Narrative   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    Social Determinants of Health   Financial Resource Strain:    Difficulty of Paying Living Expenses:   Food Insecurity:    Worried About Charity fundraiser in the Last Year:    Arboriculturist in the Last Year:   Transportation Needs:    Film/video editor (Medical):    Lack of Transportation (Non-Medical):   Physical Activity:    Days of Exercise per Week:    Minutes of Exercise per Session:   Stress:    Feeling of Stress :   Social Connections:    Frequency of Communication with Friends and Family:    Frequency of Social Gatherings with Friends and Family:    Attends Religious Services:    Active Member of Clubs or Organizations:    Attends  Music therapist:    Marital Status:   Intimate Partner Violence:    Fear of Current or Ex-Partner:    Emotionally Abused:    Physically Abused:    Sexually Abused:    Family History  Problem Relation Age of Onset   Heart attack Mother 23       Died age 74   Hypertension Mother    Hypertension Father    Heart disease Father    Congestive Heart Failure Father        Lived to age 98   Cancer Sister        lung  Hyperlipidemia Sister    Hypertension Sister     Objective: Office vital signs reviewed. BP 104/68    Pulse 99    Temp 97.8 F (36.6 C)    Wt 183 lb (83 kg)    SpO2 94%    BMI 32.42 kg/m   Physical Examination:  General: Awake, alert, well nourished, No acute distress HEENT: Normal. MMM Cardio: regular rate and rhythm, S1S2 heard, no murmurs appreciated Pulm: clear to auscultation bilaterally, no wheezes, rhonchi or rales; normal work of breathing on room air Extremities: warm, well perfused, No edema, cyanosis or clubbing; +2 pulses bilaterally  Assessment/ Plan: 73 y.o. female   1. Uncontrolled type 2 diabetes mellitus with hyperglycemia (HCC) A1c continues to rise and is 10.0 today.  I am going to discontinue the Januvia totally and we will plan to advance the Trulicity to 3 mg.  2 weeks of samples of the 1.5 were provided to the patient so that she can have a bridge supply while she waits on her shipment from the pharmacy.  I have also added [place].  No apparent contraindications.  We will plan to have her follow-up with the pharmacist in about 2 weeks to see how she is doing.  I would like to see her back in about 3 months, sooner if needed.  For now continue the Amaryl but at some point we may also consider discontinuing this and advancing the Farxiga to max dose. - Bayer DCA Hb A1c Waived - dapagliflozin propanediol (FARXIGA) 5 MG TABS tablet; Take 1 tablet (5 mg total) by mouth daily before breakfast.  Dispense: 28 tablet; Refill:  0 - Dulaglutide (TRULICITY) 3 KW/4.0XB SOPN; Inject 0.5 mLs (3 mg total) as directed once a week.  Dispense: 12 pen; Refill: 1 - glimepiride (AMARYL) 4 MG tablet; TAKE 2 TABLETS (8 MG TOTAL) DAILY BEFORE BREAKFAST  Dispense: 180 tablet; Refill: 3  2. Hyperlipidemia associated with type 2 diabetes mellitus (HCC) Continue statin - atorvastatin (LIPITOR) 40 MG tablet; TAKE 1 TABLET DAILY  Dispense: 90 tablet; Refill: 3  3. Hypertension associated with diabetes ([place]) Continue BP med - lisinopril (ZESTRIL) 5 MG tablet; Take 1 tablet (5 mg total) by mouth daily.  Dispense: 90 tablet; Refill: 3  4. Morbid obesity ([place]) Perhaps the increased dose of the Trulicity will help reduce weight as well  5. Restless legs syndrome Stable - pramipexole (MIRAPEX) 0.5 MG tablet; Take 1 tablet (0.5 mg total) by mouth at bedtime.  Dispense: 90 tablet; Refill: 3  6. Primary insomnia Stable - traZODone (DESYREL) 50 MG tablet; Take 1.5 tablets (75 mg total) by mouth at bedtime.  Dispense: 135 tablet; Refill: 3   No orders of the defined types were placed in this encounter.  No orders of the defined types were placed in this encounter.    Theresa Norlander, DO [place] [phone]

## 2020-01-08 ENCOUNTER — Telehealth: Payer: Self-pay | Admitting: Family Medicine

## 2020-01-14 NOTE — Telephone Encounter (Signed)
PA in Process Key: [id number] - PA Case ID: [id number] Drug Trulicity 3MG /0.5ML pen-injectors Form [employer] Medicare Part D Electronic Prior Authorization Form (2017 NCPDP)

## 2020-01-16 NOTE — Telephone Encounter (Signed)
This medication Trulicity or product is on your plan's list of covered drugs. Prior authorization is not required at this time. If your pharmacy has questions regarding the processing of your prescription, please have them call the [facility] pharmacy help desk at [phone].  Patient aware.

## 2020-01-20 ENCOUNTER — Other Ambulatory Visit: Payer: Self-pay | Admitting: Family Medicine

## 2020-01-20 DIAGNOSIS — E1165 Type 2 diabetes mellitus with hyperglycemia: Secondary | ICD-10-CM

## 2020-01-21 ENCOUNTER — Ambulatory Visit (INDEPENDENT_AMBULATORY_CARE_PROVIDER_SITE_OTHER): Payer: Medicare Other | Admitting: Pharmacist

## 2020-01-21 ENCOUNTER — Encounter: Payer: Self-pay | Admitting: Pharmacist

## 2020-01-21 DIAGNOSIS — E1165 Type 2 diabetes mellitus with hyperglycemia: Secondary | ICD-10-CM | POA: Diagnosis not present

## 2020-01-21 MED ORDER — DAPAGLIFLOZIN PROPANEDIOL 5 MG PO TABS: 5.0000 mg | ORAL_TABLET | Freq: Every day | ORAL | 0 refills | Status: DC

## 2020-01-21 NOTE — Progress Notes (Signed)
    01/21/2020 Name: Theresa Wilson MRN: [id number] DOB: [birth year]   S:  43 yoF presents for diabetes evaluation, education, and management Patient was referred and last seen by Primary Care Provider on 01/07/20.  Insurance coverage/medication affordability: UHC medicare  I connected with  Shikara K Nehring on 01/21/20 by a video enabled telemedicine application and verified that I am speaking with the correct person using two identifiers.   I discussed the limitations of evaluation and management by telemedicine. The patient expressed understanding and agreed to proceed.  Patient reports adherence with medications. . Current diabetes medications include: farxiga, trulicity , glimepiride . Current hypertension medications include: lisinopril Goal 130/80 . Current hyperlipidemia medications include: atorvastatin   Patient denies hypoglycemic events.   Patient reported dietary habits: Eats 2-3 meals/day  Discussed meal planning options and Plate method for healthy eating  Avoid sugary drinks and desserts  Incorporate balanced protein, non starchy veggies, 1 serving of carbohydrate  Increase water intake  Increase physical activity as able.  Patient-reported exercise habits: n/a  Patient denies nocturia (nighttime urination).  Patient denies neuropathy (nerve pain).  Patient denies visual changes.  Patient reports self foot exams.    O:  Lab Results  Component Value Date   HGBA1C 10.0 (H) 01/07/2020      Lipid Panel     Component Value Date/Time   CHOL 164 12/06/2017 1246   CHOL 178 11/11/2012 1647   TRIG 176 (H) 12/06/2017 1246   TRIG 162 (H) 08/02/2016 1038   TRIG 252 (H) 11/11/2012 1647   HDL 45 12/06/2017 1246   HDL 47 08/02/2016 1038   HDL 51 11/11/2012 1647   CHOLHDL 3.6 12/06/2017 1246   LDLCALC 84 12/06/2017 1246   LDLCALC 221 (H) 01/21/2014 1626   LDLCALC 77 11/11/2012 1647     Home fasting blood sugars: 122-170  2 hour post-meal/random blood  sugars: n/a.   A/P:  Diabetes T2DM currently UNCONTROLLED. Patient is adherent with medication. Control is suboptimal due to diet/lifestyle.  -Continued GLP-1 TRULICITY (generic name DULAGLUTIDE) 3MG  SQ WEEKLY.   PATIENT JUST STARTED 3MG  DOSE ON 01/17/20  -Continued SGLT2-I FARXIGA (generic name DAPAGLIFLOZIN) 5MG  DAILY. Counseled on sick day rules for: PATIENT TO HOLD FARXIGA IF NOT EATING/NAUSEA/VOMITING  Plan to increase to 10mg  daily if BGs persistently elevated  -Decreased glimepiride to 4mg  daily, plan to d/c   -Extensively discussed pathophysiology of diabetes, recommended lifestyle interventions, dietary effects on blood sugar control  -Counseled on s/sx of and management of hypoglycemia  -Next A1C anticipated 03/2020    Written patient instructions provided.  Total time in counseling 15 minutes.   Follow up PCP Clinic Visit in 3-6 months.    Regina Eck, PharmD, BCPS Clinical Pharmacist, [place]  II Phone [phone]

## 2020-01-23 IMAGING — CT CT CHEST W/O CM
2 of 3 series · 15 of 36 positions shown, 18 images · non-contrast
Comparison: 10/17/2016.

CLINICAL DATA: Ex-smoker. Family history of lung cancer. No current
chest symptoms.

EXAM:
CT CHEST WITHOUT CONTRAST
TECHNIQUE: Multidetector CT imaging of the chest was performed following the
standard protocol without IV contrast.

[Series 2: thorax · axial · 0.61mm/px · z∈[+1304,+1546]mm · 12 of 143 slices shown, 15 images]
[im 11/143  mediastinal]
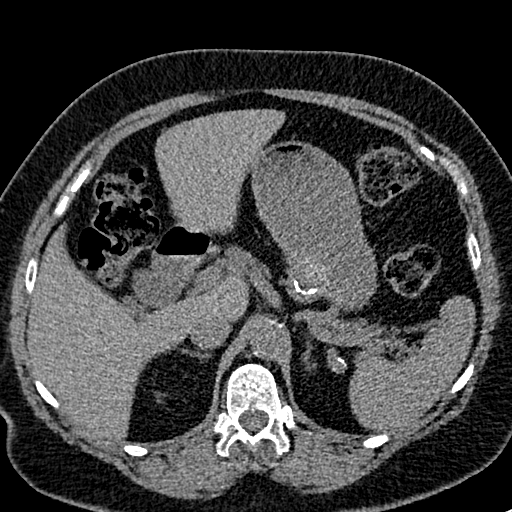
[im 11/143  lung]
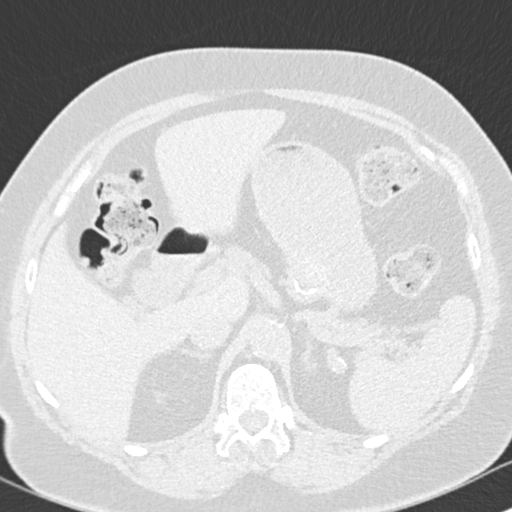
[im 22/143  lung]
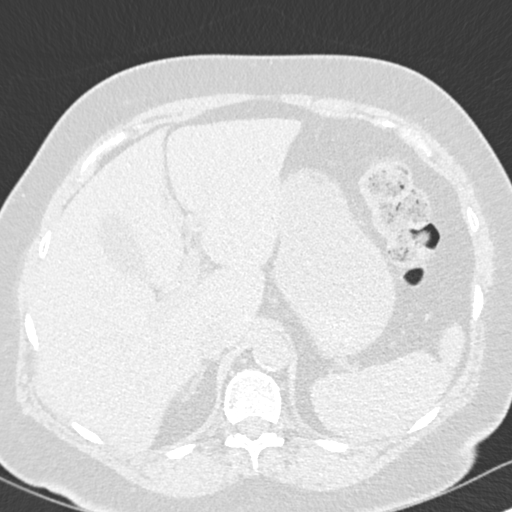
[im 32/143  lung]
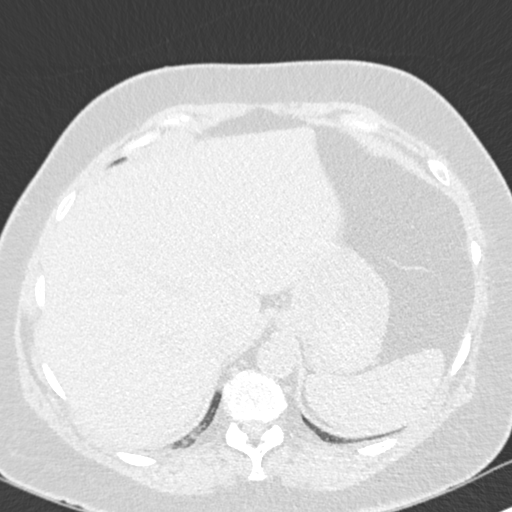
[im 43/143  lung]
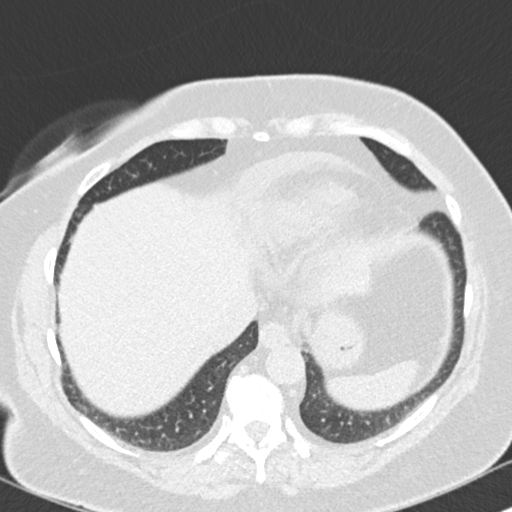
[im 53/143  mediastinal]
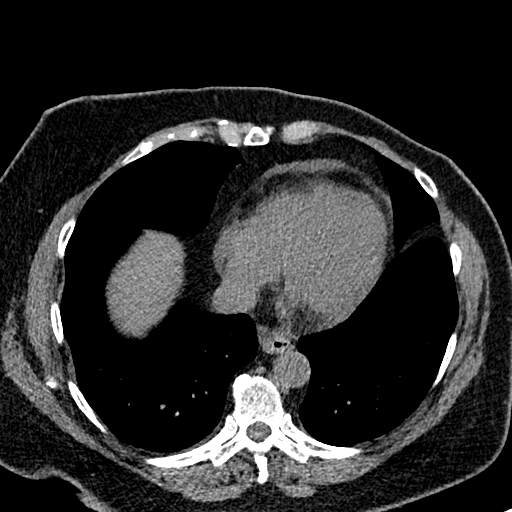
[im 53/143  lung]
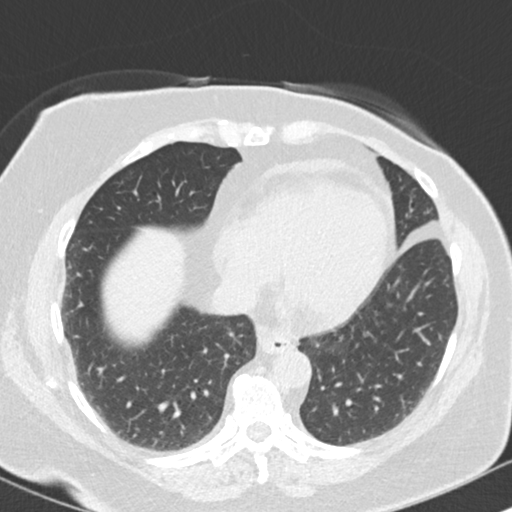
[im 64/143  lung]
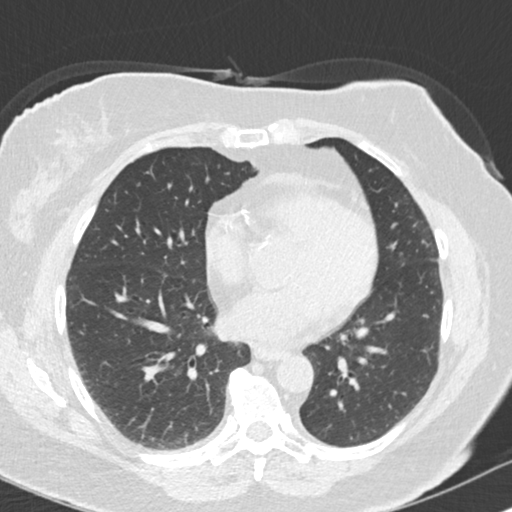
[im 79/143  lung]
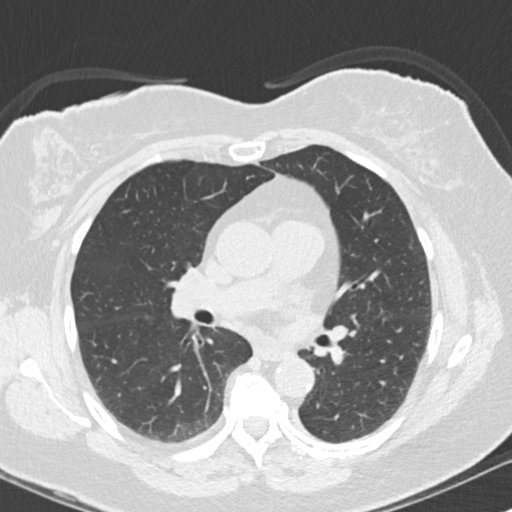
[im 90/143  lung]
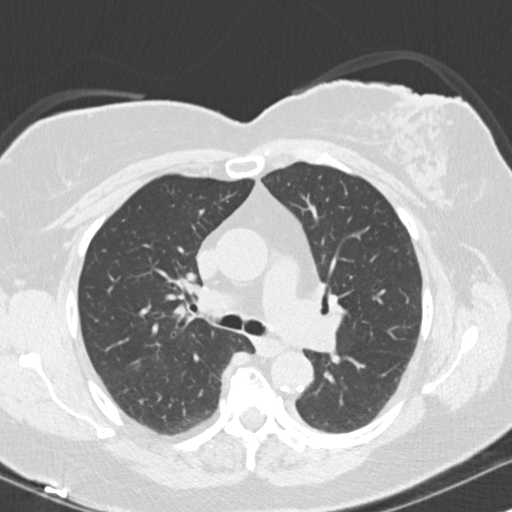
[im 100/143  mediastinal]
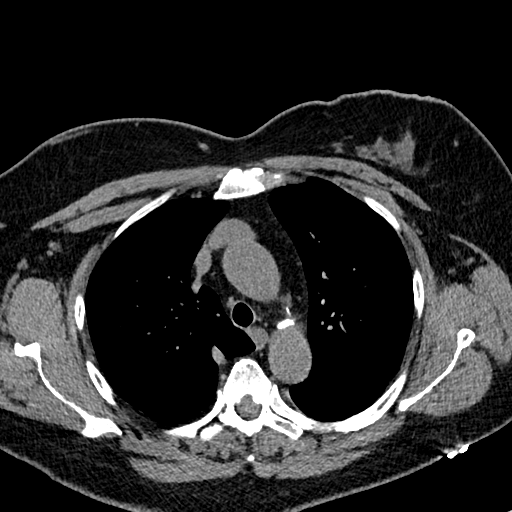
[im 100/143  lung]
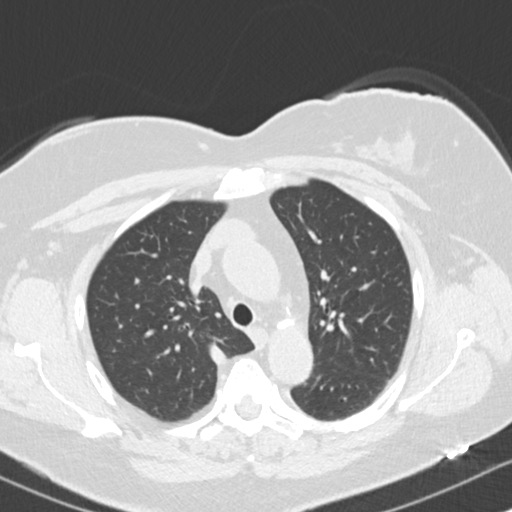
[im 111/143  lung]
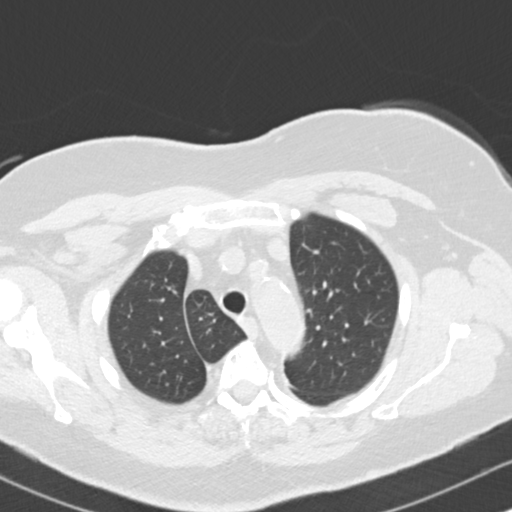
[im 121/143  lung]
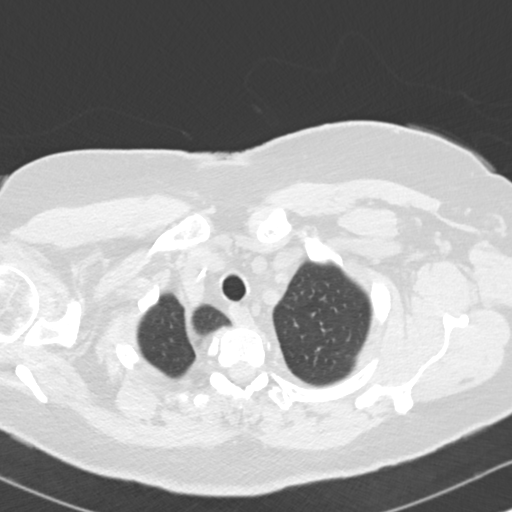
[im 132/143  lung]
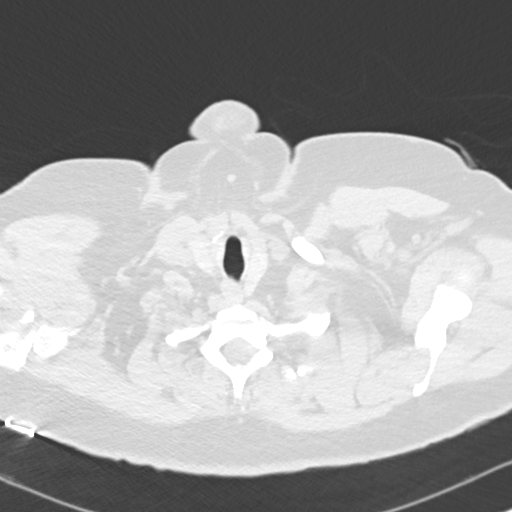

[Series 5: coronal · coronal · 0.59mm/px · 3 of 151 slices shown]
[im 31/151  lung]
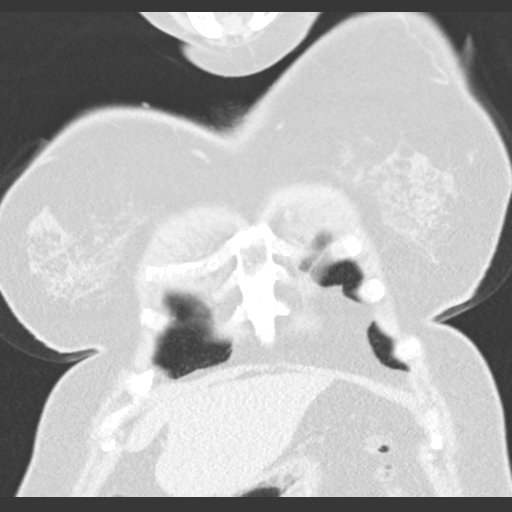
[im 61/151  lung]
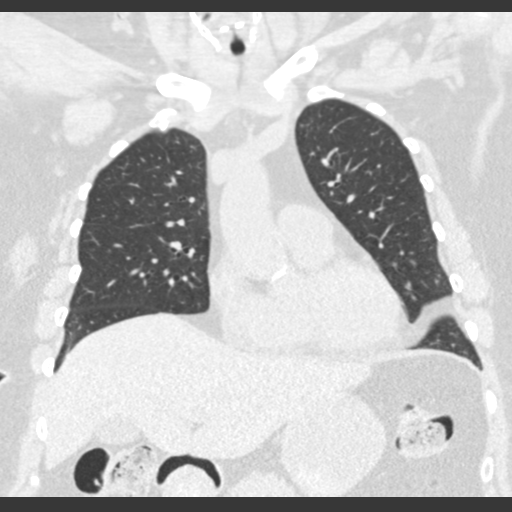
[im 91/151  lung]
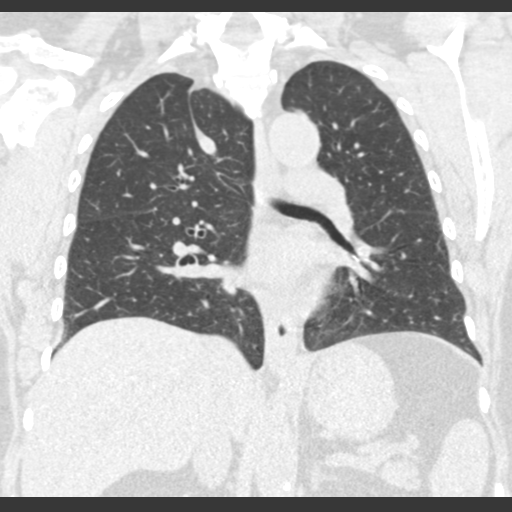

[15 of 36 positions shown; findings below may reference images not displayed]

FINDINGS: Cardiovascular: Atheromatous calcifications, including the coronary
arteries and aorta. Normal sized heart. Small pericardial effusion
with a maximum thickness of 7 mm, with little change.

Mediastinum/Nodes: Small hiatal hernia. No enlarged lymph nodes.
Mildly enlarged right lobe thyroid gland with no nodules seen.

Lungs/Pleura: Minimal right lower lobe bullous change. 3 mm superior
segment left lower lobe nodule on image number 57 of series 4,
unchanged since 10/17/2016 and 02/10/2014. No new nodules.

Upper Abdomen: 1.2 cm calcified splenic artery aneurysm without
significant change since 02/10/2014.

Musculoskeletal: Thoracic spine degenerative changes.
IMPRESSION: 1. No acute abnormality.
2. Stable small pericardial effusion.
3. Small hiatal hernia.
4. Stable 3 mm superior segment left lower lobe nodule. Recommend
low-dose non-contrast screening chest CT in 1 year.
5. Stable 1.2 cm calcified splenic artery aneurysm.
6.  Calcific coronary artery and aortic atherosclerosis.
7. Minimal changes of COPD.

Aortic Atherosclerosis (U7QX0-XCI.I) and Emphysema (U7QX0-0FO.1).

## 2020-01-27 ENCOUNTER — Telehealth: Payer: Self-pay | Admitting: Family Medicine

## 2020-01-27 DIAGNOSIS — E1165 Type 2 diabetes mellitus with hyperglycemia: Secondary | ICD-10-CM

## 2020-01-27 MED ORDER — DAPAGLIFLOZIN PROPANEDIOL 5 MG PO TABS: 10.0000 mg | ORAL_TABLET | Freq: Every day | ORAL | 2 refills | Status: DC

## 2020-01-27 NOTE — Telephone Encounter (Signed)
FBG 150-160, sometimes 120,130 (diet dependent) Recommend for patient to: -STOP glimepiride -CONTINUE Trulicity 3mg  sq weekly -INCREASE farxiga to 10mg  daily for full benefit (GFR 70s) -Encouraged patient to stay hydrated on SGLT2 therapy Counseled Sick day rules: if sick, vomiting, have diarrhea, or  cannot drink enough fluids, you should stop taking SGLT-2 inhibitors until your symptoms go away. In rare cases, these medicines can cause diabetic ketoacidosis (DKA). DKA is acid buildup in the blood.   Farxiga 10mg  daily sent to PCP for approved to be called in to [place], [place]

## 2020-01-27 NOTE — Telephone Encounter (Signed)
Agree with plan 

## 2020-01-27 NOTE — Telephone Encounter (Signed)
  Prescription Request  01/27/2020  What is the name of the medication or equipment? FARXIGA   Have you contacted your pharmacy to request a refill? (if applicable) yes  [facility] [place]   Patient notified that their request is being sent to the clinical staff for review and that they should receive a response within 2 business days.

## 2020-03-11 ENCOUNTER — Other Ambulatory Visit: Payer: Self-pay | Admitting: Family Medicine

## 2020-03-11 DIAGNOSIS — G2581 Restless legs syndrome: Secondary | ICD-10-CM

## 2020-04-16 ENCOUNTER — Ambulatory Visit (INDEPENDENT_AMBULATORY_CARE_PROVIDER_SITE_OTHER): Payer: Medicare Other | Admitting: Family

## 2020-04-16 ENCOUNTER — Encounter: Payer: Self-pay | Admitting: Family

## 2020-04-16 DIAGNOSIS — R399 Unspecified symptoms and signs involving the genitourinary system: Secondary | ICD-10-CM

## 2020-04-16 LAB — URINALYSIS, COMPLETE
Bilirubin, UA: NEGATIVE
Ketones, UA: NEGATIVE
Nitrite, UA: NEGATIVE
Protein,UA: NEGATIVE
Specific Gravity, UA: <1.005 — ABNORMAL LOW (ref 1.005–1.030)
Urobilinogen, Ur: 1 mg/dL (ref 0.2–1.0)
pH, UA: 6 (ref 5.0–7.5)

## 2020-04-16 LAB — MICROSCOPIC EXAMINATION

## 2020-04-16 MED ORDER — CEPHALEXIN 500 MG PO CAPS: 500.0000 mg | ORAL_CAPSULE | Freq: Two times a day (BID) | ORAL | 0 refills | Status: DC

## 2020-04-16 MED ORDER — BACLOFEN 10 MG PO TABS: 10.0000 mg | ORAL_TABLET | Freq: Three times a day (TID) | ORAL | 0 refills | Status: DC

## 2020-04-16 NOTE — Progress Notes (Signed)
   Virtual Visit via telephone Note Due to COVID-19 pandemic this visit was conducted virtually. This visit type was conducted due to national recommendations for restrictions regarding the COVID-19 Pandemic (e.g. social distancing, sheltering in place) in an effort to limit this patient's exposure and mitigate transmission in our community. All issues noted in this document were discussed and addressed.  A physical exam was not performed with this format.  I connected with Theresa Wilson on 04/16/20 at 9:35 AM  by telephone and verified that I am speaking with the correct person using two identifiers. Theresa Wilson is currently located at daughter's home and no one is currently with her during visit. The provider, Evelina Dun, FNP is located in their office at time of visit.  I discussed the limitations, risks, security and privacy concerns of performing an evaluation and management service by telephone and the availability of in person appointments. I also discussed with the patient that there may be a patient responsible charge related to this service. The patient expressed understanding and agreed to proceed.   History and Present Illness:  Dysuria  This is a new problem. The current episode started in the past 7 days (2 days ago). The problem has been gradually worsening. The quality of the pain is described as burning. The pain is at a severity of 1/10. The pain is mild. Associated symptoms include flank pain, frequency, hematuria, nausea and urgency. Pertinent negatives include no discharge, hesitancy or vomiting. She has tried increased fluids for the symptoms. The treatment provided mild relief.      Review of Systems  Gastrointestinal: Positive for nausea. Negative for vomiting.  Genitourinary: Positive for dysuria, flank pain, frequency, hematuria and urgency. Negative for hesitancy.  All other systems reviewed and are negative.    Observations/Objective: No SOB or distress  noted   Assessment and Plan: Theresa Wilson comes in today with chief complaint of No chief complaint on file.   Diagnosis and orders addressed:  1. UTI symptoms Pt will come and leave urine today Force fluids RTO if symptoms worsen or do not improve  Culture pending - Urinalysis, Complete - Urine Culture - cephALEXin (KEFLEX) 500 MG capsule; Take 1 capsule (500 mg total) by mouth 2 (two) times daily.  Dispense: 14 capsule; Refill: 0    I discussed the assessment and treatment plan with the patient. The patient was provided an opportunity to ask questions and all were answered. The patient agreed with the plan and demonstrated an understanding of the instructions.   The patient was advised to call back or seek an in-person evaluation if the symptoms worsen or if the condition fails to improve as anticipated.  The above assessment and management plan was discussed with the patient. The patient verbalized understanding of and has agreed to the management plan. Patient is aware to call the clinic if symptoms persist or worsen. Patient is aware when to return to the clinic for a follow-up visit. Patient educated on when it is appropriate to go to the emergency department.   Time call ended:  9:42 AM   I provided 7 minutes of non-face-to-face time during this encounter.    Evelina Dun, FNP

## 2020-04-18 LAB — URINE CULTURE

## 2020-04-20 ENCOUNTER — Telehealth: Payer: Self-pay | Admitting: Family Medicine

## 2020-04-20 NOTE — Telephone Encounter (Signed)
Patient aware and appointment scheduled.  

## 2020-04-20 NOTE — Telephone Encounter (Signed)
°  Incoming Patient Call  04/20/2020  What symptoms do you have? Threw up this morning, still having pain in lower back and yesterday it hurt in her lower front, it woke her up during the night   How long have you been sick? Started last week in august  Have you been seen for this problem? Had televisit on 04/16/20 with Alyse Low, she is taking the medication that was prescribed she still has a few days left   If your provider decides to give you a prescription, which pharmacy would you like for it to be sent to? [facility]   Patient informed that this information will be sent to the clinical staff for review and that they should receive a follow up call.

## 2020-04-20 NOTE — Telephone Encounter (Signed)
Pt needs to be seen, as her urine culture was negative this could be something more serious. Be best to be seen face to face. May need to go to Urgent care if we do not have appointments.

## 2020-04-21 ENCOUNTER — Encounter: Payer: Self-pay | Admitting: Family Medicine

## 2020-04-21 ENCOUNTER — Other Ambulatory Visit: Payer: Self-pay

## 2020-04-21 ENCOUNTER — Ambulatory Visit (INDEPENDENT_AMBULATORY_CARE_PROVIDER_SITE_OTHER): Payer: Medicare Other

## 2020-04-21 ENCOUNTER — Ambulatory Visit (INDEPENDENT_AMBULATORY_CARE_PROVIDER_SITE_OTHER): Payer: Medicare Other | Admitting: Family Medicine

## 2020-04-21 VITALS — BP 118/74 | HR 79 | Temp 97.4°F | Ht 63.0 in | Wt 178.6 lb

## 2020-04-21 DIAGNOSIS — R310 Gross hematuria: Secondary | ICD-10-CM

## 2020-04-21 DIAGNOSIS — R112 Nausea with vomiting, unspecified: Secondary | ICD-10-CM | POA: Diagnosis not present

## 2020-04-21 DIAGNOSIS — R109 Unspecified abdominal pain: Secondary | ICD-10-CM | POA: Diagnosis not present

## 2020-04-21 NOTE — Progress Notes (Signed)
Assessment & Plan:  1-3. Right flank pain/Gross hematuria/Non-intractable vomiting with nausea - Given symptoms KUB has been ordered to assess for kidney stone.  - DG Abd 1 View   Follow up plan: Return if symptoms worsen or fail to improve.  Hendricks Limes, MSN, APRN, FNP-C [facility]  Subjective:   Patient ID: Theresa Wilson, female    DOB: September 05, 1945, 74 y.o.   MRN: [id number]  HPI: Theresa Wilson is a 74 y.o. female presenting on 04/21/2020 for Back Pain (Had tele visit 9/24 and states her right lower back still hurts )  Patient was previously treated with Keflex on 04/16/2020 due to UTI symptoms including right flank pain. She was also given Baclofen to take as needed. Today she reports her symptoms have not improved. She states the pain goes around her back to her lower abdomen. Additional symptoms include hematuria, nausea, and vomiting.    ROS: Negative unless specifically indicated above in HPI.   Relevant past medical history reviewed and updated as indicated.   Allergies and medications reviewed and updated.   Current Outpatient Medications:  .  Accu-Chek Softclix Lancets lancets, CHECK BLOOD SUGAR ONE TIME DAILY AS DIRECTED Dx E11.9, Disp: 100 each, Rfl: 3 .  acetaminophen (TYLENOL) 500 MG tablet, Take 500 mg by mouth every 6 (six) hours as needed. Reported on 07/09/2015, Disp: , Rfl:  .  aspirin 81 MG EC tablet, Take 1 tablet (81 mg total) by mouth daily. Swallow whole., Disp: 30 tablet, Rfl: 12 .  atorvastatin (LIPITOR) 40 MG tablet, TAKE 1 TABLET DAILY, Disp: 90 tablet, Rfl: 3 .  atropine 1 % ophthalmic solution, , Disp: , Rfl:  .  B-D 3CC LUER-LOK SYR 25GX1" 25G X 1" 3 ML MISC, USE TO INJECT CYANOCOBALAMIN (VITAMIN B-12), Disp: 20 each, Rfl: 0 .  baclofen (LIORESAL) 10 MG tablet, Take 1 tablet (10 mg total) by mouth 3 (three) times daily., Disp: 30 each, Rfl: 0 .  blood glucose meter kit and supplies, Dispense based on patient and insurance  preference. Check sugar once daily as directed. (FOR ICD-10 E10.9, E11.9)., Disp: 1 each, Rfl: 0 .  Blood Glucose Monitoring Suppl (BLOOD GLUCOSE MONITOR SYSTEM) w/Device KIT, Test BS BID and PRN. Dx E11.9, Disp: 1 each, Rfl: 1 .  cephALEXin (KEFLEX) 500 MG capsule, Take 1 capsule (500 mg total) by mouth 2 (two) times daily., Disp: 14 capsule, Rfl: 0 .  Cholecalciferol (VITAMIN D3) 5000 units CAPS, Take 1 capsule by mouth daily., Disp: , Rfl:  .  cyanocobalamin (,VITAMIN B-12,) 1000 MCG/ML injection, INJECT  1ML INTRAMUSCULARLY EVERY MONTH, Disp: 3 mL, Rfl: 0 .  cyclobenzaprine (FLEXERIL) 10 MG tablet, Take 1 tablet (10 mg total) by mouth 3 (three) times daily as needed for muscle spasms., Disp: 30 tablet, Rfl: 0 .  dapagliflozin propanediol (FARXIGA) 5 MG TABS tablet, Take 2 tablets (10 mg total) by mouth daily before breakfast., Disp: 90 tablet, Rfl: 2 .  Dulaglutide (TRULICITY) 3 QV/9.5GL SOPN, Inject 0.5 mLs (3 mg total) as directed once a week., Disp: 12 pen, Rfl: 1 .  esomeprazole (NEXIUM) 40 MG capsule, Take 1 capsule (40 mg total) by mouth daily., Disp: 90 capsule, Rfl: 3 .  famotidine (PEPCID) 20 MG tablet, Take 1 tablet (20 mg total) by mouth 2 (two) times daily., Disp: 180 tablet, Rfl: 3 .  glucose blood (ACCU-CHEK AVIVA PLUS) test strip, CHECK BLOOD SUGAR ONE TIME DAILY AS DIRECTED Dx E11.9, Disp: 100 strip, Rfl: 3 .  lisinopril (ZESTRIL) 5 MG tablet, Take 1 tablet (5 mg total) by mouth daily., Disp: 90 tablet, Rfl: 3 .  LOTEMAX SM 0.38 % GEL, , Disp: , Rfl:  .  meloxicam (MOBIC) 15 MG tablet, TAKE 1/2 TO 1 TABLET DAILY AS NEEDED, Disp: 90 tablet, Rfl: 3 .  moxifloxacin (VIGAMOX) 0.5 % ophthalmic solution, , Disp: , Rfl:  .  pramipexole (MIRAPEX) 0.5 MG tablet, TAKE 1 TABLET BY MOUTH AT BEDTIME., Disp: 90 tablet, Rfl: 1 .  PROLENSA 0.07 % SOLN, , Disp: , Rfl:  .  traZODone (DESYREL) 50 MG tablet, Take 1.5 tablets (75 mg total) by mouth at bedtime., Disp: 135 tablet, Rfl: 3  Allergies    Allergen Reactions  . Hydrocodone-Homatropine Nausea Only  . Rosuvastatin Other (See Comments)    Elevated LFTs  . Sulfa Antibiotics     Pt states that she is not allergic "it makes me sick to my stomach"    Objective:   BP 118/74   Pulse 79   Temp (!) 97.4 F (36.3 C) (Temporal)   Ht '5\' 3"'  (1.6 m)   Wt 178 lb 9.6 oz (81 kg)   SpO2 91%   BMI 31.64 kg/m    Physical Exam Vitals reviewed.  Constitutional:      General: She is not in acute distress.    Appearance: Normal appearance. She is obese. She is not ill-appearing, toxic-appearing or diaphoretic.  HENT:     Head: Normocephalic and atraumatic.  Eyes:     General: No scleral icterus.       Right eye: No discharge.        Left eye: No discharge.     Conjunctiva/sclera: Conjunctivae normal.  Cardiovascular:     Rate and Rhythm: Normal rate.  Pulmonary:     Effort: Pulmonary effort is normal. No respiratory distress.  Abdominal:     Tenderness: There is right CVA tenderness. There is no left CVA tenderness.  Musculoskeletal:        General: Normal range of motion.     Cervical back: Normal range of motion.  Skin:    General: Skin is warm and dry.     Capillary Refill: Capillary refill takes less than 2 seconds.  Neurological:     General: No focal deficit present.     Mental Status: She is alert and oriented to person, place, and time. Mental status is at baseline.  Psychiatric:        Mood and Affect: Mood normal.        Behavior: Behavior normal.        Thought Content: Thought content normal.        Judgment: Judgment normal.

## 2020-04-22 ENCOUNTER — Encounter: Payer: Self-pay | Admitting: Family Medicine

## 2020-04-22 ENCOUNTER — Telehealth: Payer: Self-pay | Admitting: Family Medicine

## 2020-04-22 DIAGNOSIS — B029 Zoster without complications: Secondary | ICD-10-CM

## 2020-04-22 MED ORDER — VALACYCLOVIR HCL 1 G PO TABS: 1000.0000 mg | ORAL_TABLET | Freq: Three times a day (TID) | ORAL | 0 refills | Status: AC

## 2020-04-22 NOTE — Telephone Encounter (Signed)
Pt called stating that she was seen yesterday regarding her back. Says her daughter, who is a Marine scientist, looked at her back last night and knew right away that it was shingles. Wants to know if provider can prescribe her something for shingles. Pt also said that her daughter may be able to upload a picture of pts back into her my chart.

## 2020-04-22 NOTE — Telephone Encounter (Signed)
Corresponding with patient via MyChart.

## 2020-04-23 ENCOUNTER — Encounter: Payer: Self-pay | Admitting: Family Medicine

## 2020-04-26 ENCOUNTER — Other Ambulatory Visit: Payer: Self-pay | Admitting: Family Medicine

## 2020-04-26 DIAGNOSIS — Z1231 Encounter for screening mammogram for malignant neoplasm of breast: Secondary | ICD-10-CM

## 2020-04-30 ENCOUNTER — Other Ambulatory Visit: Payer: Self-pay | Admitting: Family Medicine

## 2020-04-30 DIAGNOSIS — E1165 Type 2 diabetes mellitus with hyperglycemia: Secondary | ICD-10-CM

## 2020-05-20 ENCOUNTER — Telehealth: Payer: Self-pay

## 2020-05-20 NOTE — Telephone Encounter (Signed)
Patient is requesting a steroid injection for back pain. States pain is in between shoulder blades and she has had to come in for shots in the past. No acute appointments available for Friday.

## 2020-05-21 NOTE — Telephone Encounter (Signed)
NTBS.

## 2020-05-21 NOTE — Telephone Encounter (Signed)
Appointment scheduled.

## 2020-05-26 ENCOUNTER — Other Ambulatory Visit: Payer: Self-pay

## 2020-05-26 ENCOUNTER — Telehealth: Payer: Self-pay | Admitting: *Deleted

## 2020-05-26 ENCOUNTER — Encounter: Payer: Self-pay | Admitting: Family Medicine

## 2020-05-26 ENCOUNTER — Ambulatory Visit (INDEPENDENT_AMBULATORY_CARE_PROVIDER_SITE_OTHER): Payer: Medicare Other | Admitting: Family Medicine

## 2020-05-26 VITALS — BP 123/71 | HR 101 | Temp 97.4°F | Ht 63.0 in | Wt 179.0 lb

## 2020-05-26 DIAGNOSIS — E1165 Type 2 diabetes mellitus with hyperglycemia: Secondary | ICD-10-CM

## 2020-05-26 DIAGNOSIS — E538 Deficiency of other specified B group vitamins: Secondary | ICD-10-CM | POA: Diagnosis not present

## 2020-05-26 DIAGNOSIS — M62838 Other muscle spasm: Secondary | ICD-10-CM

## 2020-05-26 DIAGNOSIS — M546 Pain in thoracic spine: Secondary | ICD-10-CM | POA: Diagnosis not present

## 2020-05-26 DIAGNOSIS — G8929 Other chronic pain: Secondary | ICD-10-CM

## 2020-05-26 LAB — BAYER DCA HB A1C WAIVED: HB A1C (BAYER DCA - WAIVED): 8.6 % — ABNORMAL HIGH (ref <7.0)

## 2020-05-26 MED ORDER — CYANOCOBALAMIN 1000 MCG/ML IJ SOLN: INTRAMUSCULAR | 1 refills | Status: AC

## 2020-05-26 MED ORDER — DAPAGLIFLOZIN PROPANEDIOL 10 MG PO TABS: 10.0000 mg | ORAL_TABLET | Freq: Every day | ORAL | 3 refills | Status: DC

## 2020-05-26 MED ORDER — LIDOCAINE 5 % EX PTCH: 1.0000 | MEDICATED_PATCH | CUTANEOUS | 12 refills | Status: AC

## 2020-05-26 MED ORDER — CYCLOBENZAPRINE HCL 10 MG PO TABS: 10.0000 mg | ORAL_TABLET | Freq: Three times a day (TID) | ORAL | 0 refills | Status: DC | PRN

## 2020-05-26 MED ORDER — METHYLPREDNISOLONE ACETATE 40 MG/ML IJ SUSP
40.0000 mg | Freq: Once | INTRAMUSCULAR | Status: AC
  Administered 2020-05-26: 40 mg via INTRAMUSCULAR

## 2020-05-26 MED ORDER — FAMOTIDINE 20 MG PO TABS
20.0000 mg | ORAL_TABLET | Freq: Two times a day (BID) | ORAL | 3 refills | Status: DC
Start: 2020-05-26 — End: 2020-10-29

## 2020-05-26 NOTE — Telephone Encounter (Signed)
Key: [id number] - PA Case ID: [id number] - Rx #: [id number]  Drug Lidocaine 5% patches Form [facility] Medicare Part D Electronic Prior Authorization Form (2017 NCPDP) Original Claim Info [phone] Provide Exception Process Printed NoticeSubmit ICD-10 from MD documented on RxTransition N/A,Dr Submit ePA [email or web address]  Status Sent to Plan today

## 2020-05-26 NOTE — Progress Notes (Signed)
Subjective: CC: Back pain PCP: Theresa Norlander, DO DGL:OVFIE K Theresa Wilson is a 74 y.o. female presenting to clinic today for:  1.  Back pain Patient reports thoracic back pain that has been in flare for the last several days.  She points to the mid back as the area of pain and notes that it is worse with certain movements.  She has been needing afghan and states that this is likely what has flared it.  This is a chronic issue that intervally flares.  She denies any upper extremity weakness, sensory changes.  She has been using OTC analgesics with no improvement in symptoms.  Typically she requires a corticosteroid injection to relieve pain.  She is also out of her muscle relaxer.  Never used lidocaine patches but would be willing to use.  Using heat and this does seem to help some.  2.  Type 2 diabetes with hyperlipidemia and hypertension Patient is compliant with lisinopril 5 mg daily, Trulicity 3 mg subcu weekly, Farxiga 10 mg daily and Lipitor 40 mg daily.  No chest pain, shortness of breath, dizziness, sensory changes.  Blood sugars have been running 150s to 170s if she is "good".  3.  Vitamin B12 deficiency Patient needs renewal on vitamin B12 injection.  Her daughter administers this intramuscularly.  Last B12 level was in 2020 and was normal.   ROS: Per HPI  Allergies  Allergen Reactions  . Hydrocodone-Homatropine Nausea Only  . Rosuvastatin Other (See Comments)    Elevated LFTs  . Sulfa Antibiotics     Pt states that she is not allergic "it makes me sick to my stomach"   Past Medical History:  Diagnosis Date  . Allergy   . Aneurysm of splenic artery (HCC)   . Cataract   . Diabetes mellitus without complication ([place])   . Hyperlipidemia   . Hypertension   . Restless leg syndrome     Current Outpatient Medications:  .  Accu-Chek Softclix Lancets lancets, CHECK BLOOD SUGAR ONE TIME DAILY AS DIRECTED Dx E11.9, Disp: 100 each, Rfl: 3 .  acetaminophen (TYLENOL) 500 MG  tablet, Take 500 mg by mouth every 6 (six) hours as needed. Reported on 07/09/2015, Disp: , Rfl:  .  aspirin 81 MG EC tablet, Take 1 tablet (81 mg total) by mouth daily. Swallow whole., Disp: 30 tablet, Rfl: 12 .  atorvastatin (LIPITOR) 40 MG tablet, TAKE 1 TABLET DAILY, Disp: 90 tablet, Rfl: 3 .  atropine 1 % ophthalmic solution, , Disp: , Rfl:  .  B-D 3CC LUER-LOK SYR 25GX1" 25G X 1" 3 ML MISC, USE TO INJECT CYANOCOBALAMIN (VITAMIN B-12), Disp: 20 each, Rfl: 0 .  baclofen (LIORESAL) 10 MG tablet, Take 1 tablet (10 mg total) by mouth 3 (three) times daily., Disp: 30 each, Rfl: 0 .  blood glucose meter kit and supplies, Dispense based on patient and insurance preference. Check sugar once daily as directed. (FOR ICD-10 E10.9, E11.9)., Disp: 1 each, Rfl: 0 .  Blood Glucose Monitoring Suppl (BLOOD GLUCOSE MONITOR SYSTEM) w/Device KIT, Test BS BID and PRN. Dx E11.9, Disp: 1 each, Rfl: 1 .  cephALEXin (KEFLEX) 500 MG capsule, Take 1 capsule (500 mg total) by mouth 2 (two) times daily., Disp: 14 capsule, Rfl: 0 .  Cholecalciferol (VITAMIN D3) 5000 units CAPS, Take 1 capsule by mouth daily., Disp: , Rfl:  .  cyanocobalamin (,VITAMIN B-12,) 1000 MCG/ML injection, INJECT  1ML INTRAMUSCULARLY EVERY MONTH, Disp: 3 mL, Rfl: 0 .  cyclobenzaprine (FLEXERIL)  10 MG tablet, Take 1 tablet (10 mg total) by mouth 3 (three) times daily as needed for muscle spasms., Disp: 30 tablet, Rfl: 0 .  dapagliflozin propanediol (FARXIGA) 5 MG TABS tablet, Take 2 tablets (10 mg total) by mouth daily before breakfast., Disp: 90 tablet, Rfl: 2 .  esomeprazole (NEXIUM) 40 MG capsule, Take 1 capsule (40 mg total) by mouth daily., Disp: 90 capsule, Rfl: 3 .  famotidine (PEPCID) 20 MG tablet, Take 1 tablet (20 mg total) by mouth 2 (two) times daily., Disp: 180 tablet, Rfl: 3 .  glucose blood (ACCU-CHEK AVIVA PLUS) test strip, CHECK BLOOD SUGAR ONE TIME DAILY AS DIRECTED Dx E11.9, Disp: 100 strip, Rfl: 3 .  lisinopril (ZESTRIL) 5 MG  tablet, Take 1 tablet (5 mg total) by mouth daily., Disp: 90 tablet, Rfl: 3 .  LOTEMAX SM 0.38 % GEL, , Disp: , Rfl:  .  meloxicam (MOBIC) 15 MG tablet, TAKE 1/2 TO 1 TABLET DAILY AS NEEDED, Disp: 90 tablet, Rfl: 3 .  moxifloxacin (VIGAMOX) 0.5 % ophthalmic solution, , Disp: , Rfl:  .  pramipexole (MIRAPEX) 0.5 MG tablet, TAKE 1 TABLET BY MOUTH AT BEDTIME., Disp: 90 tablet, Rfl: 1 .  PROLENSA 0.07 % SOLN, , Disp: , Rfl:  .  traZODone (DESYREL) 50 MG tablet, Take 1.5 tablets (75 mg total) by mouth at bedtime., Disp: 135 tablet, Rfl: 3 .  TRULICITY 3 QQ/7.6PP SOPN, INJECT THE CONTENTS OF ONE  PEN SUBCUTANEOUSLY WEEKLY  AS DIRECTED, Disp: 6 mL, Rfl: 0 Social History   Socioeconomic History  . Marital status: Divorced    Spouse name: Not on file  . Number of children: 2  . Years of education: Not on file  . Highest education level: Not on file  Occupational History  . Occupation: Housekeeper  Tobacco Use  . Smoking status: Former Smoker    Packs/day: 0.50    Years: 45.00    Pack years: 22.50    Types: Cigarettes    Quit date: 11/11/2008    Years since quitting: 11.5  . Smokeless tobacco: Never Used  Vaping Use  . Vaping Use: Never used  Substance and Sexual Activity  . Alcohol use: No    Comment: occassional  . Drug use: No  . Sexual activity: Not Currently  Other Topics Concern  . 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    Social Determinants of Health   Financial Resource Strain:   . Difficulty of Paying Living Expenses: Not on file  Food Insecurity:   . Worried About Charity fundraiser in the Last Year: Not on file  . Ran Out of Food in the Last Year: Not on file  Transportation Needs:   . Lack of Transportation (Medical): Not on file  . Lack of Transportation (Non-Medical): Not on file  Physical Activity:   . Days of Exercise per Week: Not on file  . Minutes of Exercise per Session: Not on file    Stress:   . Feeling of Stress : Not on file  Social Connections:   . Frequency of Communication with Friends and Family: Not on file  . Frequency of Social Gatherings with Friends and Family: Not on file  . Attends Religious Services: Not on file  . Active Member of Clubs or Organizations: Not on file  . Attends Archivist Meetings: Not on file  . Marital Status: Not on file  Intimate Partner Violence:   . Fear of Current or Ex-Partner: Not on file  . Emotionally Abused: Not on file  . Physically Abused: Not on file  . Sexually Abused: Not on file   Family History  Problem Relation Age of Onset  . Heart attack Mother 92       Died age 62  . Hypertension Mother   . Hypertension Father   . Heart disease Father   . Congestive Heart Failure Father        Lived to age 47  . Cancer Sister        lung  . Hyperlipidemia Sister   . Hypertension Sister     Objective: Office vital signs reviewed. BP 123/71   Pulse (!) 101   Temp (!) 97.4 F (36.3 C) (Temporal)   Ht _0  (1.6 m)   Wt 179 lb (81.2 kg)   SpO2 97%   BMI 31.71 kg/m   Physical Examination:  General: Awake, alert, well nourished, No acute distress HEENT: Normal, sclera white, MMM Cardio: regular rate and rhythm, S1S2 heard, no murmurs appreciated Pulm: clear to auscultation bilaterally, no wheezes, rhonchi or rales; normal work of breathing on room air Extremities: warm, well perfused, No edema, cyanosis or clubbing; +2 pulses bilaterally MSK:  Thoracic spine: Has full active range of motion but does have pain with certain positions.  No midline tenderness palpation.  She has increased muscle tonicity paraspinally.  No palpable bony abnormalities  Assessment/ Plan: 74 y.o. female   1. Chronic midline thoracic back pain Suspect muscle spasm.  This is refractory to OTC analgesics.  Therefore she was given a Depo-Medrol shot.  Advised that this may increase her blood sugars.  Lidoderm patches have been  prescribed.  Muscle relaxer also prescribed.  Follow-up as needed on this issue - methylPREDNISolone acetate (DEPO-MEDROL) injection 40 mg - lidocaine (LIDODERM) 5 %; Place 1 patch onto the skin daily. Remove & Discard patch within 12 hours or as directed by MD  Dispense: 30 patch; Refill: 12  2. Muscle spasm - methylPREDNISolone acetate (DEPO-MEDROL) injection 40 mg - lidocaine (LIDODERM) 5 %; Place 1 patch onto the skin daily. Remove & Discard patch within 12 hours or as directed by MD  Dispense: 30 patch; Refill: 12  3. Uncontrolled type 2 diabetes mellitus with hyperglycemia (HCC) Sounds like blood sugars will likely still be uncontrolled.  However, hopefully they were coming down from A1c of 10.  A1c collected today.  May consider increasing Trulicity to 4.5 mg subcutaneously each week pending results.  Patient is agreeable to plan.  She will follow-up in 3 months for recheck - CMP14+EGFR - Bayer DCA Hb A1c Waived - dapagliflozin propanediol (FARXIGA) 10 MG TABS tablet; Take 1 tablet (10 mg total) by mouth daily before breakfast.  Dispense: 90 tablet; Refill: 3  4. Vitamin B12 deficiency Check B12.  B12 injections have been renewed and sent to pharmacy.  Daughter administers intramuscularly for patient - cyanocobalamin (,VITAMIN B-12,) 1000 MCG/ML injection; INJECT  1ML INTRAMUSCULARLY EVERY MONTH  Dispense: 10 mL; Refill: 1 - Vitamin B12   Orders Placed This Encounter  Procedures  . CMP14+EGFR  . Bayer DCA Hb A1c Waived   No orders of the defined types were placed in this encounter.    Theresa Norlander, DO [place] [phone]

## 2020-05-26 NOTE — Patient Instructions (Signed)

## 2020-05-27 LAB — CMP14+EGFR
ALT: 23 IU/L (ref 0–32)
AST: 17 IU/L (ref 0–40)
Albumin/Globulin Ratio: 1.8 (ref 1.2–2.2)
Albumin: 4.2 g/dL (ref 3.7–4.7)
Alkaline Phosphatase: 146 IU/L — ABNORMAL HIGH (ref 44–121)
BUN/Creatinine Ratio: 10 — ABNORMAL LOW (ref 12–28)
BUN: 11 mg/dL (ref 8–27)
Bilirubin Total: 0.3 mg/dL (ref 0.0–1.2)
CO2: 20 mmol/L (ref 20–29)
Calcium: 9.6 mg/dL (ref 8.7–10.3)
Chloride: 98 mmol/L (ref 96–106)
Creatinine, Ser: 1.1 mg/dL — ABNORMAL HIGH (ref 0.57–1.00)
GFR calc Af Amer: 58 mL/min/{1.73_m2} — ABNORMAL LOW (ref >59)
GFR calc non Af Amer: 50 mL/min/{1.73_m2} — ABNORMAL LOW (ref >59)
Globulin, Total: 2.3 g/dL (ref 1.5–4.5)
Glucose: 303 mg/dL — ABNORMAL HIGH (ref 65–99)
Potassium: 4.3 mmol/L (ref 3.5–5.2)
Sodium: 138 mmol/L (ref 134–144)
Total Protein: 6.5 g/dL (ref 6.0–8.5)

## 2020-05-27 LAB — VITAMIN B12: Vitamin B-12: 529 pg/mL (ref 232–1245)

## 2020-05-27 NOTE — Telephone Encounter (Signed)
Lidocaine Pad 5%, use as directed, is approved through 07/23/2021 under your Medicare Part D benefit.   Pharmacy aware.

## 2020-05-28 ENCOUNTER — Other Ambulatory Visit: Payer: Self-pay | Admitting: Family Medicine

## 2020-05-28 MED ORDER — TRULICITY 4.5 MG/0.5ML ~~LOC~~ SOAJ
4.5000 mg | SUBCUTANEOUS | 12 refills | Status: DC
Start: 2020-05-28 — End: 2020-05-28

## 2020-05-28 MED ORDER — TRULICITY 4.5 MG/0.5ML ~~LOC~~ SOAJ: 4.5000 mg | SUBCUTANEOUS | 12 refills | Status: DC

## 2020-05-28 NOTE — Addendum Note (Signed)
Addended by: Nigel Berthold C on: 05/28/2020 04:17 PM   Modules accepted: Orders

## 2020-06-19 ENCOUNTER — Other Ambulatory Visit: Payer: Self-pay | Admitting: Family Medicine

## 2020-06-19 DIAGNOSIS — E1165 Type 2 diabetes mellitus with hyperglycemia: Secondary | ICD-10-CM

## 2020-07-05 ENCOUNTER — Ambulatory Visit (INDEPENDENT_AMBULATORY_CARE_PROVIDER_SITE_OTHER): Payer: Medicare Other | Admitting: Family Medicine

## 2020-07-05 ENCOUNTER — Other Ambulatory Visit: Payer: Medicare Other

## 2020-07-05 ENCOUNTER — Other Ambulatory Visit: Payer: Self-pay

## 2020-07-05 VITALS — BP 114/66 | HR 72 | Temp 97.8°F | Ht 63.0 in | Wt 178.0 lb

## 2020-07-05 DIAGNOSIS — E1165 Type 2 diabetes mellitus with hyperglycemia: Secondary | ICD-10-CM | POA: Diagnosis not present

## 2020-07-05 NOTE — Patient Instructions (Signed)
See Almyra Free in next 1-2 weeks.  Ok to bring Trulicity in. Go back on Glimepiride.  Let's see if it still works for you.  Keep a blood sugar log and bring to appt with Almyra Free.  She will call [facility] with you to discontinue the trulicity.

## 2020-07-05 NOTE — Progress Notes (Signed)
Subjective: CC: DM f/u PCP: Janora Norlander, DO QQP:YPPJK K Mcgillivray is a 74 y.o. female presenting to clinic today for:  1. Type 2 Diabetes with hypertension, hyperlipidemia:  Fasting blood sugars continue to run 160s to 170s regularly, despite increased dose of Trulicity to 4.5 mg subcutaneously each week and continued Farxiga 10 mg daily.  She continues to get both Trulicity 3 mg and glimepiride in the mail, she does not know how to discontinue these medicines with her mail order.  Last eye exam: Up-to-date Last foot exam: Needs Last A1c:  Lab Results  Component Value Date   HGBA1C 8.6 (H) 05/26/2020   Nephropathy screen indicated?:  On ACE inhibitor Last flu, zoster and/or pneumovax:  Immunization History  Administered Date(s) Administered  . DT (Pediatric) 08/22/2006  . Influenza Whole 04/08/2010  . Influenza, High Dose Seasonal PF 05/11/2017  . Influenza,inj,Quad PF,6+ Mos 04/22/2014  . Influenza-Unspecified 04/23/2013, 05/02/2016, 04/06/2018  . Pneumococcal Conjugate-13 07/15/2013  . Pneumococcal Polysaccharide-23 05/31/2010, 08/20/2015  . Tdap 01/12/2018    ROS: No chest pain, shortness of breath.  No numbness or tingling in the feet.  No foot ulcerations  ROS: Per HPI  Allergies  Allergen Reactions  . Hydrocodone-Homatropine Nausea Only  . Rosuvastatin Other (See Comments)    Elevated LFTs  . Sulfa Antibiotics     Pt states that she is not allergic "it makes me sick to my stomach"   Past Medical History:  Diagnosis Date  . Allergy   . Aneurysm of splenic artery (HCC)   . Cataract   . Diabetes mellitus without complication ([place])   . Hyperlipidemia   . Hypertension   . Restless leg syndrome     Current Outpatient Medications:  .  Accu-Chek Softclix Lancets lancets, CHECK BLOOD SUGAR ONE TIME DAILY AS DIRECTED Dx E11.9, Disp: 100 each, Rfl: 3 .  acetaminophen (TYLENOL) 500 MG tablet, Take 500 mg by mouth every 6 (six) hours as needed. Reported on  07/09/2015, Disp: , Rfl:  .  aspirin 81 MG EC tablet, Take 1 tablet (81 mg total) by mouth daily. Swallow whole., Disp: 30 tablet, Rfl: 12 .  atorvastatin (LIPITOR) 40 MG tablet, TAKE 1 TABLET DAILY, Disp: 90 tablet, Rfl: 3 .  atropine 1 % ophthalmic solution, , Disp: , Rfl:  .  B-D 3CC LUER-LOK SYR 25GX1" 25G X 1" 3 ML MISC, USE TO INJECT CYANOCOBALAMIN (VITAMIN B-12), Disp: 20 each, Rfl: 0 .  blood glucose meter kit and supplies, Dispense based on patient and insurance preference. Check sugar once daily as directed. (FOR ICD-10 E10.9, E11.9)., Disp: 1 each, Rfl: 0 .  Blood Glucose Monitoring Suppl (BLOOD GLUCOSE MONITOR SYSTEM) w/Device KIT, Test BS BID and PRN. Dx E11.9, Disp: 1 each, Rfl: 1 .  Cholecalciferol (VITAMIN D3) 5000 units CAPS, Take 1 capsule by mouth daily., Disp: , Rfl:  .  cyanocobalamin (,VITAMIN B-12,) 1000 MCG/ML injection, INJECT  1ML INTRAMUSCULARLY EVERY MONTH, Disp: 10 mL, Rfl: 1 .  cyclobenzaprine (FLEXERIL) 10 MG tablet, Take 1 tablet (10 mg total) by mouth 3 (three) times daily as needed for muscle spasms., Disp: 30 tablet, Rfl: 0 .  dapagliflozin propanediol (FARXIGA) 10 MG TABS tablet, Take 1 tablet (10 mg total) by mouth daily before breakfast., Disp: 90 tablet, Rfl: 3 .  Dulaglutide (TRULICITY) 4.5 DT/2.6ZT SOPN, Inject 4.5 mg as directed once a week., Disp: 2 mL, Rfl: 12 .  esomeprazole (NEXIUM) 40 MG capsule, Take 1 capsule (40 mg total) by mouth  daily., Disp: 90 capsule, Rfl: 3 .  famotidine (PEPCID) 20 MG tablet, Take 1 tablet (20 mg total) by mouth 2 (two) times daily., Disp: 180 tablet, Rfl: 3 .  glucose blood (ACCU-CHEK AVIVA PLUS) test strip, CHECK BLOOD SUGAR ONE TIME DAILY AS DIRECTED Dx E11.9, Disp: 100 strip, Rfl: 3 .  lidocaine (LIDODERM) 5 %, Place 1 patch onto the skin daily. Remove & Discard patch within 12 hours or as directed by MD, Disp: 30 patch, Rfl: 12 .  lisinopril (ZESTRIL) 5 MG tablet, Take 1 tablet (5 mg total) by mouth daily., Disp: 90  tablet, Rfl: 3 .  meloxicam (MOBIC) 15 MG tablet, TAKE 1/2 TO 1 TABLET DAILY AS NEEDED, Disp: 90 tablet, Rfl: 3 .  pramipexole (MIRAPEX) 0.5 MG tablet, TAKE 1 TABLET BY MOUTH AT BEDTIME., Disp: 90 tablet, Rfl: 1 .  traZODone (DESYREL) 50 MG tablet, Take 1.5 tablets (75 mg total) by mouth at bedtime., Disp: 135 tablet, Rfl: 3 Social History   Socioeconomic History  . Marital status: Divorced    Spouse name: Not on file  . Number of children: 2  . Years of education: Not on file  . Highest education level: Not on file  Occupational History  . Occupation: Housekeeper  Tobacco Use  . Smoking status: Former Smoker    Packs/day: 0.50    Years: 45.00    Pack years: 22.50    Types: Cigarettes    Quit date: 11/11/2008    Years since quitting: 11.6  . Smokeless tobacco: Never Used  Vaping Use  . Vaping Use: Never used  Substance and Sexual Activity  . Alcohol use: No    Comment: occassional  . Drug use: No  . Sexual activity: Not Currently  Other Topics Concern  . 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    Social Determinants of Health   Financial Resource Strain: Not on file  Food Insecurity: Not on file  Transportation Needs: Not on file  Physical Activity: Not on file  Stress: Not on file  Social Connections: Not on file  Intimate Partner Violence: Not on file   Family History  Problem Relation Age of Onset  . Heart attack Mother 13       Died age 73  . Hypertension Mother   . Hypertension Father   . Heart disease Father   . Congestive Heart Failure Father        Lived to age 65  . Cancer Sister        lung  . Hyperlipidemia Sister   . Hypertension Sister     Objective: Office vital signs reviewed. BP 114/66   Pulse 72   Temp 97.8 F (36.6 C) (Temporal)   Ht 5' 3" (1.6 m)   Wt 178 lb (80.7 kg)   SpO2 95%   BMI 31.53 kg/m   Physical Examination:  General: Awake, alert, well nourished, No  acute distress Cardio: regular rate and rhythm, S1S2 heard, no murmurs appreciated Pulm: clear to auscultation bilaterally, no wheezes, rhonchi or rales; normal work of breathing on room air Neuro: see DM foot  Diabetic Foot Exam - Simple   Simple Foot Form Diabetic Foot exam was performed with the following findings: Yes 07/05/2020  9:05 AM  Visual Inspection No deformities, no ulcerations, no other skin breakdown bilaterally: Yes Sensation Testing Intact to touch and monofilament testing bilaterally: Yes Pulse Check Posterior Tibialis and  Dorsalis pulse intact bilaterally: Yes Comments      Assessment/ Plan: 74 y.o. female   Uncontrolled type 2 diabetes mellitus with hyperglycemia (HCC)  Blood sugars remain uncontrolled.  I advised her to go back on her glimepiride.  She was to start with 1 tablet daily in addition to the [place] and Trulicity weekly.  She will follow-up with Almyra Free in the next 1 to 2 weeks for review of blood sugars.  Her next step may be insulin given ongoing uncontrolled diabetes despite max therapeutic doses.  She is intolerant to Metformin.  This is limiting.  No orders of the defined types were placed in this encounter.  No orders of the defined types were placed in this encounter.    Janora Norlander, DO [place] [phone]

## 2020-07-19 ENCOUNTER — Ambulatory Visit: Payer: Medicare Other | Admitting: Pharmacist

## 2020-07-22 ENCOUNTER — Ambulatory Visit (INDEPENDENT_AMBULATORY_CARE_PROVIDER_SITE_OTHER): Payer: Medicare Other | Admitting: Pharmacist

## 2020-07-22 ENCOUNTER — Other Ambulatory Visit: Payer: Self-pay

## 2020-07-22 DIAGNOSIS — E119 Type 2 diabetes mellitus without complications: Secondary | ICD-10-CM | POA: Diagnosis not present

## 2020-07-22 MED ORDER — GLIMEPIRIDE 4 MG PO TABS
4.0000 mg | ORAL_TABLET | Freq: Every day | ORAL | 3 refills | Status: DC
Start: 2020-07-22 — End: 2020-10-29

## 2020-07-22 MED ORDER — TRULICITY 4.5 MG/0.5ML ~~LOC~~ SOAJ: 4.5000 mg | SUBCUTANEOUS | 12 refills | Status: DC

## 2020-07-22 NOTE — Progress Notes (Signed)
    07/22/2020 Name: Theresa Wilson MRN: [id number] DOB: [birth year]   S:  74 yoF Presents for diabetes evaluation, education, and management Patient was referred and last seen by Primary Care Provider on 07/09/20. Insurance coverage/medication affordability: [facility]  Patient reports adherence with medications.  Current diabetes medications include: farxiga, trulicity , glimepiride  Current hypertension medications include: lisinopril Goal 130/80  Current hyperlipidemia medications include: atorvastatin   Patient denies hypoglycemic events.   Patient reported dietary habits: Eats 3 meals/day  Discussed meal planning options and Plate method for healthy eating . Avoid sugary drinks and desserts . Incorporate balanced protein, non starchy veggies, 1 serving of carbohydrate with each meal . Increase water intake . Increase physical activity as able   O:  Lab Results  Component Value Date   HGBA1C 8.6 (H) 05/26/2020      Lipid Panel     Component Value Date/Time   CHOL 164 12/06/2017 1246   CHOL 178 11/11/2012 1647   TRIG 176 (H) 12/06/2017 1246   TRIG 162 (H) 08/02/2016 1038   TRIG 252 (H) 11/11/2012 1647   HDL 45 12/06/2017 1246   HDL 47 08/02/2016 1038   HDL 51 11/11/2012 1647   CHOLHDL 3.6 12/06/2017 1246   LDLCALC 84 12/06/2017 1246   LDLCALC 221 (H) 01/21/2014 1626   LDLCALC 77 11/11/2012 1647     Home fasting blood sugars:  12/14  227 12/15  165 12/16  161 12/17  124 12/18  136 12/19  157            118 12/20  163 12/21  118 12/22  127 12/23  158 12/24  157 12/25  170 12/26  169 12/27  160 12/28  136 12/29  146 12/30  158  2 hour post-meal/random blood sugars: n/a.    Clinical Atherosclerotic Cardiovascular Disease (ASCVD): No   The 10-year ASCVD risk score Denman George DC Jr., et al., 2013) is: 22.3%   Values used to calculate the score:     Age: 74 years     Sex: Female     Is Non-Hispanic African American: No     Diabetic: Yes      Tobacco smoker: No     Systolic Blood Pressure: 114 mmHg     Is BP treated: Yes     HDL Cholesterol: 45 mg/dL     Total Cholesterol: 164 mg/dL    A/P:  Diabetes I0XB currently uncontrolled, but improved from last visit.  Patient is adherent with medication.   -Continue Trulicity 4.5mg  sq weekly  -Continue Farxiga 10mg  daily-GFR 50  -Restart glimepiride (patient has stock at home) to see if we can provide additional A1c lowering  Can't tolerate metformin  May have to add insulin   -Extensively discussed pathophysiology of diabetes, recommended lifestyle interventions, dietary effects on blood sugar control  -Counseled on s/sx of and management of hypoglycemia  -Next A1C anticipated 10/04/20.  Written patient instructions provided.  Total time in face to face counseling 30 minutes.   Follow up PCP Clinic Visit ON 10/04/20.    10/06/20, PharmD, BCPS Clinical Pharmacist, [facility]  II Phone [phone]

## 2020-10-04 ENCOUNTER — Ambulatory Visit: Payer: Medicare Other | Admitting: Family Medicine

## 2020-10-23 ENCOUNTER — Other Ambulatory Visit: Payer: Self-pay | Admitting: Family Medicine

## 2020-10-23 DIAGNOSIS — I152 Hypertension secondary to endocrine disorders: Secondary | ICD-10-CM

## 2020-10-23 DIAGNOSIS — E1159 Type 2 diabetes mellitus with other circulatory complications: Secondary | ICD-10-CM

## 2020-10-23 DIAGNOSIS — E1169 Type 2 diabetes mellitus with other specified complication: Secondary | ICD-10-CM

## 2020-10-23 DIAGNOSIS — F5101 Primary insomnia: Secondary | ICD-10-CM

## 2020-10-23 DIAGNOSIS — E785 Hyperlipidemia, unspecified: Secondary | ICD-10-CM

## 2020-10-29 ENCOUNTER — Encounter: Payer: Self-pay | Admitting: Family Medicine

## 2020-10-29 ENCOUNTER — Ambulatory Visit (INDEPENDENT_AMBULATORY_CARE_PROVIDER_SITE_OTHER): Payer: Medicare Other | Admitting: Family Medicine

## 2020-10-29 ENCOUNTER — Other Ambulatory Visit: Payer: Self-pay

## 2020-10-29 VITALS — BP 137/80 | HR 72 | Temp 98.0°F | Ht 63.0 in | Wt 174.0 lb

## 2020-10-29 DIAGNOSIS — M62838 Other muscle spasm: Secondary | ICD-10-CM

## 2020-10-29 DIAGNOSIS — Z780 Asymptomatic menopausal state: Secondary | ICD-10-CM | POA: Diagnosis not present

## 2020-10-29 DIAGNOSIS — I152 Hypertension secondary to endocrine disorders: Secondary | ICD-10-CM

## 2020-10-29 DIAGNOSIS — E1159 Type 2 diabetes mellitus with other circulatory complications: Secondary | ICD-10-CM | POA: Diagnosis not present

## 2020-10-29 DIAGNOSIS — F5101 Primary insomnia: Secondary | ICD-10-CM

## 2020-10-29 DIAGNOSIS — E1165 Type 2 diabetes mellitus with hyperglycemia: Secondary | ICD-10-CM

## 2020-10-29 DIAGNOSIS — E1169 Type 2 diabetes mellitus with other specified complication: Secondary | ICD-10-CM

## 2020-10-29 DIAGNOSIS — G2581 Restless legs syndrome: Secondary | ICD-10-CM

## 2020-10-29 DIAGNOSIS — E785 Hyperlipidemia, unspecified: Secondary | ICD-10-CM

## 2020-10-29 LAB — BAYER DCA HB A1C WAIVED: HB A1C (BAYER DCA - WAIVED): 8.3 % — ABNORMAL HIGH (ref <7.0)

## 2020-10-29 MED ORDER — CYCLOBENZAPRINE HCL 10 MG PO TABS: 10.0000 mg | ORAL_TABLET | Freq: Three times a day (TID) | ORAL | 0 refills | Status: DC | PRN

## 2020-10-29 MED ORDER — TRULICITY 4.5 MG/0.5ML ~~LOC~~ SOAJ: 4.5000 mg | SUBCUTANEOUS | 12 refills | Status: AC

## 2020-10-29 MED ORDER — LISINOPRIL 5 MG PO TABS: 5.0000 mg | ORAL_TABLET | Freq: Every day | ORAL | 3 refills | Status: AC

## 2020-10-29 MED ORDER — ATORVASTATIN CALCIUM 40 MG PO TABS: ORAL_TABLET | ORAL | 3 refills | Status: AC

## 2020-10-29 MED ORDER — MELOXICAM 15 MG PO TABS: ORAL_TABLET | ORAL | 3 refills | Status: AC

## 2020-10-29 MED ORDER — DAPAGLIFLOZIN PROPANEDIOL 10 MG PO TABS: 10.0000 mg | ORAL_TABLET | Freq: Every day | ORAL | 3 refills | Status: AC

## 2020-10-29 MED ORDER — FLUCONAZOLE 150 MG PO TABS: 150.0000 mg | ORAL_TABLET | Freq: Once | ORAL | 0 refills | Status: AC

## 2020-10-29 MED ORDER — BLOOD GLUCOSE METER KIT: PACK | 0 refills | Status: AC

## 2020-10-29 MED ORDER — ESOMEPRAZOLE MAGNESIUM 40 MG PO CPDR: 40.0000 mg | DELAYED_RELEASE_CAPSULE | Freq: Every day | ORAL | 3 refills | Status: AC

## 2020-10-29 MED ORDER — GLUCOSE BLOOD VI STRP: ORAL_STRIP | 3 refills | Status: AC

## 2020-10-29 MED ORDER — PRAMIPEXOLE DIHYDROCHLORIDE 0.5 MG PO TABS: 0.5000 mg | ORAL_TABLET | Freq: Every day | ORAL | 1 refills | Status: AC

## 2020-10-29 MED ORDER — TRAZODONE HCL 100 MG PO TABS: 100.0000 mg | ORAL_TABLET | Freq: Every day | ORAL | 3 refills | Status: AC

## 2020-10-29 MED ORDER — GLIMEPIRIDE 4 MG PO TABS: 4.0000 mg | ORAL_TABLET | Freq: Every day | ORAL | 3 refills | Status: DC

## 2020-10-29 MED ORDER — FAMOTIDINE 20 MG PO TABS: 20.0000 mg | ORAL_TABLET | Freq: Two times a day (BID) | ORAL | 3 refills | Status: AC

## 2020-10-29 NOTE — Progress Notes (Addendum)
Subjective: CC: DM PCP: Janora Norlander, DO CVE:LFYBO K Fair is a 75 y.o. female presenting to clinic today for:  1. Type 2 Diabetes with hypertension, hyperlipidemia:  Patient reports compliance with her medications.  She is on Trulicity 4.5 every 7 days, Farxiga 10 mg daily and is taking 8 mg of glimepiride daily.  She reports that blood sugars run anywhere from between 120s and 150s.  Does not report any hypoglycemic episodes.  She is compliant with her statin and her blood pressure medication.  She does report some mild vaginitis and she has been using Monistat OTC for this.  Denies any internal symptoms.  Last eye exam: Up-to-date Last foot exam: Up-to-date Last A1c:  Lab Results  Component Value Date   HGBA1C 8.6 (H) 05/26/2020   Nephropathy screen indicated?:  On ACE inhibitor Last flu, zoster and/or pneumovax:  Immunization History  Administered Date(s) Administered  . DT (Pediatric) 08/22/2006  . Influenza Whole 04/08/2010  . Influenza, High Dose Seasonal PF 05/11/2017  . Influenza,inj,Quad PF,6+ Mos 04/22/2014  . Influenza-Unspecified 04/23/2013, 05/02/2016, 04/06/2018  . Pneumococcal Conjugate-13 07/15/2013  . Pneumococcal Polysaccharide-23 05/31/2010, 08/20/2015  . Tdap 01/12/2018    ROS: Per HPI  Allergies  Allergen Reactions  . Hydrocodone-Homatropine Nausea Only  . Rosuvastatin Other (See Comments)    Elevated LFTs  . Sulfa Antibiotics     Pt states that she is not allergic "it makes me sick to my stomach"   Past Medical History:  Diagnosis Date  . Allergy   . Aneurysm of splenic artery (HCC)   . Cataract   . Diabetes mellitus without complication ([place])   . Hyperlipidemia   . Hypertension   . Restless leg syndrome     Current Outpatient Medications:  .  Accu-Chek Softclix Lancets lancets, CHECK BLOOD SUGAR ONE TIME DAILY AS DIRECTED Dx E11.9, Disp: 100 each, Rfl: 3 .  acetaminophen (TYLENOL) 500 MG tablet, Take 500 mg by mouth every 6 (six)  hours as needed. Reported on 07/09/2015, Disp: , Rfl:  .  aspirin 81 MG EC tablet, Take 1 tablet (81 mg total) by mouth daily. Swallow whole., Disp: 30 tablet, Rfl: 12 .  atorvastatin (LIPITOR) 40 MG tablet, TAKE 1 TABLET DAILY, Disp: 90 tablet, Rfl: 3 .  atropine 1 % ophthalmic solution, , Disp: , Rfl:  .  B-D 3CC LUER-LOK SYR 25GX1" 25G X 1" 3 ML MISC, USE TO INJECT CYANOCOBALAMIN (VITAMIN B-12), Disp: 20 each, Rfl: 0 .  blood glucose meter kit and supplies, Dispense based on patient and insurance preference. Check sugar once daily as directed. (FOR ICD-10 E10.9, E11.9)., Disp: 1 each, Rfl: 0 .  Blood Glucose Monitoring Suppl (BLOOD GLUCOSE MONITOR SYSTEM) w/Device KIT, Test BS BID and PRN. Dx E11.9, Disp: 1 each, Rfl: 1 .  Cholecalciferol (VITAMIN D3) 5000 units CAPS, Take 1 capsule by mouth daily., Disp: , Rfl:  .  cyanocobalamin (,VITAMIN B-12,) 1000 MCG/ML injection, INJECT  1ML INTRAMUSCULARLY EVERY MONTH, Disp: 10 mL, Rfl: 1 .  cyclobenzaprine (FLEXERIL) 10 MG tablet, Take 1 tablet (10 mg total) by mouth 3 (three) times daily as needed for muscle spasms., Disp: 30 tablet, Rfl: 0 .  dapagliflozin propanediol (FARXIGA) 10 MG TABS tablet, Take 1 tablet (10 mg total) by mouth daily before breakfast., Disp: 90 tablet, Rfl: 3 .  Dulaglutide (TRULICITY) 4.5 FB/5.1WC SOPN, Inject 4.5 mg as directed once a week. Please fill 3 month supply, Disp: 6 mL, Rfl: 12 .  esomeprazole (NEXIUM) 40  MG capsule, Take 1 capsule (40 mg total) by mouth daily., Disp: 90 capsule, Rfl: 3 .  famotidine (PEPCID) 20 MG tablet, Take 1 tablet (20 mg total) by mouth 2 (two) times daily., Disp: 180 tablet, Rfl: 3 .  glimepiride (AMARYL) 4 MG tablet, Take 1-2 tablets (4-8 mg total) by mouth daily before breakfast., Disp: 180 tablet, Rfl: 3 .  glucose blood (ACCU-CHEK AVIVA PLUS) test strip, CHECK BLOOD SUGAR ONE TIME DAILY AS DIRECTED Dx E11.9, Disp: 100 strip, Rfl: 3 .  lidocaine (LIDODERM) 5 %, Place 1 patch onto the skin  daily. Remove & Discard patch within 12 hours or as directed by MD, Disp: 30 patch, Rfl: 12 .  lisinopril (ZESTRIL) 5 MG tablet, Take 1 tablet (5 mg total) by mouth daily., Disp: 90 tablet, Rfl: 3 .  meloxicam (MOBIC) 15 MG tablet, TAKE 1/2 TO 1 TABLET DAILY AS NEEDED, Disp: 90 tablet, Rfl: 3 .  pramipexole (MIRAPEX) 0.5 MG tablet, TAKE 1 TABLET BY MOUTH AT BEDTIME., Disp: 90 tablet, Rfl: 1 .  traZODone (DESYREL) 50 MG tablet, Take 1.5 tablets (75 mg total) by mouth at bedtime., Disp: 135 tablet, Rfl: 3 Social History   Socioeconomic History  . Marital status: Divorced    Spouse name: Not on file  . Number of children: 2  . Years of education: Not on file  . Highest education level: Not on file  Occupational History  . Occupation: Housekeeper  Tobacco Use  . Smoking status: Former Smoker    Packs/day: 0.50    Years: 45.00    Pack years: 22.50    Types: Cigarettes    Quit date: 11/11/2008    Years since quitting: 11.9  . Smokeless tobacco: Never Used  Vaping Use  . Vaping Use: Never used  Substance and Sexual Activity  . Alcohol use: No    Comment: occassional  . Drug use: No  . Sexual activity: Not Currently  Other Topics Concern  . 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    Social Determinants of Health   Financial Resource Strain: Not on file  Food Insecurity: Not on file  Transportation Needs: Not on file  Physical Activity: Not on file  Stress: Not on file  Social Connections: Not on file  Intimate Partner Violence: Not on file   Family History  Problem Relation Age of Onset  . Heart attack Mother 41       Died age 84  . Hypertension Mother   . Hypertension Father   . Heart disease Father   . Congestive Heart Failure Father        Lived to age 19  . Cancer Sister        lung  . Hyperlipidemia Sister   . Hypertension Sister     Objective: Office vital signs reviewed. BP 137/80   Pulse  72   Temp 98 F (36.7 C) (Temporal)   Ht 5' 3" (1.6 m)   Wt 174 lb (78.9 kg)   SpO2 98%   BMI 30.82 kg/m   Physical Examination:  General: Awake, alert, well nourished, No acute distress HEENT: Normal, sclera white, MMM Cardio: regular rate and rhythm, S1S2 heard, no murmurs appreciated Pulm: clear to auscultation bilaterally, no wheezes, rhonchi or rales; normal work of breathing on room air Extremities: warm, well perfused, No edema, cyanosis or clubbing; +2 pulses bilaterally Psych: Mood stable.  Affect somewhat flat  but patient is pleasant interactive Depression screen [facility] 2/9 10/29/2020 05/26/2020 01/07/2020 05/23/2019 10/03/2018  Decreased Interest 0 0 0 0 0  Down, Depressed, Hopeless 0 0 0 0 0  PHQ - 2 Score 0 0 0 0 0  Altered sleeping - 0 0 0 -  Tired, decreased energy - 0 0 0 -  Change in appetite - 0 0 0 -  Feeling bad or failure about yourself  - 0 0 0 -  Trouble concentrating - 0 0 0 -  Moving slowly or fidgety/restless - 0 0 0 -  Suicidal thoughts - 0 0 0 -  PHQ-9 Score - 0 0 0 -  Difficult doing work/chores - - - - -     Assessment/ Plan: 75 y.o. female   Uncontrolled type 2 diabetes mellitus with hyperglycemia ([place]) - Plan: Bayer DCA Hb A1c Waived, Basic Metabolic Panel, blood glucose meter kit and supplies, dapagliflozin propanediol (FARXIGA) 10 MG TABS tablet, glucose blood test strip  Hyperlipidemia associated with type 2 diabetes mellitus (HCC) - Plan: atorvastatin (LIPITOR) 40 MG tablet  Hypertension associated with diabetes ([place]) - Plan: lisinopril (ZESTRIL) 5 MG tablet  Asymptomatic postmenopausal estrogen deficiency - Plan: DG [facility] DEXA  Muscle spasm - Plan: cyclobenzaprine (FLEXERIL) 10 MG tablet  Restless legs syndrome - Plan: pramipexole (MIRAPEX) 0.5 MG tablet  Primary insomnia - Plan: traZODone (DESYREL) 100 MG tablet  A1c persistently elevated to 8.3.  Continue current regimen for now.  Referral to endocrinology placed given multiple drug  therapies without any significant improvement in sugar.  Continue statin.  This has been renewed  Continue lisinopril.  Blood pressure well controlled  Increase dose of trazodone to 100 mg a new pill has been sent.  She will continue Mirapex and Flexeril as directed  Plan for DEXA scan at next visit  No orders of the defined types were placed in this encounter.  No orders of the defined types were placed in this encounter.    Janora Norlander, DO [place] [phone]

## 2020-10-29 NOTE — Addendum Note (Signed)
Addended by: Janora Norlander on: 10/29/2020 03:13 PM   Modules accepted: Orders

## 2020-10-30 LAB — BASIC METABOLIC PANEL
BUN/Creatinine Ratio: 10 — ABNORMAL LOW (ref 12–28)
BUN: 9 mg/dL (ref 8–27)
CO2: 17 mmol/L — ABNORMAL LOW (ref 20–29)
Calcium: 9.6 mg/dL (ref 8.7–10.3)
Chloride: 101 mmol/L (ref 96–106)
Creatinine, Ser: 0.86 mg/dL (ref 0.57–1.00)
Glucose: 88 mg/dL (ref 65–99)
Potassium: 3.9 mmol/L (ref 3.5–5.2)
Sodium: 140 mmol/L (ref 134–144)
eGFR: 72 mL/min/{1.73_m2} (ref >59)

## 2020-11-02 ENCOUNTER — Ambulatory Visit (INDEPENDENT_AMBULATORY_CARE_PROVIDER_SITE_OTHER): Payer: Medicare Other

## 2020-11-02 VITALS — Ht 63.0 in | Wt 174.0 lb

## 2020-11-02 DIAGNOSIS — Z0000 Encounter for general adult medical examination without abnormal findings: Secondary | ICD-10-CM

## 2020-11-02 NOTE — Progress Notes (Signed)
Subjective:   Theresa Wilson is a 75 y.o. female who presents for Medicare Annual (Subsequent) preventive examination.  Virtual Visit via Telephone Note  I connected with  [name] on 11/02/20 at  8:15 AM EDT by telephone and verified that I am speaking with the correct person using two identifiers.  Location: Patient: Home Provider: [facility] Persons participating in the virtual visit: patient/Nurse Health Advisor   I discussed the limitations, risks, security and privacy concerns of performing an evaluation and management service by telephone and the availability of in person appointments. The patient expressed understanding and agreed to proceed.  Interactive audio and video telecommunications were attempted between this nurse and patient, however failed, due to patient having technical difficulties OR patient did not have access to video capability.  We continued and completed visit with audio only.  Some vital signs may be absent or patient reported.   Nilson Tabora E Chivonne Rascon, LPN   Review of Systems     Cardiac Risk Factors include: advanced age (>75mn, >>2women);diabetes mellitus;dyslipidemia;obesity (BMI >30kg/m2);sedentary lifestyle;hypertension;microalbuminuria     Objective:    Today's Vitals   11/02/20 0837  Weight: 174 lb (78.9 kg)  Height: '5\' 3"'  (1.6 m)   Body mass index is 30.82 kg/m.  Advanced Directives 11/02/2020 02/06/2018 09/20/2016 08/20/2015 07/31/2014 04/14/2014  Does Patient Have a Medical Advance Directive? No No No No No No  Would patient like information on creating a medical advance directive? Yes (MAU/Ambulatory/Procedural Areas - Information given) Yes (MAU/Ambulatory/Procedural Areas - Information given) No - Patient declined No - patient declined information Yes - Educational materials given No - patient declined information    Current Medications (verified) Outpatient Encounter Medications as of 11/02/2020  Medication Sig  . Accu-Chek Softclix Lancets  lancets CHECK BLOOD SUGAR ONE TIME DAILY AS DIRECTED Dx E11.9  . aspirin 81 MG EC tablet Take 1 tablet (81 mg total) by mouth daily. Swallow whole.  .Marland Kitchenatorvastatin (LIPITOR) 40 MG tablet TAKE 1 TABLET DAILY  . B-D 3CC LUER-LOK SYR 25GX1" 25G X 1" 3 ML MISC USE TO INJECT CYANOCOBALAMIN (VITAMIN B-12)  . blood glucose meter kit and supplies Dispense based on patient and insurance preference. Check sugar once daily as directed. (FOR ICD-10 E10.9, E11.9).  .Marland KitchenBlood Glucose Monitoring Suppl (ACCU-CHEK GUIDE) w/Device KIT   . Cholecalciferol (VITAMIN D3) 5000 units CAPS Take 1 capsule by mouth daily.  . cyanocobalamin (,VITAMIN B-12,) 1000 MCG/ML injection INJECT  1ML INTRAMUSCULARLY EVERY MONTH  . cyclobenzaprine (FLEXERIL) 10 MG tablet Take 1 tablet (10 mg total) by mouth 3 (three) times daily as needed for muscle spasms.  . dapagliflozin propanediol (FARXIGA) 10 MG TABS tablet Take 1 tablet (10 mg total) by mouth daily before breakfast.  . Dulaglutide (TRULICITY) 4.5 MOH/6.0VPSOPN Inject 4.5 mg as directed once a week. Please fill 3 month supply  . esomeprazole (NEXIUM) 40 MG capsule Take 1 capsule (40 mg total) by mouth daily.  . famotidine (PEPCID) 20 MG tablet Take 1 tablet (20 mg total) by mouth 2 (two) times daily.  . fluconazole (DIFLUCAN) 150 MG tablet Take by mouth.  .Marland Kitchenglimepiride (AMARYL) 4 MG tablet Take 1-2 tablets (4-8 mg total) by mouth daily before breakfast.  . glucose blood test strip Check BG daily. E11.9  . lidocaine (LIDODERM) 5 % Place 1 patch onto the skin daily. Remove & Discard patch within 12 hours or as directed by MD  . lisinopril (ZESTRIL) 5 MG tablet Take 1 tablet (5 mg  total) by mouth daily.  . meloxicam (MOBIC) 15 MG tablet TAKE 1/2 TO 1 TABLET DAILY AS NEEDED  . pramipexole (MIRAPEX) 0.5 MG tablet Take 1 tablet (0.5 mg total) by mouth at bedtime.  . traZODone (DESYREL) 100 MG tablet Take 1 tablet (100 mg total) by mouth at bedtime. Please note DOSE/ PILL change   No  facility-administered encounter medications on file as of 11/02/2020.    Allergies (verified) Hydrocodone-homatropine, Rosuvastatin, and Sulfa antibiotics   History: Past Medical History:  Diagnosis Date  . Allergy   . Aneurysm of splenic artery (HCC)   . Cataract   . Diabetes mellitus without complication ([place])   . Hyperlipidemia   . Hypertension   . Restless leg syndrome    Past Surgical History:  Procedure Laterality Date  . OVARIAN CYST REMOVAL     Family History  Problem Relation Age of Onset  . Heart attack Mother 30       Died age 5  . Hypertension Mother   . Hypertension Father   . Heart disease Father   . Congestive Heart Failure Father        Lived to age 73  . Cancer Sister        lung  . Hyperlipidemia Sister   . Hypertension Sister    Social History   Socioeconomic History  . Marital status: Divorced    Spouse name: Not on file  . Number of children: 2  . Years of education: Not on file  . Highest education level: Not on file  Occupational History  . Occupation: retired    Comment: keeps grandchildren full time  Tobacco Use  . Smoking status: Former Smoker    Packs/day: 0.50    Years: 45.00    Pack years: 22.50    Types: Cigarettes    Quit date: 11/11/2008    Years since quitting: 11.9  . Smokeless tobacco: Never Used  Vaping Use  . Vaping Use: Never used  Substance and Sexual Activity  . Alcohol use: No    Comment: occassional  . Drug use: No  . Sexual activity: Not Currently  Other Topics Concern  . 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 Four grandchildren - she babysits them four days per week while her daughter works at hospital.   Social Determinants of Radio broadcast assistant Strain: [place]   . Difficulty of Paying Living Expenses: Not hard at all  Food Insecurity: No Food Insecurity  . Worried About Charity fundraiser in the Last Year: Never true  . Ran Out of Food in the  Last Year: Never true  Transportation Needs: No Transportation Needs  . Lack of Transportation (Medical): No  . Lack of Transportation (Non-Medical): No  Physical Activity: Insufficiently Active  . Days of Exercise per Week: 7 days  . Minutes of Exercise per Session: 20 min  Stress: No Stress Concern Present  . Feeling of Stress : Only a little  Social Connections: Socially Isolated  . Frequency of Communication with Friends and Family: More than three times a week  . Frequency of Social Gatherings with Friends and Family: More than three times a week  . Attends Religious Services: Never  . Active Member of Clubs or Organizations: No  . Attends Archivist Meetings: Never  . Marital Status: Divorced    Tobacco Counseling Counseling given: Not Answered   Clinical Intake:  Pre-visit preparation completed: Yes  Pain : No/denies pain     BMI - recorded: 30.82 Nutritional Status: BMI > 30  Obese Nutritional Risks: None Diabetes: Yes CBG done?: No (217 this morning at home per patient) Did pt. bring in CBG monitor from home?: No  How often do you need to have someone help you when you read instructions, pamphlets, or other written materials from your doctor or pharmacy?: 1 - Never  Nutrition Risk Assessment:  Has the patient had any N/V/D within the last 2 months?  No  Does the patient have any non-healing wounds?  No  Has the patient had any unintentional weight loss or weight gain?  No   Diabetes:  Is the patient diabetic?  Yes  If diabetic, was a CBG obtained today?  No  Did the patient bring in their glucometer from home?  No  How often do you monitor your CBG's? Once daily, fasting.   Financial Strains and Diabetes Management:  Are you having any financial strains with the device, your supplies or your medication? No .  Does the patient want to be seen by Chronic Care Management for management of their diabetes?  No  Would the patient like to be  referred to a Nutritionist or for Diabetic Management?  No   Diabetic Exams:  Diabetic Eye Exam: Completed 12/17/2019.  Diabetic Foot Exam: Completed 07/05/2020. Pt has been advised about the importance in completing this exam. Pt is scheduled for diabetic foot exam on next OV.    Interpreter Needed?: No  Information entered by :: Al Gagen, LPN   Activities of Daily Living In your present state of health, do you have any difficulty performing the following activities: 11/02/2020  Hearing? N  Vision? N  Difficulty concentrating or making decisions? N  Walking or climbing stairs? N  Dressing or bathing? N  Doing errands, shopping? N  Preparing Food and eating ? N  Using the Toilet? N  In the past six months, have you accidently leaked urine? N  Do you have problems with loss of bowel control? N  Managing your Medications? N  Managing your Finances? N  Housekeeping or managing your Housekeeping? N  Some recent data might be hidden    Patient Care Team: Janora Norlander, DO as PCP - General (Family Medicine) Lavera Guise, [facility] (Pharmacist)  Indicate any recent Medical Services you may have received from other than [facility] providers in the past year (date may be approximate).     Assessment:   This is a routine wellness examination for [place].  Hearing/Vision screen  Hearing Screening   '125Hz'  '250Hz'  '500Hz'  '1000Hz'  '2000Hz'  '3000Hz'  '4000Hz'  '6000Hz'  '8000Hz'   Right ear:           Left ear:           Comments: She feels that she has mild hearing loss - cannot hear low sounds - she had hearing checked recently - hearing aids not indicated.  Vision Screening Comments: Wears eyeglasses - [facility] in [place]  Dietary issues and exercise activities discussed: Current Exercise Habits: Home exercise routine, Type of exercise: walking, Time (Minutes): 20, Frequency (Times/Week): 7, Weekly Exercise (Minutes/Week): 140, Intensity: Mild, Exercise limited by: None  identified  Goals    .  HEMOGLOBIN A1C < 7 (pt-stated)      Patient plans to monitor her carbohydrate intake more closely to improve Hbg A1c to less than 7%.  Goals Addressed  This Visit's Progress   .  HEMOGLOBIN A1C < 7 (pt-stated)        Patient plans to monitor her carbohydrate intake more closely to improve Hbg A1c to less than 7%.               Depression Screen PHQ 2/9 Scores 11/02/2020 10/29/2020 05/26/2020 01/07/2020 05/23/2019 10/03/2018 02/06/2018  PHQ - 2 Score 0 0 0 0 0 0 0  PHQ- 9 Score - - 0 0 0 - -    Fall Risk Fall Risk  11/02/2020 10/29/2020 05/26/2020 01/07/2020 05/23/2019  Falls in the past year? 0 0 0 1 0  Number falls in past yr: 0 - - 0 -  Injury with Fall? 0 - - 0 -  Risk for fall due to : No Fall Risks - - History of fall(s) -  Follow up Falls prevention discussed - - Falls prevention discussed -    FALL RISK PREVENTION PERTAINING TO THE HOME:  Any stairs in or around the home? No  If so, are there any without handrails? No  Home free of loose throw rugs in walkways, pet beds, electrical cords, etc? Yes  Adequate lighting in your home to reduce risk of falls? Yes   ASSISTIVE DEVICES UTILIZED TO PREVENT FALLS:  Life alert? No  Use of a cane, walker or w/c? No  Grab bars in the bathroom? No  Shower chair or bench in shower? No  Elevated toilet seat or a handicapped toilet? No   TIMED UP AND GO:  Was the test performed? No . telephonic visit.  Cognitive Function: MMSE - Mini Mental State Exam 02/06/2018 09/20/2016 08/20/2015  Orientation to time '5 5 5  ' Orientation to Place '5 5 5  ' Registration '3 3 3  ' Attention/ Calculation '5 5 4  ' Recall '3 3 3  ' Language- name 2 objects '2 2 2  ' Language- repeat '1 1 1  ' Language- follow 3 step command '3 3 3  ' Language- read & follow direction '1 1 1  ' Write a sentence '1 1 1  ' Copy design '1 1 1  ' Total score '30 30 29     ' 6CIT Screen 11/02/2020  What Year? 0 points  What month? 0 points  What time? 0  points  Count back from 20 0 points  Months in reverse 0 points  Repeat phrase 0 points  Total Score 0    Immunizations Immunization History  Administered Date(s) Administered  . DT (Pediatric) 08/22/2006  . Influenza Whole 04/08/2010  . Influenza, High Dose Seasonal PF 05/11/2017  . Influenza,inj,Quad PF,6+ Mos 04/22/2014  . Influenza-Unspecified 04/23/2013, 05/02/2016, 04/06/2018  . Pneumococcal Conjugate-13 07/15/2013  . Pneumococcal Polysaccharide-23 05/31/2010, 08/20/2015  . Tdap 01/12/2018    TDAP status: Up to date  Flu Vaccine status: Declined, Education has been provided regarding the importance of this vaccine but patient still declined. Advised may receive this vaccine at local pharmacy or Health Dept. Aware to provide a copy of the vaccination record if obtained from local pharmacy or Health Dept. Verbalized acceptance and understanding.  Pneumococcal vaccine status: Up to date  Covid-19 vaccine status: Declined, Education has been provided regarding the importance of this vaccine but patient still declined. Advised may receive this vaccine at local pharmacy or Health Dept.or vaccine clinic. Aware to provide a copy of the vaccination record if obtained from local pharmacy or Health Dept. Verbalized acceptance and understanding.  Qualifies for Shingles Vaccine? No   Zostavax completed No   Shingrix Completed?: No.  Education has been provided regarding the importance of this vaccine. Patient has been advised to call insurance company to determine out of pocket expense if they have not yet received this vaccine. Advised may also receive vaccine at local pharmacy or Health Dept. Verbalized acceptance and understanding.  Screening Tests Health Maintenance  Topic Date Due  . PAP SMEAR-Modifier  10/19/2018  . MAMMOGRAM  12/28/2019  . OPHTHALMOLOGY EXAM  12/16/2020  . INFLUENZA VACCINE  02/21/2021  . HEMOGLOBIN A1C  04/30/2021  . FOOT EXAM  07/05/2021  . DEXA SCAN   08/02/2021  . TETANUS/TDAP  01/13/2028  . Hepatitis C Screening  Completed  . PNA vac Low Risk Adult  Completed  . HPV VACCINES  Aged Out  . COLONOSCOPY (Pts 45-42yr Insurance coverage will need to be confirmed)  Discontinued  . COVID-19 Vaccine  Discontinued    Health Maintenance  Health Maintenance Due  Topic Date Due  . PAP SMEAR-Modifier  10/19/2018  . MAMMOGRAM  12/28/2019    Colorectal cancer screening: No longer required.  patient declined  Mammogram status: Ordered 04/2020. Pt provided with contact info and advised to call to schedule appt.   Bone Density status: Ordered 10/2020. Pt provided with contact info and advised to call to schedule appt.  Lung Cancer Screening: (Low Dose CT Chest recommended if Age 73-80years, 30 pack-year currently smoking OR have quit w/in 15years.) does not qualify.   Additional Screening:  Hepatitis C Screening: does qualify; Completed 09/20/2016  Vision Screening: Recommended annual ophthalmology exams for early detection of glaucoma and other disorders of the eye. Is the patient up to date with their annual eye exam?  Yes  Who is the provider or what is the name of the office in which the patient attends annual eye exams? [facility] [place] is not established with a provider, would they like to be referred to a provider to establish care? No .   Dental Screening: Recommended annual dental exams for proper oral hygiene  Community Resource Referral / Chronic Care Management: CRR required this visit?  No   CCM required this visit?  No      Plan:     I have personally reviewed and noted the following in the patient's chart:   . Medical and social history . Use of alcohol, tobacco or illicit drugs  . Current medications and supplements . Functional ability and status . Nutritional status . Physical activity . Advanced directives . List of other physicians . Hospitalizations, surgeries, and ER visits in previous  12 months . Vitals . Screenings to include cognitive, depression, and falls . Referrals and appointments  In addition, I have reviewed and discussed with patient certain preventive protocols, quality metrics, and best practice recommendations. A written personalized care plan for preventive services as well as general preventive health recommendations were provided to patient.     ASandrea Hammond LPN   [id number]  Nurse Notes: None

## 2020-11-02 NOTE — Patient Instructions (Addendum)
Theresa Wilson , Thank you for taking time to come for your Medicare Wellness Visit. I appreciate your ongoing commitment to your health goals. Please review the following plan we discussed and let me know if I can assist you in the future.   Screening recommendations/referrals: Colonoscopy: Done 2010 - patient declines repeat Mammogram: Done 12/27/2017 - ordered 04/2020 - Advised patient to make appointment soon. Bone Density: Done 08/02/2016 - Repeat 5 years (ordered at last visit) - Advised patient to make appointment Recommended yearly ophthalmology/optometry visit for glaucoma screening and checkup Recommended yearly dental visit for hygiene and checkup  Vaccinations: Influenza vaccine: Declined - Due again in Fall 2022 Pneumococcal vaccine: Done 07/15/2013 & 08/20/2015 Tdap vaccine: Done 01/12/2018 - repeat in 10 years Shingles vaccine: Shingrix discussed. Please contact your pharmacy for coverage information.    Covid-19: patient declined  Advanced directives: Advance directive discussed with you today. I have provided a copy for you to complete at home and have notarized. Once this is complete please bring a copy in to our office so we can scan it into your chart.  Conditions/risks identified: Continue working on staying active and eating healthy to lose weight and get sugar under control.  Next appointment: Follow up in one year for your annual wellness visit    Preventive Care 65 Years and Older, Female Preventive care refers to lifestyle choices and visits with your health care provider that can promote health and wellness. What does preventive care include?  A yearly physical exam. This is also called an annual well check.  Dental exams once or twice a year.  Routine eye exams. Ask your health care provider how often you should have your eyes checked.  Personal lifestyle choices, including:  Daily care of your teeth and gums.  Regular physical activity.  Eating a healthy  diet.  Avoiding tobacco and drug use.  Limiting alcohol use.  Practicing safe sex.  Taking low-dose aspirin every day.  Taking vitamin and mineral supplements as recommended by your health care provider. What happens during an annual well check? The services and screenings done by your health care provider during your annual well check will depend on your age, overall health, lifestyle risk factors, and family history of disease. Counseling  Your health care provider may ask you questions about your:  Alcohol use.  Tobacco use.  Drug use.  Emotional well-being.  Home and relationship well-being.  Sexual activity.  Eating habits.  History of falls.  Memory and ability to understand (cognition).  Work and work Statistician.  Reproductive health. Screening  You may have the following tests or measurements:  Height, weight, and BMI.  Blood pressure.  Lipid and cholesterol levels. These may be checked every 5 years, or more frequently if you are over 10 years old.  Skin check.  Lung cancer screening. You may have this screening every year starting at age 50 if you have a 30-pack-year history of smoking and currently smoke or have quit within the past 15 years.  Fecal occult blood test (FOBT) of the stool. You may have this test every year starting at age 44.  Flexible sigmoidoscopy or colonoscopy. You may have a sigmoidoscopy every 5 years or a colonoscopy every 10 years starting at age 38.  Hepatitis C blood test.  Hepatitis B blood test.  Sexually transmitted disease (STD) testing.  Diabetes screening. This is done by checking your blood sugar (glucose) after you have not eaten for a while (fasting). You may have this  done every 1-3 years.  Bone density scan. This is done to screen for osteoporosis. You may have this done starting at age 79.  Mammogram. This may be done every 1-2 years. Talk to your health care provider about how often you should have  regular mammograms. Talk with your health care provider about your test results, treatment options, and if necessary, the need for more tests. Vaccines  Your health care provider may recommend certain vaccines, such as:  Influenza vaccine. This is recommended every year.  Tetanus, diphtheria, and acellular pertussis (Tdap, Td) vaccine. You may need a Td booster every 10 years.  Zoster vaccine. You may need this after age 4.  Pneumococcal 13-valent conjugate (PCV13) vaccine. One dose is recommended after age 62.  Pneumococcal polysaccharide (PPSV23) vaccine. One dose is recommended after age 41. Talk to your health care provider about which screenings and vaccines you need and how often you need them. This information is not intended to replace advice given to you by your health care provider. Make sure you discuss any questions you have with your health care provider. Document Released: 08/06/2015 Document Revised: 03/29/2016 Document Reviewed: 05/11/2015 Elsevier Interactive Patient Education  2017 [place] Prevention in the Home Falls can cause injuries. They can happen to people of all ages. There are many things you can do to make your home safe and to help prevent falls. What can I do on the outside of my home?  Regularly fix the edges of walkways and driveways and fix any cracks.  Remove anything that might make you trip as you walk through a door, such as a raised step or threshold.  Trim any bushes or trees on the path to your home.  Use bright outdoor lighting.  Clear any walking paths of anything that might make someone trip, such as rocks or tools.  Regularly check to see if handrails are loose or broken. Make sure that both sides of any steps have handrails.  Any raised decks and porches should have guardrails on the edges.  Have any leaves, snow, or ice cleared regularly.  Use sand or salt on walking paths during winter.  Clean up any spills in your  garage right away. This includes oil or grease spills. What can I do in the bathroom?  Use night lights.  Install grab bars by the toilet and in the tub and shower. Do not use towel bars as grab bars.  Use non-skid mats or decals in the tub or shower.  If you need to sit down in the shower, use a plastic, non-slip stool.  Keep the floor dry. Clean up any water that spills on the floor as soon as it happens.  Remove soap buildup in the tub or shower regularly.  Attach bath mats securely with double-sided non-slip rug tape.  Do not have throw rugs and other things on the floor that can make you trip. What can I do in the bedroom?  Use night lights.  Make sure that you have a light by your bed that is easy to reach.  Do not use any sheets or blankets that are too big for your bed. They should not hang down onto the floor.  Have a firm chair that has side arms. You can use this for support while you get dressed.  Do not have throw rugs and other things on the floor that can make you trip. What can I do in the kitchen?  Clean up any spills  right away.  Avoid walking on wet floors.  Keep items that you use a lot in easy-to-reach places.  If you need to reach something above you, use a strong step stool that has a grab bar.  Keep electrical cords out of the way.  Do not use floor polish or wax that makes floors slippery. If you must use wax, use non-skid floor wax.  Do not have throw rugs and other things on the floor that can make you trip. What can I do with my stairs?  Do not leave any items on the stairs.  Make sure that there are handrails on both sides of the stairs and use them. Fix handrails that are broken or loose. Make sure that handrails are as long as the stairways.  Check any carpeting to make sure that it is firmly attached to the stairs. Fix any carpet that is loose or worn.  Avoid having throw rugs at the top or bottom of the stairs. If you do have throw  rugs, attach them to the floor with carpet tape.  Make sure that you have a light switch at the top of the stairs and the bottom of the stairs. If you do not have them, ask someone to add them for you. What else can I do to help prevent falls?  Wear shoes that:  Do not have high heels.  Have rubber bottoms.  Are comfortable and fit you well.  Are closed at the toe. Do not wear sandals.  If you use a stepladder:  Make sure that it is fully opened. Do not climb a closed stepladder.  Make sure that both sides of the stepladder are locked into place.  Ask someone to hold it for you, if possible.  Clearly mark and make sure that you can see:  Any grab bars or handrails.  First and last steps.  Where the edge of each step is.  Use tools that help you move around (mobility aids) if they are needed. These include:  Canes.  Walkers.  Scooters.  Crutches.  Turn on the lights when you go into a dark area. Replace any light bulbs as soon as they burn out.  Set up your furniture so you have a clear path. Avoid moving your furniture around.  If any of your floors are uneven, fix them.  If there are any pets around you, be aware of where they are.  Review your medicines with your doctor. Some medicines can make you feel dizzy. This can increase your chance of falling. Ask your doctor what other things that you can do to help prevent falls. This information is not intended to replace advice given to you by your health care provider. Make sure you discuss any questions you have with your health care provider. Document Released: 05/06/2009 Document Revised: 12/16/2015 Document Reviewed: 08/14/2014 Elsevier Interactive Patient Education  2017 [place]

## 2020-11-17 ENCOUNTER — Telehealth: Payer: Self-pay

## 2020-11-19 ENCOUNTER — Other Ambulatory Visit: Payer: Medicare Other

## 2020-12-03 ENCOUNTER — Other Ambulatory Visit: Payer: Self-pay

## 2020-12-03 ENCOUNTER — Ambulatory Visit (INDEPENDENT_AMBULATORY_CARE_PROVIDER_SITE_OTHER): Payer: Medicare Other

## 2020-12-03 DIAGNOSIS — Z780 Asymptomatic menopausal state: Secondary | ICD-10-CM

## 2021-01-30 ENCOUNTER — Other Ambulatory Visit: Payer: Self-pay | Admitting: Family Medicine

## 2021-01-30 DIAGNOSIS — M62838 Other muscle spasm: Secondary | ICD-10-CM

## 2021-01-30 DIAGNOSIS — E1165 Type 2 diabetes mellitus with hyperglycemia: Secondary | ICD-10-CM

## 2021-02-09 ENCOUNTER — Other Ambulatory Visit: Payer: Self-pay | Admitting: Family Medicine

## 2021-06-08 ENCOUNTER — Other Ambulatory Visit: Payer: Self-pay | Admitting: Family Medicine

## 2021-06-08 DIAGNOSIS — E1165 Type 2 diabetes mellitus with hyperglycemia: Secondary | ICD-10-CM

## 2021-06-30 ENCOUNTER — Other Ambulatory Visit: Payer: Self-pay | Admitting: Family Medicine

## 2021-07-21 NOTE — Progress Notes (Signed)
Error

## 2021-07-25 ENCOUNTER — Other Ambulatory Visit: Payer: Self-pay | Admitting: Family Medicine

## 2021-11-03 ENCOUNTER — Ambulatory Visit: Payer: Medicare Other
